# Patient Record
Sex: Female | Born: 1985 | Race: White | Hispanic: No | Marital: Married | Smoking: Never smoker
Health system: Southern US, Community
[De-identification: ages and names within clinical notes are randomized; demographics above are authoritative.]

## PROBLEM LIST (undated history)

## (undated) DIAGNOSIS — Z803 Family history of malignant neoplasm of breast: Secondary | ICD-10-CM

## (undated) DIAGNOSIS — F32A Depression, unspecified: Secondary | ICD-10-CM

## (undated) DIAGNOSIS — F419 Anxiety disorder, unspecified: Secondary | ICD-10-CM

## (undated) DIAGNOSIS — J45909 Unspecified asthma, uncomplicated: Secondary | ICD-10-CM

## (undated) DIAGNOSIS — Z8481 Family history of carrier of genetic disease: Secondary | ICD-10-CM

## (undated) DIAGNOSIS — I493 Ventricular premature depolarization: Secondary | ICD-10-CM

## (undated) DIAGNOSIS — G47419 Narcolepsy without cataplexy: Secondary | ICD-10-CM

## (undated) HISTORY — DX: Family history of malignant neoplasm of breast: Z80.3

## (undated) HISTORY — DX: Family history of carrier of genetic disease: Z84.81

## (undated) HISTORY — DX: Ventricular premature depolarization: I49.3

## (undated) HISTORY — DX: Unspecified asthma, uncomplicated: J45.909

## (undated) HISTORY — DX: Narcolepsy without cataplexy: G47.419

## (undated) HISTORY — PX: WISDOM TOOTH EXTRACTION: SHX21

---

## 2011-03-01 DIAGNOSIS — L409 Psoriasis, unspecified: Secondary | ICD-10-CM | POA: Insufficient documentation

## 2012-02-05 DIAGNOSIS — F32A Depression, unspecified: Secondary | ICD-10-CM | POA: Insufficient documentation

## 2012-02-05 DIAGNOSIS — F419 Anxiety disorder, unspecified: Secondary | ICD-10-CM | POA: Insufficient documentation

## 2013-07-10 ENCOUNTER — Ambulatory Visit: Payer: Self-pay

## 2013-07-10 LAB — URINALYSIS, COMPLETE
BILIRUBIN, UR: NEGATIVE
Glucose,UR: NEGATIVE mg/dL (ref 0–75)
KETONE: NEGATIVE
LEUKOCYTE ESTERASE: NEGATIVE
Nitrite: NEGATIVE
Ph: 7 (ref 4.5–8.0)
Protein: NEGATIVE
Specific Gravity: 1.005 (ref 1.003–1.030)
Squamous Epithelial: NONE SEEN
WBC UR: NONE SEEN /HPF (ref 0–5)

## 2013-07-10 LAB — WET PREP, GENITAL

## 2013-07-10 LAB — PREGNANCY, URINE: Pregnancy Test, Urine: NEGATIVE m[IU]/mL

## 2013-07-11 LAB — GC/CHLAMYDIA PROBE AMP

## 2014-11-03 ENCOUNTER — Encounter: Payer: Self-pay | Admitting: Obstetrics and Gynecology

## 2014-11-03 ENCOUNTER — Other Ambulatory Visit: Payer: Self-pay | Admitting: *Deleted

## 2014-11-03 ENCOUNTER — Ambulatory Visit (INDEPENDENT_AMBULATORY_CARE_PROVIDER_SITE_OTHER): Payer: 59 | Admitting: Obstetrics and Gynecology

## 2014-11-03 ENCOUNTER — Telehealth: Payer: Self-pay | Admitting: Obstetrics and Gynecology

## 2014-11-03 VITALS — BP 128/84 | HR 73 | Ht 66.0 in | Wt 128.0 lb

## 2014-11-03 DIAGNOSIS — B373 Candidiasis of vulva and vagina: Secondary | ICD-10-CM

## 2014-11-03 DIAGNOSIS — B3731 Acute candidiasis of vulva and vagina: Secondary | ICD-10-CM

## 2014-11-03 DIAGNOSIS — R300 Dysuria: Secondary | ICD-10-CM

## 2014-11-03 LAB — POCT URINALYSIS DIPSTICK
Bilirubin, UA: NEGATIVE
Glucose, UA: NEGATIVE
Ketones, UA: NEGATIVE
Leukocytes, UA: NEGATIVE
Nitrite, UA: NEGATIVE
Protein, UA: NEGATIVE
RBC UA: NEGATIVE
Spec Grav, UA: 1.01
UROBILINOGEN UA: 0.2
pH, UA: 7

## 2014-11-03 MED ORDER — FLUCONAZOLE 150 MG PO TABS: 150.0000 mg | ORAL_TABLET | Freq: Once | ORAL | Status: DC

## 2014-11-03 MED ORDER — NORETHINDRONE-ETH ESTRADIOL 1-35 MG-MCG PO TABS: 1.0000 | ORAL_TABLET | Freq: Every day | ORAL | Status: DC

## 2014-11-03 NOTE — Telephone Encounter (Signed)
PT WAS HERE EARLIER AND FORGOT TO MENTION SHE NEEDS A REFILL ON HER BC.

## 2014-11-03 NOTE — Patient Instructions (Signed)
Thank you for enrolling in MyChart. Please follow the instructions below to securely access your online medical record. MyChart allows you to send messages to your doctor, view your test results, renew your prescriptions, schedule appointments, and more.  How Do I Sign Up? 1. In your Internet browser, go to [email or web address] 2. Click on the New  User? link in the Sign In box.  3. Enter your MyChart Access Code exactly as it appears below. You will not need to use this code after you have completed the sign-up process. If you do not sign up before the expiration date, you must request a new code. MyChart Access Code: [email or web address] Expires: 01/02/2015 11:57 AM  4. Enter the last four digits of your Social Security Number (xxxx) and Date of Birth (mm/dd/yyyy) as indicated and click Next. You will be taken to the next sign-up page. 5. Create a MyChart ID. This will be your MyChart login ID and cannot be changed, so think of one that is secure and easy to remember. 6. Create a MyChart password. You can change your password at any time. 7. Enter your Password Reset Question and Answer and click Next. This can be used at a later time if you forget your password.  8. Select your communication preference, and if applicable enter your e-mail address. You will receive e-mail notification when new information is available in MyChart by choosing to receive e-mail notifications and filling in your e-mail. 9. Click Sign In. You can now view your medical record.   Additional Information If you have questions, you can email [email or web address] or call [phone] to talk to our MyChart staff. Remember, MyChart is NOT to be used for urgent needs. For medical emergencies, dial 911. Monilial Vaginitis Vaginitis in a soreness, swelling and redness (inflammation) of the vagina and vulva. Monilial vaginitis is not a sexually transmitted infection. CAUSES  Yeast vaginitis is  caused by yeast (candida) that is normally found in your vagina. With a yeast infection, the candida has overgrown in number to a point that upsets the chemical balance. SYMPTOMS   White, thick vaginal discharge.  Swelling, itching, redness and irritation of the vagina and possibly the lips of the vagina (vulva).  Burning or painful urination.  Painful intercourse. DIAGNOSIS  Things that may contribute to monilial vaginitis are:  Postmenopausal and virginal states.  Pregnancy.  Infections.  Being tired, sick or stressed, especially if you had monilial vaginitis in the past.  Diabetes. Good control will help lower the chance.  Birth control pills.  Tight fitting garments.  Using bubble bath, feminine sprays, douches or deodorant tampons.  Taking certain medications that kill germs (antibiotics).  Sporadic recurrence can occur if you become ill. TREATMENT  Your caregiver will give you medication.  There are several kinds of anti monilial vaginal creams and suppositories specific for monilial vaginitis. For recurrent yeast infections, use a suppository or cream in the vagina 2 times a week, or as directed.  Anti-monilial or steroid cream for the itching or irritation of the vulva may also be used. Get your caregiver's permission.  Painting the vagina with methylene blue solution may help if the monilial cream does not work.  Eating yogurt may help prevent monilial vaginitis. HOME CARE INSTRUCTIONS   Finish all medication as prescribed.  Do not have sex until treatment is completed or after your caregiver tells you it is okay.  Take warm sitz baths.  Do not douche.  Do not use tampons,  especially scented ones.  Wear cotton underwear.  Avoid tight pants and panty hose.  Tell your sexual partner that you have a yeast infection. They should go to their caregiver if they have symptoms such as mild rash or itching.  Your sexual partner should be treated as well if  your infection is difficult to eliminate.  Practice safer sex. Use condoms.  Some vaginal medications cause latex condoms to fail. Vaginal medications that harm condoms are:  Cleocin cream.  Butoconazole (Femstat).  Terconazole (Terazol) vaginal suppository.  Miconazole (Monistat) (may be purchased over the counter). SEEK MEDICAL CARE IF:   You have a temperature by mouth above 102 F (38.9 C).  The infection is getting worse after 2 days of treatment.  The infection is not getting better after 3 days of treatment.  You develop blisters in or around your vagina.  You develop vaginal bleeding, and it is not your menstrual period.  You have pain when you urinate.  You develop intestinal problems.  You have pain with sexual intercourse. Document Released: 11/15/2004 Document Revised: 04/30/2011 Document Reviewed: 07/30/2008 [facility] Patient Information 2015 [place], [place]. This information is not intended to replace advice given to you by your health care provider. Make sure you discuss any questions you have with your health care provider.

## 2014-11-03 NOTE — Progress Notes (Signed)
Subjective:     Patient ID: Tanya Glenn, female   DOB: 1985-09-09, 29 y.o.   MRN: [id number]  HPI Recurrent vaginal burning x 1 month, had a yeas infection in the past but this feels different, not sexually active in 1 year  Review of Systems Vaginal burning with no increased discharge    Objective:   Physical Exam A&Ox4 Well groomed thin female in no current distress Pelvic exam: VULVA: normal appearing vulva with no masses, tenderness or lesions, vulvar erythema in labial folds, VAGINA: normal appearing vagina with normal color and discharge, no lesions, vaginal erythema throughout, CERVIX: cervical discharge present - creamy and scant, nulliparous os. Microscopic wet-mount exam shows negative for pathogens, normal epithelial cells, lactobacilli.     Assessment:     Yeast infection     Plan:     Diflucan  po now To schedule AE in 4-6 weeks  Kenzlei Runions Ines Bloomer, CNM

## 2014-11-03 NOTE — Telephone Encounter (Signed)
Sent rx to pharmacy

## 2014-11-12 ENCOUNTER — Telehealth: Payer: Self-pay | Admitting: Obstetrics and Gynecology

## 2014-11-12 NOTE — Telephone Encounter (Signed)
Called pt notified what MNB would like to do, she will be by today after 4:15

## 2014-11-12 NOTE — Telephone Encounter (Signed)
Have her stop by and lets get a nuswab and send it in- can have pt self obtain- explain that I am looking for yeast that is resistant to the medication

## 2014-11-12 NOTE — Telephone Encounter (Signed)
Pt has taken medication for yeast infection and she said she is still burning. She said she knows something is not right.

## 2014-11-12 NOTE — Telephone Encounter (Signed)
pls advise

## 2014-11-15 ENCOUNTER — Other Ambulatory Visit: Payer: Self-pay | Admitting: Obstetrics and Gynecology

## 2014-11-18 ENCOUNTER — Encounter: Payer: Self-pay | Admitting: Obstetrics and Gynecology

## 2014-11-23 ENCOUNTER — Telehealth: Payer: Self-pay | Admitting: *Deleted

## 2014-11-23 NOTE — Telephone Encounter (Signed)
Notified pt of results 

## 2014-11-23 NOTE — Telephone Encounter (Signed)
-----   Message from Ulyses Amor, [place] sent at 11/23/2014  2:07 PM EDT ----- Please let her know Nuswab was negative for yeast and BV, I believe symptoms are related to other cause- like reaction to toilet paper, soaps, excessive wiping, allergies, etc- can be hard to tell at times.

## 2015-01-05 ENCOUNTER — Other Ambulatory Visit: Payer: Self-pay | Admitting: Obstetrics and Gynecology

## 2015-01-05 ENCOUNTER — Ambulatory Visit (INDEPENDENT_AMBULATORY_CARE_PROVIDER_SITE_OTHER): Payer: 59 | Admitting: Obstetrics and Gynecology

## 2015-01-05 ENCOUNTER — Encounter: Payer: Self-pay | Admitting: Obstetrics and Gynecology

## 2015-01-05 VITALS — BP 122/82 | HR 77 | Ht 66.5 in | Wt 125.5 lb

## 2015-01-05 DIAGNOSIS — R61 Generalized hyperhidrosis: Secondary | ICD-10-CM

## 2015-01-05 DIAGNOSIS — Z01419 Encounter for gynecological examination (general) (routine) without abnormal findings: Secondary | ICD-10-CM

## 2015-01-05 MED ORDER — CLOBETASOL PROPIONATE 0.05 % EX SHAM: 1.0000 "application " | MEDICATED_SHAMPOO | CUTANEOUS | Status: DC | PRN

## 2015-01-05 NOTE — Progress Notes (Signed)
Subjective:   Tanya NakaiSheema Glenn is a 29 y.o. No obstetric history on file. Caucasian female here for a routine well-woman exam.  Patient's last menstrual period was 10/22/2014.    Current complaints: excessive sweating in groin and under arms x 1 year , H/O low TSH on screening      Does need & desire labs  Social History: Sexual: heterosexual Marital Status: single Living situation: alone Occupation: Teacher, early years/prepharmacist at [employer] Tobacco/alcohol: no tobacco use Illicit drugs: no history of illicit drug use  The following portions of the patient's history were reviewed and updated as appropriate: allergies, current medications, past family history, past medical history, past social history, past surgical history and problem list.  Past Medical History Past Medical History  Diagnosis Date  . Narcolepsy   . Asthma     Past Surgical History History reviewed. No pertinent past surgical history.  Gynecologic History No obstetric history on file.  Patient's last menstrual period was 10/22/2014. Contraception: abstinence and OCP (estrogen/progesterone) Last Pap: 2015. Results were: normal  Obstetric History OB History  No data available    Current Medications Current Outpatient Prescriptions on File Prior to Visit  Medication Sig Dispense Refill  . albuterol (PROVENTIL HFA;VENTOLIN HFA) 108 (90 BASE) MCG/ACT inhaler Inhale into the lungs every 6 (six) hours as needed for wheezing or shortness of breath.    . amphetamine-dextroamphetamine (ADDERALL XR) 5 MG 24 hr capsule Take 1 capsule by mouth daily.    . norethindrone-ethinyl estradiol 1/35 (ORTHO-NOVUM, NORTREL,CYCLAFEM) tablet Take 1 tablet by mouth daily. 1 Package 3  . Sodium Oxybate (XYREM) 500 MG/ML SOLN Take by mouth 2 (two) times daily.    Marland Kitchen. dexmethylphenidate (FOCALIN) 5 MG tablet Take 1 tablet by mouth as needed.    . fluconazole (DIFLUCAN) 150 MG tablet Take 1 tablet (150 mg total) by mouth once. Can take additional dose three days  later if symptoms persist (Patient not taking: Reported on 01/05/2015) 1 tablet 3   No current facility-administered medications on file prior to visit.    Review of Systems Patient denies any headaches, blurred vision, shortness of breath, chest pain, abdominal pain, problems with bowel movements, urination, or intercourse.  Objective:  BP 122/82 mmHg  Pulse 77  Ht 5' 6.5" (1.689 m)  Wt 125 lb 8 oz (56.926 kg)  BMI 19.95 kg/m2  LMP 10/22/2014 Physical Exam  General:  Well developed, well nourished, no acute distress. She is alert and oriented x3. Skin:  Warm and dry Neck:  Midline trachea, no thyromegaly or nodules Cardiovascular: Regular rate and rhythm, no murmur heard Lungs:  Effort normal, all lung fields clear to auscultation bilaterally Breasts:  No dominant palpable mass, retraction, or nipple discharge Abdomen:  Soft, non tender, no hepatosplenomegaly or masses Pelvic:  External genitalia is normal in appearance.  The vagina is normal in appearance. The cervix is bulbous, no CMT.  Thin prep pap is done with HR HPV cotesting. Uterus is felt to be normal size, shape, and contour.  No adnexal masses or tenderness noted. Extremities:  No swelling or varicosities noted Psych:  She has a normal mood and affect  Assessment:   Healthy well-woman exam; excessive sweating, ADD, Narcolepsy; OCP user; recurrent vaginal yeast  Plan:  Pap and labs obtained F/U 1 Year for AE, or sooner if needed  Melody Elissa LovettN Burr, CNM

## 2015-01-05 NOTE — Patient Instructions (Signed)
Place annual gynecologic exam patient instructions here.

## 2015-01-06 LAB — COMPREHENSIVE METABOLIC PANEL
A/G RATIO: 1.8 (ref 1.1–2.5)
ALBUMIN: 4.4 g/dL (ref 3.5–5.5)
ALT: 15 IU/L (ref 0–32)
AST: 20 IU/L (ref 0–40)
Alkaline Phosphatase: 45 IU/L (ref 39–117)
BILIRUBIN TOTAL: 0.2 mg/dL (ref 0.0–1.2)
BUN / CREAT RATIO: 9 (ref 8–20)
BUN: 9 mg/dL (ref 6–20)
CO2: 24 mmol/L (ref 18–29)
Calcium: 9.5 mg/dL (ref 8.7–10.2)
Chloride: 99 mmol/L (ref 97–106)
Creatinine, Ser: 0.95 mg/dL (ref 0.57–1.00)
GFR, EST AFRICAN AMERICAN: 93 mL/min/{1.73_m2} (ref >59)
GFR, EST NON AFRICAN AMERICAN: 81 mL/min/{1.73_m2} (ref >59)
Globulin, Total: 2.5 g/dL (ref 1.5–4.5)
Glucose: 89 mg/dL (ref 65–99)
POTASSIUM: 3.9 mmol/L (ref 3.5–5.2)
Sodium: 138 mmol/L (ref 136–144)
Total Protein: 6.9 g/dL (ref 6.0–8.5)

## 2015-01-06 LAB — LIPID PANEL
CHOLESTEROL TOTAL: 138 mg/dL (ref 100–199)
Chol/HDL Ratio: 2.2 ratio units (ref 0.0–4.4)
HDL: 63 mg/dL (ref >39)
LDL CALC: 66 mg/dL (ref 0–99)
Triglycerides: 47 mg/dL (ref 0–149)
VLDL CHOLESTEROL CAL: 9 mg/dL (ref 5–40)

## 2015-01-06 LAB — THYROID PANEL WITH TSH
Free Thyroxine Index: 2.9 (ref 1.2–4.9)
T3 UPTAKE RATIO: 25 % (ref 24–39)
T4 TOTAL: 11.4 ug/dL (ref 4.5–12.0)
TSH: 1.54 u[IU]/mL (ref 0.450–4.500)

## 2015-01-07 LAB — CYTOLOGY - PAP

## 2015-02-15 ENCOUNTER — Other Ambulatory Visit: Payer: Self-pay | Admitting: Obstetrics and Gynecology

## 2015-03-30 ENCOUNTER — Ambulatory Visit (INDEPENDENT_AMBULATORY_CARE_PROVIDER_SITE_OTHER): Payer: 59 | Admitting: Obstetrics and Gynecology

## 2015-03-30 VITALS — BP 128/93 | HR 69 | Wt 126.2 lb

## 2015-03-30 DIAGNOSIS — R300 Dysuria: Secondary | ICD-10-CM | POA: Diagnosis not present

## 2015-03-30 LAB — POCT URINALYSIS DIPSTICK
Bilirubin, UA: NEGATIVE
Blood, UA: NEGATIVE
Glucose, UA: NEGATIVE
Ketones, UA: NEGATIVE
LEUKOCYTES UA: NEGATIVE
NITRITE UA: NEGATIVE
PH UA: 7.5
PROTEIN UA: NEGATIVE
Spec Grav, UA: 1.01
Urobilinogen, UA: NEGATIVE

## 2015-03-30 NOTE — Progress Notes (Signed)
Patient ID: Tanya Glenn, female   DOB: [birth year], 30 y.o.   MRN: [id number] Pt workin. She states she feels supra-pubic pressure and twitching (like spasm) and burns when urinates. No vaginal discharge. Pt urinalysis is unremarkable but will send for culture. Urogesic tab. (1) to be taken 4x day as needed. To contact office on Friday for urine culture results if she has not heard anything from office.

## 2015-04-02 LAB — URINE CULTURE: Organism ID, Bacteria: NO GROWTH

## 2015-04-09 DIAGNOSIS — N760 Acute vaginitis: Secondary | ICD-10-CM | POA: Diagnosis not present

## 2015-04-09 DIAGNOSIS — B9689 Other specified bacterial agents as the cause of diseases classified elsewhere: Secondary | ICD-10-CM | POA: Diagnosis not present

## 2015-04-09 DIAGNOSIS — N898 Other specified noninflammatory disorders of vagina: Secondary | ICD-10-CM | POA: Diagnosis not present

## 2015-06-21 DIAGNOSIS — G47419 Narcolepsy without cataplexy: Secondary | ICD-10-CM | POA: Diagnosis not present

## 2015-07-04 ENCOUNTER — Other Ambulatory Visit: Payer: Self-pay | Admitting: Obstetrics and Gynecology

## 2015-07-04 DIAGNOSIS — B373 Candidiasis of vulva and vagina: Secondary | ICD-10-CM | POA: Diagnosis not present

## 2015-07-07 ENCOUNTER — Telehealth: Payer: Self-pay | Admitting: Obstetrics and Gynecology

## 2015-07-07 NOTE — Telephone Encounter (Signed)
Patient called stating she is waiting on a call from you.Thanks

## 2015-07-07 NOTE — Telephone Encounter (Signed)
Results are not back yet.  

## 2015-07-07 NOTE — Telephone Encounter (Signed)
Pt came in on 07/04/15 did self obtained swab do you see results???

## 2015-08-09 ENCOUNTER — Other Ambulatory Visit: Payer: Self-pay | Admitting: Obstetrics and Gynecology

## 2015-08-11 ENCOUNTER — Telehealth: Payer: Self-pay | Admitting: Obstetrics and Gynecology

## 2015-08-11 NOTE — Telephone Encounter (Signed)
PT SAID SHE IS STILL BURNING AND HURTING IN HER VAGINA. sHE SAID SHE GOT A SWAB ABOUT A MONTH AGO AND SHE IS VERY WORRIED SOMETHING IS WRONG. [facility]

## 2015-08-11 NOTE — Telephone Encounter (Signed)
Recommend coconut oil applied externally after showering, OK to put on call back list for workin appt or can see Dr Valentino Saxonherry for second opinion

## 2015-08-11 NOTE — Telephone Encounter (Signed)
Mel what would you like to do with this pt???

## 2015-08-16 ENCOUNTER — Encounter: Payer: Self-pay | Admitting: Obstetrics and Gynecology

## 2015-08-16 ENCOUNTER — Ambulatory Visit (INDEPENDENT_AMBULATORY_CARE_PROVIDER_SITE_OTHER): Payer: 59 | Admitting: Obstetrics and Gynecology

## 2015-08-16 VITALS — BP 141/82 | HR 80 | Ht 66.0 in | Wt 125.7 lb

## 2015-08-16 DIAGNOSIS — N9489 Other specified conditions associated with female genital organs and menstrual cycle: Secondary | ICD-10-CM

## 2015-08-16 DIAGNOSIS — B3731 Acute candidiasis of vulva and vagina: Secondary | ICD-10-CM

## 2015-08-16 DIAGNOSIS — N760 Acute vaginitis: Secondary | ICD-10-CM

## 2015-08-16 DIAGNOSIS — B373 Candidiasis of vulva and vagina: Secondary | ICD-10-CM

## 2015-08-16 MED ORDER — FLUCONAZOLE 150 MG PO TABS: 150.0000 mg | ORAL_TABLET | Freq: Once | ORAL | Status: DC

## 2015-08-16 NOTE — Progress Notes (Signed)
Subjective:     Patient ID: Tanya NakaiSheema Glenn, female   DOB: 06/16/1985, 30 y.o.   MRN: [id number]  HPI Ongoing sporadic and at times continuous vaginal burning, worse after wiping, sex and with tight clothing, at times can't wear underwear at all. Also reports return on menses in April, and is having them monthly now. Last treated yeast infection 3 weeks ago with OTC monistat which made things worse. Has went back and forth between yeast and BV. Very frustrated, as the burning is daily for months to some degree.   Review of Systems See above    Objective:   Physical Exam A&O x4 Well groomed female in mild distress-anxious Blood pressure 141/82, pulse 80, height 5\' 6"  (1.676 m), weight 125 lb 11.2 oz (57.017 kg), last menstrual period 07/18/2015. Pelvic exam: VULVA: vulvar tenderness and posterior introitus and entire hymen, vulvar hypopigmentation spotted around, VAGINA: pale with little moisture except thick discharge at cervix, WET MOUNT done - results: negative for pathogens, normal epithelial cells, lactobacilli, pH < 4.5.    Assessment:     Vulvar burning Yeast vaginitis Estrogen deficiency     Plan:     Diflucan 150mg  now, and repeat in 1 week if needed Stop OCPs Estrace cream externally nightly x1 month OK to continue with coconut oil as needed.  RTC 3 weeks for colpo  Harlow MaresMelody Shambley, CNM

## 2015-08-16 NOTE — Telephone Encounter (Signed)
Pt came in 08/16/15-ac

## 2015-09-07 ENCOUNTER — Telehealth: Payer: Self-pay | Admitting: Obstetrics and Gynecology

## 2015-09-07 NOTE — Telephone Encounter (Signed)
Pt called and she needs to have a discussion about her appt on Friday, she has a full blown yeast infection and she thinks that she doesn't need to have any infectionwhen she comes in so that you can get a good look at the lining, she wanted a call back to know what she needs to do.

## 2015-09-07 NOTE — Telephone Encounter (Signed)
I agree we need to reschedule appt until she is done with medication for 3 days.

## 2015-09-08 NOTE — Telephone Encounter (Signed)
Spoke with pt

## 2015-09-09 ENCOUNTER — Other Ambulatory Visit: Payer: Self-pay | Admitting: Obstetrics and Gynecology

## 2015-09-09 ENCOUNTER — Encounter: Payer: Self-pay | Admitting: Obstetrics and Gynecology

## 2015-09-09 ENCOUNTER — Ambulatory Visit (INDEPENDENT_AMBULATORY_CARE_PROVIDER_SITE_OTHER): Payer: 59 | Admitting: Obstetrics and Gynecology

## 2015-09-09 VITALS — BP 129/86 | HR 69 | Ht 66.0 in | Wt 124.1 lb

## 2015-09-09 DIAGNOSIS — B3731 Acute candidiasis of vulva and vagina: Secondary | ICD-10-CM

## 2015-09-09 DIAGNOSIS — Z113 Encounter for screening for infections with a predominantly sexual mode of transmission: Secondary | ICD-10-CM | POA: Diagnosis not present

## 2015-09-09 DIAGNOSIS — B373 Candidiasis of vulva and vagina: Secondary | ICD-10-CM | POA: Diagnosis not present

## 2015-09-09 NOTE — Progress Notes (Signed)
Subjective:     Patient ID: Tanya NakaiSheema Rauda, female   DOB: 06/02/1985, 30 y.o.   MRN: [id number]  HPI Reports yeast infection returned 5 days ago, with burning internally and externally, increased thick white d/c, no odor, no spotting. Has not taken diflucan since last week. Did have intercourse last week.  Review of Systems See above    Objective:   Physical Exam A&O x4 Well groomed thin female in no distress Blood pressure 129/86, pulse 69, height 5\' 6"  (1.676 m), weight 124 lb 1.6 oz (56.291 kg), last menstrual period 08/29/2015. Pelvic exam: normal external genitalia, vulva, vagina, cervix, uterus and adnexa, CERVIX: normal appearing cervix without discharge or lesions, cervical discharge present - white and thick, WET MOUNT done - results: negative for pathogens, normal epithelial cells, lactobacilli, vaginal pH is 4.5.NuSwab obtained and sent in    Assessment:     Vaginal yeast infection- recurrent     Plan:     gention violet dye applied to entire vagina, vulva and perineum-tolerated well RTC 1 week for next treatment.  Girl Schissler HoldenvilleShambley, CNM

## 2015-09-13 ENCOUNTER — Telehealth: Payer: Self-pay | Admitting: Vascular Surgery

## 2015-09-13 NOTE — Telephone Encounter (Signed)
Patient called 7/25 to schedule an appointment for a self referral for varicose veins. I was able to schedule the patient for 8/7.

## 2015-09-15 ENCOUNTER — Encounter: Payer: Self-pay | Admitting: Obstetrics and Gynecology

## 2015-09-16 ENCOUNTER — Encounter: Payer: Self-pay | Admitting: Obstetrics and Gynecology

## 2015-09-16 ENCOUNTER — Ambulatory Visit (INDEPENDENT_AMBULATORY_CARE_PROVIDER_SITE_OTHER): Payer: 59 | Admitting: Obstetrics and Gynecology

## 2015-09-16 VITALS — BP 120/77 | HR 86 | Wt 124.8 lb

## 2015-09-16 DIAGNOSIS — B379 Candidiasis, unspecified: Secondary | ICD-10-CM | POA: Diagnosis not present

## 2015-09-16 NOTE — Progress Notes (Signed)
Subjective:     Patient ID: Tanya Glenn, female   DOB: 05-Mar-1985, 30 y.o.   MRN: [id number]  HPI Seen 1 week ago for recurrent yeast infections and vulvar burning- treated with gention violet dye- here for second treatment  Review of Systems States mild internal itching 2 days ago, otherwise feeling better    Objective:   Physical Exam A&O x4 Well groomed female in no distress Blood pressure 120/77, pulse 86, weight 124 lb 12.8 oz (56.6 kg), last menstrual period 08/29/2015. Pelvic exam: normal external genitalia, vulva, vagina, cervix, uterus and adnexa. 80% improvement of tissue and scant clear d/c noted.    Assessment:     Yeast treatment    Plan:     gention violet dye applied (second treatment) to vaginal and cervix without difficulty RTC in 11 days for recheck.  Melody [place], CNM

## 2015-09-20 ENCOUNTER — Encounter: Payer: 59 | Admitting: Obstetrics and Gynecology

## 2015-09-21 ENCOUNTER — Other Ambulatory Visit: Payer: Self-pay | Admitting: *Deleted

## 2015-09-21 DIAGNOSIS — I83811 Varicose veins of right lower extremities with pain: Secondary | ICD-10-CM

## 2015-09-22 ENCOUNTER — Encounter: Payer: Self-pay | Admitting: Vascular Surgery

## 2015-09-26 ENCOUNTER — Ambulatory Visit (HOSPITAL_COMMUNITY)
Admission: RE | Admit: 2015-09-26 | Discharge: 2015-09-26 | Disposition: A | Discharge status: Home / Self Care | Payer: 59 | Source: Ambulatory Visit | Attending: Vascular Surgery | Admitting: Vascular Surgery

## 2015-09-26 ENCOUNTER — Ambulatory Visit (INDEPENDENT_AMBULATORY_CARE_PROVIDER_SITE_OTHER): Payer: 59 | Admitting: Vascular Surgery

## 2015-09-26 ENCOUNTER — Encounter: Payer: Self-pay | Admitting: Vascular Surgery

## 2015-09-26 VITALS — BP 111/87 | HR 76 | Ht 66.0 in | Wt 119.5 lb

## 2015-09-26 DIAGNOSIS — I83811 Varicose veins of right lower extremities with pain: Secondary | ICD-10-CM | POA: Insufficient documentation

## 2015-09-26 DIAGNOSIS — M79604 Pain in right leg: Secondary | ICD-10-CM | POA: Insufficient documentation

## 2015-09-26 NOTE — Progress Notes (Signed)
Subjective:     Patient ID: Tanya Glenn, female   DOB: 01/23/1986, 30 y.o.   MRN: [id number]  HPI this 30 year old female is self-referred because of right leg pain below the knee and occasional numbness. 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 She states she got good Results with relief of her discomfort in the posterior thigh area. About 6 months ago she developed pain in the right medial calf and states that this bulges significantly at times. She states that she occasionally will have some numbness in the right foot associated with this particularly at night. She states she is unable to stand in the shower because of the discomfort. It is affecting her ability to work she states. She has no history of back pain or nerve compression syndrome. She denies DVT thrombophlebitis stasis ulcers or bleeding. She does were elastic compression stockings at work with some help.  Past Medical History:  Diagnosis Date  . Asthma   . Narcolepsy     Social History  Substance Use Topics  . Smoking status: Never Smoker  . Smokeless tobacco: Never Used  . Alcohol use Yes    Family History  Problem Relation Age of Onset  . Heart disease Maternal Grandmother     Allergies  Allergen Reactions  . Sulfa Antibiotics Hives     Current Outpatient Prescriptions:  .  albuterol (PROVENTIL HFA;VENTOLIN HFA) 108 (90 BASE) MCG/ACT inhaler, Inhale into the lungs every 6 (six) hours as needed for wheezing or shortness of breath., Disp: , Rfl:  .  Clobetasol Propionate 0.05 % shampoo, Apply 1 application topically as needed., Disp: 118 mL, Rfl: 3 .  dexmethylphenidate (FOCALIN) 5 MG tablet, Take 5 mg by mouth daily., Disp: , Rfl:  .  Sodium Oxybate (XYREM) 500 MG/ML SOLN, Take by mouth 2 (two) times daily., Disp: , Rfl:  .   amphetamine-dextroamphetamine (ADDERALL XR) 5 MG 24 hr capsule, Take 1 capsule by mouth daily., Disp: , Rfl:  .  dexmethylphenidate (FOCALIN) 5 MG tablet, Take 1 tablet by mouth as needed., Disp: , Rfl:  .  fluconazole (DIFLUCAN) 150 MG tablet, Take 1 tablet (150 mg total) by mouth once. Can take additional dose three days later if symptoms persist (Patient not taking: Reported on 09/16/2015), Disp: 1 tablet, Rfl: 3  Vitals:   09/26/15 1330  BP: 111/87  Pulse: 76  SpO2: 100%  Weight: 119 lb 8 oz (54.2 kg)  Height:  (1.676 m)    Body mass index is 19.29 kg/m.          Review of Systems   denies chest pain, dyspnea on exertion, PND, orthopnea. Patient has narcolepsy and is on medications to help her sleep at night. Otherwise negative review of systems other than occasional asthma Objective:   Physical Exam BP 111/87 (BP Location: Left Arm, Patient Position: Sitting, Cuff Size: Large)   Pulse 76   Ht  (1.676 m)   Wt 119 lb 8 oz (54.2 kg)   LMP 08/29/2015   SpO2 100%   BMI 19.29 kg/m     Gen.-alert and oriented x3 in no apparent distress HEENT normal for age Lungs no rhonchi or wheezing Cardiovascular regular rhythm no murmurs carotid pulses 3+ palpable no bruits audible Abdomen soft nontender no palpable masses Musculoskeletal free of  major deformities Skin clear -  no rashes Neurologic normal Lower extremities 3+ femoral and dorsalis pedis pulses palpable bilaterally with no edema Right lower extremity carefully examined both anteriorly and posteriorly and no significant spider veins, reticular veins, bulging varicosities, hyperpigmentation, or other evidence of arterial or venous insufficiency.  Today I ordered a venous duplex exam the right leg which I reviewed and interpreted. There is no DVT. There is reflux in the deep venous system on the right and the common femoral vein and popliteal veins but there is no reflux in the superficial system. 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 Return to see me on a when necessary basis

## 2015-09-27 ENCOUNTER — Encounter: Payer: Self-pay | Admitting: Obstetrics and Gynecology

## 2015-09-27 ENCOUNTER — Ambulatory Visit (INDEPENDENT_AMBULATORY_CARE_PROVIDER_SITE_OTHER): Payer: 59 | Admitting: Obstetrics and Gynecology

## 2015-09-27 VITALS — BP 128/92 | HR 83 | Wt 122.2 lb

## 2015-09-27 DIAGNOSIS — N760 Acute vaginitis: Secondary | ICD-10-CM | POA: Diagnosis not present

## 2015-09-27 NOTE — Progress Notes (Signed)
Subjective:     Patient ID: Tanya NakaiSheema Glenn, female   DOB: 09/16/1985, 30 y.o.   MRN: [id number]  HPI Returning for retesting for recurrent yeast- reports menses last week, and itching returned- took a diflucan and symptoms didn't change.  Review of Systems See above    Objective:   Physical Exam A&O x4  well groomed female Blood pressure (!) 128/92, pulse 83, weight 122 lb 3.2 oz (55.4 kg), last menstrual period 09/19/2015. Pelvic exam: normal external genitalia, vulva, vagina, cervix, uterus and adnexa, VULVA: normal appearing vulva with no masses, tenderness or lesions, vulvar erythema at labia cleft, WET MOUNT done - results: negative for pathogens, normal epithelial cells.    Assessment:     Vaginitis gention violet treatment    Plan:     3rd gention violet treatment applied, instructed to restart estrace cream in 3 days externally. RTC 1 week  Tanya Glenn, [place]

## 2015-10-05 ENCOUNTER — Ambulatory Visit (INDEPENDENT_AMBULATORY_CARE_PROVIDER_SITE_OTHER): Payer: 59 | Admitting: Obstetrics and Gynecology

## 2015-10-05 ENCOUNTER — Encounter: Payer: Self-pay | Admitting: Obstetrics and Gynecology

## 2015-10-05 VITALS — BP 135/83 | HR 77 | Wt 124.7 lb

## 2015-10-05 DIAGNOSIS — B379 Candidiasis, unspecified: Secondary | ICD-10-CM

## 2015-10-05 NOTE — Progress Notes (Signed)
Subjective:     Patient ID: Tanya NakaiSheema Glenn, female   DOB: 01/05/1986, 30 y.o.   MRN: [id number]  HPI  Here for follow up exam for recurrent yeast infections, did third weekly treatment of gention violet last week. She states symptoms improved but still feels external irritation when washes or wipes. Denies discharge at this time.  Review of Systems Negative except stated above in HPI    Objective:   Physical Exam A&Ox4 Well groomed female in no distress Blood pressure 135/83, pulse 77, weight 124 lb 11.2 oz (56.6 kg), last menstrual period 09/19/2015. Pelvic exam: normal external genitalia, vulva, vagina, cervix, uterus and adnexa, WET MOUNT done - results: negative for pathogens, normal epithelial cells.    Assessment:     Reassured of normal findings, yeast infection resolved    Plan:     Patient still frustrated over symptoms.  Recommended continuing with estrogen cream as desired. RTC as needed.  Melody ChiliShambley, CNM

## 2015-10-31 ENCOUNTER — Telehealth: Payer: Self-pay | Admitting: Obstetrics and Gynecology

## 2015-10-31 NOTE — Telephone Encounter (Signed)
Called pt, she states the sx are coming back, she was tearful on the phone,"she states she cant keep coming in and having her vagina painted purple", she states she had been reading up on this condition and you can take diflucan 150mg , weekly, monthly??? pls advise

## 2015-10-31 NOTE — Telephone Encounter (Signed)
Pt called and Melody told her that if her symptoms returned to call her back and she has stated that they have returned so she would like a call back from you she knows Melody does not work on [employer]. She goes to work today from 12-8:30, if a RX will be called in she would like to get it before she went into to work since she will not be able to get it when she gets off, [facility].

## 2015-11-01 ENCOUNTER — Other Ambulatory Visit: Payer: Self-pay | Admitting: Obstetrics and Gynecology

## 2015-11-01 MED ORDER — FLUCONAZOLE 150 MG PO TABS: 150.0000 mg | ORAL_TABLET | Freq: Once | ORAL | 3 refills | Status: AC

## 2015-11-01 NOTE — Telephone Encounter (Signed)
Please let her know I sent in a prescription for the diflucan, and I still would only have her use when symptomatic. But there are refills.

## 2015-11-02 DIAGNOSIS — N762 Acute vulvitis: Secondary | ICD-10-CM | POA: Diagnosis not present

## 2015-11-02 DIAGNOSIS — N9489 Other specified conditions associated with female genital organs and menstrual cycle: Secondary | ICD-10-CM | POA: Diagnosis not present

## 2015-11-02 DIAGNOSIS — N898 Other specified noninflammatory disorders of vagina: Secondary | ICD-10-CM | POA: Diagnosis not present

## 2015-11-02 DIAGNOSIS — N760 Acute vaginitis: Secondary | ICD-10-CM | POA: Diagnosis not present

## 2015-11-08 ENCOUNTER — Telehealth: Payer: Self-pay | Admitting: Obstetrics and Gynecology

## 2015-11-08 ENCOUNTER — Other Ambulatory Visit: Payer: Self-pay | Admitting: Obstetrics and Gynecology

## 2015-11-08 MED ORDER — NORETHIN-ETH ESTRAD-FE BIPHAS 1 MG-10 MCG / 10 MCG PO TABS: 1.0000 | ORAL_TABLET | Freq: Every day | ORAL | 11 refills | Status: DC

## 2015-11-08 NOTE — Telephone Encounter (Signed)
Patient called stating she is ready to start birth control. She stated she discussed a low dose b/c with melody. She uses the [employer] [facility] [facility]. Thanks

## 2015-11-08 NOTE — Telephone Encounter (Signed)
I sent in RX for LoLoestrin- please see if she needs a discount card.

## 2015-11-08 NOTE — Telephone Encounter (Signed)
pls advise

## 2015-11-24 DIAGNOSIS — H52223 Regular astigmatism, bilateral: Secondary | ICD-10-CM | POA: Diagnosis not present

## 2015-11-24 DIAGNOSIS — H5203 Hypermetropia, bilateral: Secondary | ICD-10-CM | POA: Diagnosis not present

## 2016-02-16 ENCOUNTER — Ambulatory Visit: Payer: Self-pay | Admitting: Physician Assistant

## 2016-02-16 ENCOUNTER — Encounter: Payer: Self-pay | Admitting: Physician Assistant

## 2016-02-16 VITALS — BP 129/82 | HR 74 | Temp 98.3°F

## 2016-02-16 DIAGNOSIS — R21 Rash and other nonspecific skin eruption: Secondary | ICD-10-CM

## 2016-02-16 DIAGNOSIS — J4520 Mild intermittent asthma, uncomplicated: Secondary | ICD-10-CM | POA: Insufficient documentation

## 2016-02-16 MED ORDER — DEXAMETHASONE SODIUM PHOSPHATE 10 MG/ML IJ SOLN
10.0000 mg | Freq: Once | INTRAMUSCULAR | Status: AC
  Administered 2016-02-16: 10 mg via INTRAMUSCULAR

## 2016-02-16 NOTE — Progress Notes (Signed)
S: c/o rash on both forearms, small bumps, no pus or redness, used hydrocortisone without relief, then used nystatin/triamcinolone, sx for a week, today is the first day it looks improved, but feels like she has more places popping up, walks her dogs outside, no new exposures, does have hx of psoriasis  O: vitals wnl, nad, skin with small bumps on forearms, no redness pustules or drainage, ?if some at temple, no pustules, lungs c ta ,cv rrr  A: rash  P: trial of decadron 10mg  IM, if improving with this and rash gets worse after 3 days will order steroid pack

## 2016-02-16 NOTE — Addendum Note (Signed)
Addended by: Faythe GheeFISHER, SUSAN W on: 02/16/2016 11:28 AM   Modules accepted: Orders

## 2016-06-26 DIAGNOSIS — G47419 Narcolepsy without cataplexy: Secondary | ICD-10-CM | POA: Diagnosis not present

## 2016-08-06 ENCOUNTER — Other Ambulatory Visit: Payer: Self-pay | Admitting: Obstetrics and Gynecology

## 2016-09-27 ENCOUNTER — Ambulatory Visit (INDEPENDENT_AMBULATORY_CARE_PROVIDER_SITE_OTHER): Payer: 59 | Admitting: Obstetrics and Gynecology

## 2016-09-27 ENCOUNTER — Other Ambulatory Visit: Payer: Self-pay | Admitting: Obstetrics and Gynecology

## 2016-09-27 ENCOUNTER — Encounter: Payer: Self-pay | Admitting: Obstetrics and Gynecology

## 2016-09-27 VITALS — BP 126/84 | HR 66 | Ht 67.5 in | Wt 133.5 lb

## 2016-09-27 DIAGNOSIS — Z113 Encounter for screening for infections with a predominantly sexual mode of transmission: Secondary | ICD-10-CM | POA: Diagnosis not present

## 2016-09-27 DIAGNOSIS — B373 Candidiasis of vulva and vagina: Secondary | ICD-10-CM

## 2016-09-27 DIAGNOSIS — Z01419 Encounter for gynecological examination (general) (routine) without abnormal findings: Secondary | ICD-10-CM | POA: Diagnosis not present

## 2016-09-27 DIAGNOSIS — B3731 Acute candidiasis of vulva and vagina: Secondary | ICD-10-CM

## 2016-09-27 MED ORDER — FLUTICASONE-SALMETEROL 115-21 MCG/ACT IN AERO: 2.0000 | INHALATION_SPRAY | Freq: Two times a day (BID) | RESPIRATORY_TRACT | 12 refills | Status: DC

## 2016-09-27 NOTE — Patient Instructions (Signed)
Preventive Care 18-39 Years, Female Preventive care refers to lifestyle choices and visits with your health care provider that can promote health and wellness. What does preventive care include?  A yearly physical exam. This is also called an annual well check.  Dental exams once or twice a year.  Routine eye exams. Ask your health care provider how often you should have your eyes checked.  Personal lifestyle choices, including: ? Daily care of your teeth and gums. ? Regular physical activity. ? Eating a healthy diet. ? Avoiding tobacco and drug use. ? Limiting alcohol use. ? Practicing safe sex. ? Taking vitamin and mineral supplements as recommended by your health care provider. What happens during an annual well check? The services and screenings done by your health care provider during your annual well check will depend on your age, overall health, lifestyle risk factors, and family history of disease. Counseling Your health care provider may ask you questions about your:  Alcohol use.  Tobacco use.  Drug use.  Emotional well-being.  Home and relationship well-being.  Sexual activity.  Eating habits.  Work and work Statistician.  Method of birth control.  Menstrual cycle.  Pregnancy history.  Screening You may have the following tests or measurements:  Height, weight, and BMI.  Diabetes screening. This is done by checking your blood sugar (glucose) after you have not eaten for a while (fasting).  Blood pressure.  Lipid and cholesterol levels. These may be checked every 5 years starting at age 38.  Skin check.  Hepatitis C blood test.  Hepatitis B blood test.  Sexually transmitted disease (STD) testing.  BRCA-related cancer screening. This may be done if you have a family history of breast, ovarian, tubal, or peritoneal cancers.  Pelvic exam and Pap test. This may be done every 3 years starting at age 38. Starting at age 30, this may be done  every 5 years if you have a Pap test in combination with an HPV test.  Discuss your test results, treatment options, and if necessary, the need for more tests with your health care provider. Vaccines Your health care provider may recommend certain vaccines, such as:  Influenza vaccine. This is recommended every year.  Tetanus, diphtheria, and acellular pertussis (Tdap, Td) vaccine. You may need a Td booster every 10 years.  Varicella vaccine. You may need this if you have not been vaccinated.  HPV vaccine. If you are 39 or younger, you may need three doses over 6 months.  Measles, mumps, and rubella (MMR) vaccine. You may need at least one dose of MMR. You may also need a second dose.  Pneumococcal 13-valent conjugate (PCV13) vaccine. You may need this if you have certain conditions and were not previously vaccinated.  Pneumococcal polysaccharide (PPSV23) vaccine. You may need one or two doses if you smoke cigarettes or if you have certain conditions.  Meningococcal vaccine. One dose is recommended if you are age 68-21 years and a first-year college student living in a residence hall, or if you have one of several medical conditions. You may also need additional booster doses.  Hepatitis A vaccine. You may need this if you have certain conditions or if you travel or work in places where you may be exposed to hepatitis A.  Hepatitis B vaccine. You may need this if you have certain conditions or if you travel or work in places where you may be exposed to hepatitis B.  Haemophilus influenzae type b (Hib) vaccine. You may need this  if you have certain risk factors.  Talk to your health care provider about which screenings and vaccines you need and how often you need them. This information is not intended to replace advice given to you by your health care provider. Make sure you discuss any questions you have with your health care provider. Document Released: 04/03/2001 Document Revised:  10/26/2015 Document Reviewed: 12/07/2014 Elsevier Interactive Patient Education  2017 Elsevier Inc.  

## 2016-09-27 NOTE — Progress Notes (Signed)
Subjective:   Tanya Glenn is a 31 y.o. G0P0000 Caucasian female here for a routine well-woman exam.  No LMP recorded. Patient is not currently having periods (Reason: Oral contraceptives).    Current complaints: treated for yeast with 3 diflucan 2 weeks ago, want to be sure it is gone, also request STI screening. PCP: none       does desire labs  Social History: Sexual: heterosexual Marital Status: divorced Living situation: alone Occupation: Teacher, early years/pre at [employer] Tobacco/alcohol: no tobacco use Illicit drugs: no history of illicit drug use  The following portions of the patient's history were reviewed and updated as appropriate: allergies, current medications, past family history, past medical history, past social history, past surgical history and problem list.  Past Medical History Past Medical History:  Diagnosis Date  . Asthma   . Narcolepsy     Past Surgical History History reviewed. No pertinent surgical history.  Gynecologic History G0P0000  No LMP recorded. Patient is not currently having periods (Reason: Oral contraceptives). Contraception: abstinence Last Pap: 2016. Results were: normal   Obstetric History OB History  Gravida Para Term Preterm AB Living  0 0 0 0 0 0  SAB TAB Ectopic Multiple Live Births  0 0 0 0          Current Medications Current Outpatient Prescriptions on File Prior to Visit  Medication Sig Dispense Refill  . albuterol (PROVENTIL HFA;VENTOLIN HFA) 108 (90 BASE) MCG/ACT inhaler Inhale into the lungs every 6 (six) hours as needed for wheezing or shortness of breath.    . Clobetasol Propionate 0.05 % shampoo APPLY TO AFFECTED AREA(S) AS NEEDED. 118 mL 3  . dexmethylphenidate (FOCALIN) 5 MG tablet Take 5 mg by mouth daily.    . Norethindrone-Ethinyl Estradiol-Fe Biphas (LO LOESTRIN FE) 1 MG-10 MCG / 10 MCG tablet Take 1 tablet by mouth daily. 1 Package 11  . Sodium Oxybate (XYREM) 500 MG/ML SOLN Take by mouth 2 (two) times daily.    Marland Kitchen  amphetamine-dextroamphetamine (ADDERALL XR) 5 MG 24 hr capsule Take 1 capsule by mouth daily.     No current facility-administered medications on file prior to visit.     Review of Systems Patient denies any headaches, blurred vision, shortness of breath, chest pain, abdominal pain, problems with bowel movements, urination, or intercourse.  Objective:  BP 126/84   Pulse 66   Ht 5' 7.5" (1.715 m)   Wt 133 lb 8 oz (60.6 kg)   BMI 20.60 kg/m  Physical Exam  General:  Well developed, well nourished, no acute distress. She is alert and oriented x3. Skin:  Warm and dry Neck:  Midline trachea, no thyromegaly or nodules Cardiovascular: Regular rate and rhythm, no murmur heard Lungs:  Effort normal, all lung fields clear to auscultation bilaterally Breasts:  No dominant palpable mass, retraction, or nipple discharge Abdomen:  Soft, non tender, no hepatosplenomegaly or masses Pelvic:  External genitalia is normal in appearance.  The vagina is normal in appearance. The cervix is bulbous, no CMT.  Thin prep pap is done with HR HPV cotesting. Uterus is felt to be normal size, shape, and contour.  No adnexal masses or tenderness noted. Microscopic wet-mount exam shows negative for pathogens, normal epithelial cells, lactobacilli, monilia. Extremities:  No swelling or varicosities noted Psych:  She has a normal mood and affect  Assessment:   Healthy well-woman exam Vaginal yeast OCP user STI screening  Plan:  advir inhaler refilled. Treated vaginal yeast with gention violet and pt will self  treat next week, following up her in 2 weeks if not resolved. F/U 1 year for AE, or sooner if needed                                         Othel Dicostanzo Suzan NailerN Narcissus Detwiler, CNM

## 2016-09-28 ENCOUNTER — Other Ambulatory Visit: Payer: Self-pay | Admitting: Obstetrics and Gynecology

## 2016-09-28 LAB — COMPREHENSIVE METABOLIC PANEL
A/G RATIO: 1.6 (ref 1.2–2.2)
ALK PHOS: 49 IU/L (ref 39–117)
ALT: 21 IU/L (ref 0–32)
AST: 24 IU/L (ref 0–40)
Albumin: 4.6 g/dL (ref 3.5–5.5)
BUN/Creatinine Ratio: 13 (ref 9–23)
BUN: 13 mg/dL (ref 6–20)
Bilirubin Total: 0.3 mg/dL (ref 0.0–1.2)
CHLORIDE: 101 mmol/L (ref 96–106)
CO2: 25 mmol/L (ref 20–29)
Calcium: 9.7 mg/dL (ref 8.7–10.2)
Creatinine, Ser: 0.98 mg/dL (ref 0.57–1.00)
GFR calc non Af Amer: 77 mL/min/{1.73_m2} (ref >59)
GFR, EST AFRICAN AMERICAN: 88 mL/min/{1.73_m2} (ref >59)
GLOBULIN, TOTAL: 2.8 g/dL (ref 1.5–4.5)
GLUCOSE: 97 mg/dL (ref 65–99)
POTASSIUM: 4.3 mmol/L (ref 3.5–5.2)
SODIUM: 140 mmol/L (ref 134–144)
TOTAL PROTEIN: 7.4 g/dL (ref 6.0–8.5)

## 2016-09-28 LAB — HEPATITIS PANEL, ACUTE
Hep A IgM: NEGATIVE
Hep B C IgM: NEGATIVE
Hep C Virus Ab: <0.1 {s_co_ratio} (ref 0.0–0.9)
Hepatitis B Surface Ag: NEGATIVE

## 2016-09-28 LAB — RPR: RPR Ser Ql: NONREACTIVE

## 2016-09-28 LAB — HIV ANTIBODY (ROUTINE TESTING W REFLEX): HIV SCREEN 4TH GENERATION: NONREACTIVE

## 2016-10-02 ENCOUNTER — Telehealth: Payer: Self-pay | Admitting: Obstetrics and Gynecology

## 2016-10-02 NOTE — Telephone Encounter (Signed)
The patient called and lvm stating that her medication that Melody prescribed is not covered by her insurance, The patient would like to speak with Amy in regards to the issue so she can get an alternative medication that will be covered by her insurance. The patient did not disclose any other information. Please advise.

## 2016-10-03 NOTE — Telephone Encounter (Signed)
Is she talking about the LoLoEstrin? If so has she tried the discount card?

## 2016-10-03 NOTE — Telephone Encounter (Signed)
pls advise

## 2016-10-04 ENCOUNTER — Emergency Department
Admission: EM | Admit: 2016-10-04 | Discharge: 2016-10-04 | Disposition: A | Discharge status: Home / Self Care | Payer: 59 | Attending: Emergency Medicine | Admitting: Emergency Medicine

## 2016-10-04 ENCOUNTER — Emergency Department: Payer: 59

## 2016-10-04 ENCOUNTER — Other Ambulatory Visit: Payer: Self-pay | Admitting: *Deleted

## 2016-10-04 ENCOUNTER — Encounter: Payer: Self-pay | Admitting: Emergency Medicine

## 2016-10-04 DIAGNOSIS — R030 Elevated blood-pressure reading, without diagnosis of hypertension: Secondary | ICD-10-CM | POA: Diagnosis not present

## 2016-10-04 DIAGNOSIS — J45909 Unspecified asthma, uncomplicated: Secondary | ICD-10-CM | POA: Diagnosis not present

## 2016-10-04 DIAGNOSIS — R0789 Other chest pain: Secondary | ICD-10-CM | POA: Diagnosis not present

## 2016-10-04 DIAGNOSIS — R002 Palpitations: Secondary | ICD-10-CM | POA: Insufficient documentation

## 2016-10-04 DIAGNOSIS — R0602 Shortness of breath: Secondary | ICD-10-CM | POA: Insufficient documentation

## 2016-10-04 DIAGNOSIS — I493 Ventricular premature depolarization: Secondary | ICD-10-CM | POA: Diagnosis not present

## 2016-10-04 DIAGNOSIS — R079 Chest pain, unspecified: Secondary | ICD-10-CM | POA: Diagnosis not present

## 2016-10-04 DIAGNOSIS — Z79899 Other long term (current) drug therapy: Secondary | ICD-10-CM | POA: Insufficient documentation

## 2016-10-04 LAB — HEPATIC FUNCTION PANEL
ALBUMIN: 4.2 g/dL (ref 3.5–5.0)
ALT: 21 U/L (ref 14–54)
AST: 32 U/L (ref 15–41)
Alkaline Phosphatase: 42 U/L (ref 38–126)
Bilirubin, Direct: <0.1 mg/dL — ABNORMAL LOW (ref 0.1–0.5)
TOTAL PROTEIN: 7.6 g/dL (ref 6.5–8.1)
Total Bilirubin: 0.6 mg/dL (ref 0.3–1.2)

## 2016-10-04 LAB — CBC WITH DIFFERENTIAL/PLATELET
Basophils Absolute: 0.1 10*3/uL (ref 0–0.1)
Basophils Relative: 1 %
EOS ABS: 0.1 10*3/uL (ref 0–0.7)
Eosinophils Relative: 2 %
HCT: 42.9 % (ref 35.0–47.0)
HEMOGLOBIN: 14.8 g/dL (ref 12.0–16.0)
LYMPHS ABS: 2.9 10*3/uL (ref 1.0–3.6)
Lymphocytes Relative: 44 %
MCH: 31.7 pg (ref 26.0–34.0)
MCHC: 34.5 g/dL (ref 32.0–36.0)
MCV: 91.9 fL (ref 80.0–100.0)
MONOS PCT: 6 %
Monocytes Absolute: 0.4 10*3/uL (ref 0.2–0.9)
NEUTROS ABS: 3.2 10*3/uL (ref 1.4–6.5)
NEUTROS PCT: 47 %
Platelets: 186 10*3/uL (ref 150–440)
RBC: 4.67 MIL/uL (ref 3.80–5.20)
RDW: 12.5 % (ref 11.5–14.5)
WBC: 6.6 10*3/uL (ref 3.6–11.0)

## 2016-10-04 LAB — BASIC METABOLIC PANEL
Anion gap: 8 (ref 5–15)
BUN: 17 mg/dL (ref 6–20)
CO2: 27 mmol/L (ref 22–32)
Calcium: 9.3 mg/dL (ref 8.9–10.3)
Chloride: 103 mmol/L (ref 101–111)
Creatinine, Ser: 1 mg/dL (ref 0.44–1.00)
Glucose, Bld: 110 mg/dL — ABNORMAL HIGH (ref 65–99)
POTASSIUM: 3.3 mmol/L — AB (ref 3.5–5.1)
SODIUM: 138 mmol/L (ref 135–145)

## 2016-10-04 LAB — TROPONIN I: Troponin I: <0.03 ng/mL (ref <0.03)

## 2016-10-04 LAB — FIBRIN DERIVATIVES D-DIMER (ARMC ONLY): FIBRIN DERIVATIVES D-DIMER (ARMC): 142.08 (ref 0.00–499.00)

## 2016-10-04 LAB — BRAIN NATRIURETIC PEPTIDE: B Natriuretic Peptide: 20 pg/mL (ref 0.0–100.0)

## 2016-10-04 LAB — HCG, QUANTITATIVE, PREGNANCY

## 2016-10-04 LAB — CYTOLOGY - PAP

## 2016-10-04 LAB — MAGNESIUM: Magnesium: 2.1 mg/dL (ref 1.7–2.4)

## 2016-10-04 MED ORDER — POTASSIUM CHLORIDE CRYS ER 20 MEQ PO TBCR
20.0000 meq | EXTENDED_RELEASE_TABLET | Freq: Once | ORAL | Status: AC
  Administered 2016-10-04: 20 meq via ORAL
  Filled 2016-10-04: qty 1

## 2016-10-04 MED ORDER — FLUTICASONE-SALMETEROL 250-50 MCG/DOSE IN AEPB: 1.0000 | INHALATION_SPRAY | Freq: Every day | RESPIRATORY_TRACT | 3 refills | Status: DC

## 2016-10-04 NOTE — ED Provider Notes (Signed)
[facility] Emergency Department Provider Note  ____________________________________________   I have reviewed the triage vital signs and the nursing notes.   HISTORY  Chief Complaint Chest fluttering/tightness  History limited by: Not Limited   HPI Tanya Glenn is a 31 y.o. female who presents to the emergency department today because of concern for test the tightness and fluttering. She states that the symptoms started this morning. She does have a history of asthma so initially was thinking that the chest tightness could be due to her asthma. She then however started developing fluttering. She stated it was very intermittent. Was lasted very short time. 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 The patient is on birth control. States she did drive to the beach the other weekend. She denies however any swelling. Denies any history of blood clots or family history of blood clots.   Past Medical History:  Diagnosis Date  . Asthma   . Narcolepsy     Patient Active Problem List   Diagnosis Date Noted  . Asthma, mild intermittent 02/16/2016  . Leg pain, right 09/26/2015  . Situational mixed anxiety and depressive disorder 02/05/2012  . 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 No pertinent surgical history.  Prior to Admission medications   Medication Sig Start Date End Date Taking? Authorizing Provider  albuterol (PROVENTIL HFA;VENTOLIN HFA) 108 (90 BASE) MCG/ACT inhaler Inhale into the lungs every 6 (six) hours as needed for wheezing or shortness of breath.    [provider]  amphetamine-dextroamphetamine (ADDERALL XR) 5 MG 24 hr capsule Take 1 capsule by mouth daily. 02/02/14 02/02/15  [provider]  amphetamine-dextroamphetamine (ADDERALL XR) 5 MG 24 hr capsule Take 5 mg by mouth daily.    [provider]  Clobetasol Propionate 0.05 % shampoo APPLY TO AFFECTED  AREA(S) AS NEEDED. 08/06/16   Shambley, Melody N, CNM  dexmethylphenidate (FOCALIN) 5 MG tablet Take 5 mg by mouth daily.    [provider]  Fluticasone-Salmeterol (ADVAIR DISKUS) 250-50 MCG/DOSE AEPB Inhale 1 puff into the lungs daily. 10/04/16   Shambley, Melody N, CNM  LO LOESTRIN FE 1 MG-10 MCG / 10 MCG tablet TAKE 1 TABLET BY MOUTH DAILY. 09/28/16   Shambley, Melody N, CNM  Sodium Oxybate (XYREM) 500 MG/ML SOLN Take by mouth 2 (two) times daily.    [provider]    Allergies Sulfa antibiotics  Family History  Problem Relation Age of Onset  . Heart disease Maternal Grandmother     Social History Social History  Substance Use Topics  . Smoking status: Never Smoker  . Smokeless tobacco: Never Used  . Alcohol use Yes    Review of Systems Constitutional: No fever/chills Eyes: No visual changes. ENT: No sore throat. Cardiovascular: Positive for chest tightness, fluttering. Respiratory: Positive for shortness of breath. Gastrointestinal: No abdominal pain.  No nausea, no vomiting.  No diarrhea.   Genitourinary: Negative for dysuria. Musculoskeletal: Negative for back pain. Skin: Negative for rash. Neurological: Negative for headaches, focal weakness or numbness.  ____________________________________________   PHYSICAL EXAM:  VITAL SIGNS: ED Triage Vitals  Enc Vitals Group     BP 10/04/16 1431 (!) 139/94     Pulse Rate 10/04/16 1431 92     Resp 10/04/16 1431 19     Temp 10/04/16 1431 98.3 F (36.8 C)     Temp Source 10/04/16 1431 Oral     SpO2 10/04/16 1431 100 %  Weight 10/04/16 1431 133 lb (60.3 kg)     Height 10/04/16 1431 5' 6.5" (1.689 m)     Head Circumference --      Peak Flow --      Pain Score 10/04/16 1430 0   Constitutional: Alert and oriented. Well appearing and in no distress. Eyes: Conjunctivae are normal.  ENT   Head: Normocephalic and atraumatic.   Nose: No congestion/rhinnorhea.   Mouth/Throat: Mucous membranes  are moist.   Neck: No stridor. Hematological/Lymphatic/Immunilogical: No cervical lymphadenopathy. Cardiovascular: Normal rate, regular rhythm.  No murmurs, rubs, or gallops.  Respiratory: Normal respiratory effort without tachypnea nor retractions. Breath sounds are clear and equal bilaterally. No wheezes/rales/rhonchi. Gastrointestinal: Soft and non tender. No rebound. No guarding.  Genitourinary: Deferred Musculoskeletal: Normal range of motion in all extremities. No lower extremity edema. Neurologic:  Normal speech and language. No gross focal neurologic deficits are appreciated.  Skin:  Skin is warm, dry and intact. No rash noted. Psychiatric: Mood and affect are normal. Speech and behavior are normal. Patient exhibits appropriate insight and judgment.  ____________________________________________    LABS (pertinent positives/negatives)  Labs Reviewed  BASIC METABOLIC PANEL - Abnormal; Notable for the following:       Result Value   Potassium 3.3 (*)    Glucose, Bld 110 (*)    All other components within normal limits  HEPATIC FUNCTION PANEL - Abnormal; Notable for the following:    Bilirubin, Direct <0.1 (*)    All other components within normal limits  TROPONIN I  BRAIN NATRIURETIC PEPTIDE  CBC WITH DIFFERENTIAL/PLATELET  HCG, QUANTITATIVE, PREGNANCY  FIBRIN DERIVATIVES D-DIMER ([facility] ONLY)  MAGNESIUM     ____________________________________________   EKG  I, Phineas SemenGraydon Orman Matsumura, attending physician, personally viewed and interpreted this EKG  EKG Time: 1428 Rate: 77 Rhythm: normal sinus rhythm Axis: normal Intervals: qtc 432 QRS: narrow ST changes: no st elevation Impression: normal ekg  ____________________________________________    RADIOLOGY  CXR   IMPRESSION:  No acute pulmonary process identified.    ____________________________________________   PROCEDURES  Procedures  ____________________________________________   INITIAL IMPRESSION  / ASSESSMENT AND PLAN / ED COURSE  Pertinent labs & imaging results that were available during my care of the patient were reviewed by me and considered in my medical decision making (see chart for details).  Patient presented to the emergency department today with concerns for chest tightness and fluttering. While here we were able to see on the monitor some PVCs which the patient correlated to her fluttering. Her potassium was a little bit low. They wonder if this might be triggering PVCs. She was given potassium repletion here in the emergency Department. Otherwise blood work without concerning findings. Patient low score for heart score. I do not think that the patient's symptoms represent ACS. Will discharge. Will give PCP information.  ____________________________________________   FINAL CLINICAL IMPRESSION(S) / ED DIAGNOSES  Final diagnoses:  Palpitation  PVC (premature ventricular contraction)     Note: This dictation was prepared with Dragon dictation. Any transcriptional errors that result from this process are unintentional     Phineas SemenGoodman, Kipling Graser, MD 10/04/16 1754

## 2016-10-04 NOTE — Telephone Encounter (Signed)
Pt wanted advair diskus sent in

## 2016-10-04 NOTE — ED Notes (Signed)
Pt discharged home after verbalizing understanding of discharge instructions; nad noted. 

## 2016-10-04 NOTE — ED Triage Notes (Signed)
Pt presents with chest tightness to central chest. 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 She states that she sometimes has chest tightness as she has asthma, but that it was accompanied by a "flutter" feeling. She reports that the tightness has resolved, but she still feels the fluttery feeling. Pt alert & oriented with NAD noted.,

## 2016-10-04 NOTE — Discharge Instructions (Signed)
Please seek medical attention for any high fevers, chest pain, shortness of breath, change in behavior, persistent vomiting, bloody stool or any other new or concerning symptoms.  

## 2016-10-08 ENCOUNTER — Ambulatory Visit (INDEPENDENT_AMBULATORY_CARE_PROVIDER_SITE_OTHER): Payer: 59 | Admitting: Family Medicine

## 2016-10-08 ENCOUNTER — Encounter: Payer: Self-pay | Admitting: Family Medicine

## 2016-10-08 ENCOUNTER — Telehealth: Payer: Self-pay | Admitting: Cardiovascular Disease

## 2016-10-08 VITALS — BP 120/86 | HR 85 | Temp 98.6°F | Ht 66.0 in | Wt 135.6 lb

## 2016-10-08 DIAGNOSIS — J4520 Mild intermittent asthma, uncomplicated: Secondary | ICD-10-CM

## 2016-10-08 DIAGNOSIS — E876 Hypokalemia: Secondary | ICD-10-CM | POA: Diagnosis not present

## 2016-10-08 DIAGNOSIS — R002 Palpitations: Secondary | ICD-10-CM | POA: Diagnosis not present

## 2016-10-08 LAB — BASIC METABOLIC PANEL
BUN: 15 mg/dL (ref 6–23)
CO2: 28 meq/L (ref 19–32)
CREATININE: 1.04 mg/dL (ref 0.40–1.20)
Calcium: 9.3 mg/dL (ref 8.4–10.5)
Chloride: 102 mEq/L (ref 96–112)
GFR: 65.12 mL/min (ref >60.00)
GLUCOSE: 98 mg/dL (ref 70–99)
Potassium: 4 mEq/L (ref 3.5–5.1)
Sodium: 136 mEq/L (ref 135–145)

## 2016-10-08 LAB — TSH: TSH: 1.69 u[IU]/mL (ref 0.35–4.50)

## 2016-10-08 NOTE — Progress Notes (Signed)
Marikay Alar, MD Phone: [phone]  Tanya Glenn is a 31 y.o. female who presents today for new patient visit.  Palpitations: Patient was seen last week in the emergency room for palpitations. She developed some chest tightness during the day while at work and was evaluated by some of the staff in the ICU at the hospital who found PVCs and blood pressure 150/100. They noted no wheezing at that time. She was then evaluated in the emergency room. Her workup was remarkable for a slightly low potassium at 3.3. Otherwise unremarkable. She notes the tightness is improved though continues to intermittently feel a fluttering in her chest. Notes it comes on very quickly as a jolt and then goes away. She's not taking any Adderall. It does not bother her if she is up and moving around. She notes no excessive caffeine intake. No alcohol intake. She's not been excessively using her albuterol. She does have a history of asthma though only uses her albuterol with exercise. She had not used her albuterol that day. Patient reports she does have narcolepsy though has not been taking any of her Adderall.  Active Ambulatory Problems    Diagnosis Date Noted  . Leg pain, right 09/26/2015  . 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 Situational mixed anxiety and depressive disorder 02/05/2012  . Asthma, mild intermittent 02/16/2016  . Palpitations 10/08/2016  . Hypokalemia 10/08/2016   Resolved Ambulatory Problems    Diagnosis Date Noted  . No Resolved Ambulatory Problems   Past Medical History:  Diagnosis Date  . Asthma   . Narcolepsy     Family History  Problem Relation Age of Onset  . Heart disease Maternal Grandmother     Social History   Social History  . Marital status: Single    Spouse name: N/A  . Number of children: N/A  . Years of education: N/A   Occupational History  . Not on file.   Social History Main Topics  . Smoking status: Never Smoker  . Smokeless tobacco: Never Used  .  Alcohol use Yes  . Drug use: No  . Sexual activity: Yes    Birth control/ protection: Pill   Other Topics Concern  . Not on file   Social History Narrative  . No narrative on file    ROS  General:  Negative for nexplained weight loss, fever Skin: Negative for new or changing mole, sore that won't heal HEENT: Negative for trouble hearing, trouble seeing, ringing in ears, mouth sores, hoarseness, change in voice, dysphagia. CV:  Positive for palpitations, Negative for chest pain, dyspnea, edema Resp: Negative for cough, dyspnea, hemoptysis GI: Negative for nausea, vomiting, diarrhea, constipation, abdominal pain, melena, hematochezia. GU: Negative for dysuria, incontinence, urinary hesitance, hematuria, vaginal or penile discharge, polyuria, sexual difficulty, lumps in testicle or breasts MSK: Negative for muscle cramps or aches, joint pain or swelling Neuro: Negative for headaches, weakness, numbness, dizziness, passing out/fainting Psych: Negative for depression, anxiety, memory problems  Objective  Physical Exam Vitals:   10/08/16 1423  BP: 120/86  Pulse: 85  Temp: 98.6 F (37 C)  SpO2: 99%    BP Readings from Last 3 Encounters:  10/08/16 120/86  10/04/16 124/86  09/27/16 126/84   Wt Readings from Last 3 Encounters:  10/08/16 135 lb 9.6 oz (61.5 kg)  10/04/16 133 lb (60.3 kg)  09/27/16 133 lb 8 oz (60.6 kg)    Physical Exam  Constitutional: No distress.  HENT:  Head: Normocephalic and atraumatic.  Cardiovascular: Normal rate,  regular rhythm and normal heart sounds.   Pulmonary/Chest: Effort normal and breath sounds normal.  Abdominal: Soft. Bowel sounds are normal. She exhibits no distension. There is no tenderness. There is no rebound and no guarding.  Musculoskeletal: She exhibits no edema.  Neurological: She is alert. Gait normal.  Skin: Skin is warm and dry. She is not diaphoretic.  Psychiatric: Mood and affect normal.     Assessment/Plan:    Palpitations Patient with intermittent palpitations that seem consistent with PVCs. PVCs seen in the ED on the monitor. She notes her symptoms have improved to some degree though continues to have some intermittent palpitations. We will recheck her potassium. We'll check a TSH. We'll refer to cardiology. She will avoid taking Adderall. She'll use her albuterol only if needed. She was to start on Advair though she would like to hold off on this until she is evaluated by cardiology. Given return precautions.  Asthma, mild intermittent Patient with mild intermittent asthma. Only bothers her with exercise. She's had to use her albuterol slightly more when exercising recently. She was to start on Advair given this slight worsening though reports that she would like to hold off on this until she is evaluated by cardiology.  Hypokalemia Noted on lab work from the ED. No known reason to have hypokalemia. Will recheck today.   Orders Placed This Encounter  Procedures  . Basic Metabolic Panel (BMET)  . TSH  . Ambulatory referral to Cardiology    Referral Priority:   Routine    Referral Type:   Consultation    Referral Reason:   Specialty Services Required    Requested Specialty:   Cardiology    Number of Visits Requested:   1    No orders of the defined types were placed in this encounter.    Marikay Alar, MD [facility]

## 2016-10-08 NOTE — Telephone Encounter (Signed)
New patient routine referral from Dr. Birdie Sons for palpitations .   Patient only wants Dr. Mariah Milling and is scheduled for October.Added to wait list    She was told in ED that she was having PVC's and would like to know if its ok to wait this long for an appt.  Patient would like much sooner if needed and available.  Please advise.

## 2016-10-08 NOTE — Assessment & Plan Note (Signed)
Patient with mild intermittent asthma. Only bothers her with exercise. She's had to use her albuterol slightly more when exercising recently. She was to start on Advair given this slight worsening though reports that she would like to hold off on this until she is evaluated by cardiology.

## 2016-10-08 NOTE — Assessment & Plan Note (Signed)
Noted on lab work from the ED. No known reason to have hypokalemia. Will recheck today.

## 2016-10-08 NOTE — Patient Instructions (Signed)
Nice to see you. We'll recheck some lab work today and get you to see cardiology for your palpitations. If you have persistent palpitations or develop persistent chest pain or trouble breathing please seek medical attention.

## 2016-10-08 NOTE — Assessment & Plan Note (Signed)
Patient with intermittent palpitations that seem consistent with PVCs. PVCs seen in the ED on the monitor. She notes her symptoms have improved to some degree though continues to have some intermittent palpitations. We will recheck her potassium. We'll check a TSH. We'll refer to cardiology. She will avoid taking Adderall. She'll use her albuterol only if needed. She was to start on Advair though she would like to hold off on this until she is evaluated by cardiology. Given return precautions.

## 2016-10-09 ENCOUNTER — Telehealth: Payer: Self-pay | Admitting: *Deleted

## 2016-10-09 NOTE — Telephone Encounter (Signed)
See result note.  

## 2016-10-09 NOTE — Telephone Encounter (Signed)
Pt requested lab results  Pt contact [phone]

## 2016-10-09 NOTE — Telephone Encounter (Signed)
A 2 week monitor would help expedite the workup for PVCs, chest pain, palpitations If severe, could wear a 2 day holter TG

## 2016-10-10 ENCOUNTER — Encounter: Payer: 59 | Admitting: Obstetrics and Gynecology

## 2016-10-10 ENCOUNTER — Encounter: Payer: Self-pay | Admitting: Nurse Practitioner

## 2016-10-10 ENCOUNTER — Ambulatory Visit (INDEPENDENT_AMBULATORY_CARE_PROVIDER_SITE_OTHER): Payer: 59 | Admitting: Nurse Practitioner

## 2016-10-10 ENCOUNTER — Ambulatory Visit (INDEPENDENT_AMBULATORY_CARE_PROVIDER_SITE_OTHER): Payer: 59

## 2016-10-10 ENCOUNTER — Other Ambulatory Visit: Payer: Self-pay

## 2016-10-10 VITALS — BP 104/72 | HR 68 | Ht 66.0 in | Wt 133.5 lb

## 2016-10-10 DIAGNOSIS — R0789 Other chest pain: Secondary | ICD-10-CM

## 2016-10-10 DIAGNOSIS — E876 Hypokalemia: Secondary | ICD-10-CM

## 2016-10-10 DIAGNOSIS — I493 Ventricular premature depolarization: Secondary | ICD-10-CM | POA: Diagnosis not present

## 2016-10-10 LAB — ECHOCARDIOGRAM COMPLETE
HEIGHTINCHES: 66 in
WEIGHTICAEL: 2136 [oz_av]

## 2016-10-10 NOTE — Patient Instructions (Signed)
Testing/Procedures: Your physician has requested that you have an echocardiogram. Echocardiography is a painless test that uses sound waves to create images of your heart. It provides your doctor with information about the size and shape of your heart and how well your heart's chambers and valves are working. This procedure takes approximately one hour. There are no restrictions for this procedure.    Follow-Up: Your physician recommends that you schedule a follow-up appointment in: 6-8 weeks with Dr. Mariah Milling.  It was a pleasure seeing you today here in the office. Please do not hesitate to give [place] a call back if you have any further questions. [phone]  Alamosa Cellar RN, BSN

## 2016-10-10 NOTE — Progress Notes (Signed)
Cardiology Clinic Note   Patient Name: Tanya Glenn Date of Encounter: 10/10/2016  Primary Care Provider:  Glori Luis, MD Primary Cardiologist:  Will f/u w/ T. Mariah Milling, MD   Patient Profile    31 year old female with a history of asthma, narcolepsy, and palpitations, who presents for evaluation related to symptomatic PVCs.  Past Medical History    Past Medical History:  Diagnosis Date  . Asthma   . Narcolepsy   . Symptomatic PVCs    Past Surgical History:  Procedure Laterality Date  . WISDOM TOOTH EXTRACTION      Allergies  Allergies  Allergen Reactions  . Sulfa Antibiotics Hives    History of Present Illness    31 year old female with a history of asthma, narcolepsy, and palpitations.  Historically, Asthma has been treated w/ PRN albuterol, though she was recently placed on Advair Diskus in the setting of some degree of dyspnea with regular exercise. She also has history of narcolepsy and is prescribed Adderall but only rarely takes this. She says that she is typically able to get a good night sleep on Xyrem, and thus daytime somnolence is less of an issue.  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  She was seen by primary care with lab work at that time including nl bmet, CRP, and Vit D (reviewed in Care Everywhere today).  ECG at the time was normal and she was advised that palpitations were likely benign.  In 11/2013, she was found to have a mildly elevated FT4 but TSH and FT3 were nl (checked in the setting of hair loss and diarrhea).  Between 2014 and last week, she noted only a rare, isolated palpitation described as fluttering in the chest, lasting just a second or so, w/o other associated symptoms.  On Thursday 8/16, however, she was @ work and noted more frequent palpitations associated with mild chest tightness and lightheadedness.  She works @ [employer] and asked one of the ICU NPs to listen to her heart, @ which point it was  noted that she was having ectopy.  B/c she felt poorly, she was eval in the ED where she was noted to have frequent, symptomatic PVCs.  Potassium was found to be low @ 3.3.  Mg was nl @ 2.1.  D dimer and troponin were nl.  ECG was w/o ST/T changes.  After potassium supplementation, she was discharged from the ED.  She subsequently f/u with her PCP on 8/20 with follow up labs revealing a K of 4.0 and a nl TSH @ 1.69.    Since her ER visit, she has been more intentional about eating potassium rich foods.  She has continued to have intermittent, isolated palpitations, occurring about once every 20 mins w/o associated symptoms.  She has not had any further chest tightness and has continued to exercise as per her usual routine (light weight training 4+x/wk).  She cannot think of anything that might have triggered PVC's or resulted in hypokalemia on 8/16.  Though she is Rx Adderall, she does not routinely use it, and did not take any that day.    Home Medications    Prior to Admission medications   Medication Sig Start Date End Date Taking? Authorizing Provider  albuterol (PROVENTIL HFA;VENTOLIN HFA) 108 (90 BASE) MCG/ACT inhaler Inhale into the lungs every 6 (six) hours as needed for wheezing or shortness of breath.   Yes [provider]  amphetamine-dextroamphetamine (ADDERALL XR) 5 MG 24 hr capsule Take 1  capsule by mouth daily. 02/02/14 10/10/16 Yes [provider]  amphetamine-dextroamphetamine (ADDERALL XR) 5 MG 24 hr capsule Take 5 mg by mouth daily.   Yes [provider]  Clobetasol Propionate 0.05 % shampoo APPLY TO AFFECTED AREA(S) AS NEEDED. 08/06/16  Yes Shambley, Melody N, CNM  dexmethylphenidate (FOCALIN) 5 MG tablet Take 5 mg by mouth daily.   Yes [provider]  Fluticasone-Salmeterol (ADVAIR DISKUS) 250-50 MCG/DOSE AEPB Inhale 1 puff into the lungs daily. 10/04/16  Yes Shambley, Melody N, CNM  LO LOESTRIN FE 1 MG-10 MCG / 10 MCG tablet TAKE 1 TABLET BY MOUTH  DAILY. 09/28/16  Yes Shambley, Melody N, CNM  Sodium Oxybate (XYREM) 500 MG/ML SOLN Take by mouth 2 (two) times daily.   Yes [provider]    Family History    Family History  Problem Relation Age of Onset  . Heart disease Maternal Grandmother   . Stroke Maternal Grandmother   . Hyperlipidemia Father   Her parents are alive and well.  She has a younger sister that is alive and well.  Social History    Social History   Social History  . Marital status: Single    Spouse name: N/A  . Number of children: N/A  . Years of education: N/A   Occupational History  . Pharmacist    Social History Main Topics  . Smoking status: Never Smoker  . Smokeless tobacco: Never Used  . Alcohol use Yes     Comment: occasional drink on the weekend.  . Drug use: No  . Sexual activity: Yes    Birth control/ protection: Pill   Other Topics Concern  . Not on file   Social History Narrative   Lives in [place].  Exercises - wt training - 4+ x /wk.       Review of Systems    General:  No chills, fever, night sweats or weight changes.  Cardiovascular:  +++ chest tightness in the setting of palpitations and PVC's noted in ED on 8/16.  No dyspnea on exertion, edema, orthopnea, palpitations, paroxysmal nocturnal dyspnea. Dermatological: No rash, lesions/masses Respiratory: No cough, Occasional dyspnea with exercise in the setting of asthma history. Urologic: No hematuria, dysuria Abdominal:   No nausea, vomiting, diarrhea, bright red blood per rectum, melena, or hematemesis Neurologic:  No visual changes, wkns, changes in mental status. All other systems reviewed and are otherwise negative except as noted above.  Physical Exam    VS:  BP 104/72 (BP Location: Left Arm, Patient Position: Sitting, Cuff Size: Normal)   Pulse 68   Ht 5\' 6"  (1.676 m)   Wt 133 lb 8 oz (60.6 kg)   BMI 21.55 kg/m  , BMI Body mass index is 21.55 kg/m. GEN: Well nourished, well developed, in no acute distress.    HEENT: normal.  Neck: Supple, no JVD, carotid bruits, or masses. Cardiac: RRR, no murmurs, rubs, or gallops. No clubbing, cyanosis, edema.  Radials/DP/PT 2+ and equal bilaterally.  Respiratory:  Respirations regular and unlabored, clear to auscultation bilaterally. GI: Soft, nontender, nondistended, BS + x 4. MS: no deformity or atrophy. Skin: warm and dry, no rash. Neuro:  Strength and sensation are intact. Psych: Normal affect.  Accessory Clinical Findings    ECG - RSR, 70, ? LAE, no acute st/t changes.  Assessment & Plan   1.  Palpitations/Symptomatic PVC's:  Pt with a h/o intermittent palpitations dating back to late 2014 with increased frequency and symptoms last week on 8/16.  She was noted to have PVC's on monitoring in the ED and these were associated with a fluttering sensation, lightheadedness, and chest tightness.  ER eval was notable for hypokalemia w/ a K of 3.3.  D dimer and troponin were nl.  ECG was nl.  Since KCl supplementation in the ED, she has been more intentional about eating potassium rich foods @ home.  F/u bmet on 8/20 showed K of 4.0.  TSH was nl 8/20 as well.  She has noted less frequent, isolated palpitations/fluttering, occurring about 3x/hr since her ER visit.  There are no associated symptoms.  ECG today is normal.  I will obtain a 2D echo to r/o structural abnormalities that might account for PVCs.  We did discuss the potential role of monitoring, though given documentation that she was having PVCs in the ED,  monitoring would only serve to show PVC burden, which based on symptoms, is quite low (once every 20 mins or so by her report).  As a result, we will defer monitoring for now but can reconsider if symptoms worsen.  We discussed the role of  blocker therapy but given such a low burden of symptoms currently, youth, h/o narcolepsy, and asthma, I don't think that there is a role for  blockers - as they would be poorly tolerated.  AAD therapy would be reserved  for >10K PVC's a day, and based on symptoms, this is not the case here.  She will continue to avoid stimulants and is only having one caffeinated beverage a day, which should be fine.  She is nervous about using either albuterol or advair.  Plan f/u with Dr. Dena Billet to re-eval symptoms.  2.  Chest tightness:  In setting of palpitations.  ECG and trop nl in setting of chest tightness.  No FH of premature CAD.  She is active and continues to exercise w/o significant limitations or symptoms.  Checking echo as above.  If nl, would not pursue any further ischemic eval.  3.  Narcolepsy:  Stable.  She is mostly able to avoid using Adderall if she gets a good night sleep.  She is followed by neurology.  4.  Asthma:  No active wheezing today.  It sounds like she does have some exercise induced symptoms and she was recently Rx advair.  She hasn't started this yet b/c she is fearful that it might contribute to PVCs.  5.  Hypokalemia:  K 3.3 in ED.  Normalized by 8/20.  She is including potassium rich foods in her diet.  Hypokalemia appears to be the only identifiable trigger for her PVC's on 8/16.  At this point, she does not require supplementation.  6.  Disposition:  F/u echo.  F/u with Dr. Mariah Milling as planned on 10/1 or sooner if necessary.  Nicolasa Ducking, NP 10/10/2016, 11:52 AM

## 2016-10-10 NOTE — Telephone Encounter (Signed)
Spoke with patient about PVCs and discussed monitors that we could order. She states that she has appointment today with NP. Let her know that we would just wait and see her today and see if they still want a monitor. She was agreeable with this plan and has no further questions at this time.

## 2016-10-11 ENCOUNTER — Telehealth: Payer: Self-pay | Admitting: *Deleted

## 2016-10-11 NOTE — Telephone Encounter (Signed)
-----   Message from Ok Anis, NP sent at 10/10/2016  6:04 PM EDT ----- Normal heart pumping function and wall motion.  Only mildly leaky tricuspid valve, which is not likely to be contributing to PVC's/palpitations.  Overall a very reassuring study.

## 2016-10-11 NOTE — Telephone Encounter (Signed)
Reviewed results in detail with patient and she had some questions regarding the low numbers listed below. Let her know that I would route message to provider for further explanation of the numbers and then give her a call back. She was appreciative for the call and had no further questions at this time.   Left ventricle              Value     Reference LV ID, ED, PLAX chordal     (L)   37  mm    43 - 52 LV fx shortening, PLAX chordal  (L)   27  %    >=29 Mitral deceleration time     (L)   123  ms    150 - 230

## 2016-10-12 ENCOUNTER — Telehealth: Payer: Self-pay | Admitting: Nurse Practitioner

## 2016-10-12 NOTE — Telephone Encounter (Signed)
Left voicemail message for patient to call back regarding follow up questions.

## 2016-10-12 NOTE — Telephone Encounter (Signed)
With other recordings being normal there is no evidence of restrictive heart disease or diastolic dysfunction (stiff heart with changes in filling).  Thus taken by themselves, these numbers simply reflect someone who has a smaller heart, which for a ? of her size, is acceptable.  I did have Dr. Kirke Corin take a second look at her echo and he agreed.   Left ventricle              Value     Reference LV ID, ED, PLAX chordal     (L)   37  mm    43 - 52 LV fx shortening, PLAX chordal  (L)   27  %    >=29 Mitral deceleration time     (L)   123  ms    150 - 230

## 2016-10-12 NOTE — Telephone Encounter (Signed)
Reviewed [place] information with patient and she verbalized understanding with no further questions at this time.

## 2016-10-17 ENCOUNTER — Ambulatory Visit (INDEPENDENT_AMBULATORY_CARE_PROVIDER_SITE_OTHER): Payer: 59 | Admitting: Obstetrics and Gynecology

## 2016-10-17 ENCOUNTER — Encounter: Payer: Self-pay | Admitting: Obstetrics and Gynecology

## 2016-10-17 VITALS — BP 128/82 | HR 68 | Ht 66.5 in | Wt 131.6 lb

## 2016-10-17 DIAGNOSIS — B373 Candidiasis of vulva and vagina: Secondary | ICD-10-CM | POA: Diagnosis not present

## 2016-10-17 DIAGNOSIS — B3731 Acute candidiasis of vulva and vagina: Secondary | ICD-10-CM

## 2016-10-17 MED ORDER — BECLOMETHASONE DIPROP HFA 40 MCG/ACT IN AERB: 2.0000 | INHALATION_SPRAY | Freq: Two times a day (BID) | RESPIRATORY_TRACT | 2 refills | Status: DC

## 2016-10-17 NOTE — Progress Notes (Signed)
Subjective:     Patient ID: Tanya Glenn, female   DOB: 04/29/1985, 31 y.o.   MRN: [id number]  HPI Seen and treated for vaginal yeast infection on 10/04/16. States she is still irritated and itching. Has been self treating with gention violet at home weekly, last dose 10/10/16  Review of Systems Negative except stated above in HPI    Objective:   Physical Exam A&Ox4 Well groomed female in no distress Blood pressure 128/82, pulse 68, height 5' 6.5" (1.689 m), weight 131 lb 9.6 oz (59.7 kg). Pelvic exam: VULVA: vulvar erythema at introitus, VAGINA: vaginal erythema scattered, vaginal discharge - white and thin, WET MOUNT done - results: negative for pathogens, normal epithelial cells, lactobacilli.      Assessment:     Vaginal yeast infection    Plan:     Gynezole-1 sample given and instructed on use. RTC in 2 weeks for recheck or as needed.  Tejasvi Brissett MansfieldShambley, CNM

## 2016-10-31 ENCOUNTER — Encounter: Payer: Self-pay | Admitting: Obstetrics and Gynecology

## 2016-10-31 ENCOUNTER — Ambulatory Visit (INDEPENDENT_AMBULATORY_CARE_PROVIDER_SITE_OTHER): Payer: 59 | Admitting: Obstetrics and Gynecology

## 2016-10-31 ENCOUNTER — Other Ambulatory Visit: Payer: Self-pay | Admitting: *Deleted

## 2016-10-31 VITALS — BP 128/74 | HR 99 | Wt 132.4 lb

## 2016-10-31 DIAGNOSIS — N898 Other specified noninflammatory disorders of vagina: Secondary | ICD-10-CM

## 2016-10-31 DIAGNOSIS — Z79899 Other long term (current) drug therapy: Secondary | ICD-10-CM

## 2016-10-31 MED ORDER — NORETHIN ACE-ETH ESTRAD-FE 1-20 MG-MCG(24) PO CAPS: 1.0000 | ORAL_CAPSULE | Freq: Every day | ORAL | 6 refills | Status: DC

## 2016-10-31 MED ORDER — CLINDAMYCIN PHOSPHATE (1 DOSE) 2 % VA CREA: 1.0000 | TOPICAL_CREAM | Freq: Two times a day (BID) | VAGINAL | 6 refills | Status: DC

## 2016-10-31 NOTE — Progress Notes (Signed)
Subjective:     Patient ID: Tanya NakaiSheema Swanger, female   DOB: 06/18/1985, 31 y.o.   MRN: [id number]  HPI  Previously seen two weeks ago and treated for yeast infection. States felt much better for a week and now has some external irritation. No discharge. Is in last week of OCPs. And if everything is normal today, desires increasing estrogen dose in OCPs.    Review of Systems Negative except stated above    Objective:   Physical Exam A&Ox4 Well groomed female Vitals:   10/31/16 0936  Weight: 132 lb 6.4 oz (60.1 kg)     Pelvic exam: normal external genitalia, vulva, vagina, cervix, uterus and adnexa, WET MOUNT done - results: negative for pathogens, normal epithelial cells. Assessment:     Yeast infection resolved. OCP management    Plan:     Will increase OCP to 20mcg as previously discussed. OK to use refill clindesse as needed. RTC as needed.  Bharat Antillon Manchester CenterShambley, CNM

## 2016-11-14 ENCOUNTER — Encounter: Payer: Self-pay | Admitting: Obstetrics and Gynecology

## 2016-11-14 ENCOUNTER — Other Ambulatory Visit: Payer: Self-pay | Admitting: Obstetrics and Gynecology

## 2016-11-14 MED ORDER — NORETHIN ACE-ETH ESTRAD-FE 1-20 MG-MCG(24) PO TABS: 1.0000 | ORAL_TABLET | Freq: Every day | ORAL | 11 refills | Status: DC

## 2016-11-14 MED ORDER — BUTOCONAZOLE NITRATE (1 DOSE) 2 % VA CREA: 1.0000 | TOPICAL_CREAM | VAGINAL | 4 refills | Status: DC | PRN

## 2016-11-16 NOTE — Progress Notes (Signed)
Cardiology Office Note  Date:  11/19/2016   ID:  Tanya Glenn, DOB 05-28-1985, MRN [id number]  PCP:  Glori Luis, MD   Chief Complaint  Patient presents with  . other    follow up after ECHO. Meds reviewed verbally with patient.     HPI:  31 year old female with a history of  asthma,  narcolepsy, palpitations, PVCs who presents for evaluation related to symptomatic PVCs.  In follow-up today she reports that she has been doing well recently  Echocardiogram reviewed with her showing normal LV function, no significant valve disease, no major structural issues  She has been taking Qvar for asthma, periodic albuterol before exercising She's not been taking Adderall for the past several weeks  In 12/2012,  occasional palpitations.   ECG at the time was normal and she was advised that palpitations were likely benign.  Drinking coconut water every morning for potassium  No EKG on today's visit  Other past medical history reviewed On  10/04/16, however, she was @ work and noted more frequent palpitations associated with mild chest tightness and lightheadedness.    She works @ [employer] and asked one of the ICU NPs to listen to her heart, @ which point it was noted that she was having ectopy.  B/c she felt poorly, she was eval in the ED where she was noted to have frequent, symptomatic PVCs.  Potassium was found to be low @ 3.3.  Mg was nl @ 2.1.    subsequently f/u with her PCP on 8/20 with follow up labs revealing a K of 4.0 and a nl TSH @ 1.69.    Echo  10/10/2016 Normal LV function 60-65% Mild TR    PMH:   has a past medical history of Asthma; Narcolepsy; and Symptomatic PVCs.  PSH:    Past Surgical History:  Procedure Laterality Date  . WISDOM TOOTH EXTRACTION      Current Outpatient Prescriptions  Medication Sig Dispense Refill  . albuterol (PROVENTIL HFA;VENTOLIN HFA) 108 (90 BASE) MCG/ACT inhaler Inhale into the lungs every 6 (six) hours as needed for wheezing or  shortness of breath.    . amphetamine-dextroamphetamine (ADDERALL XR) 5 MG 24 hr capsule Take 5 mg by mouth daily.    . beclomethasone (QVAR) 40 MCG/ACT inhaler Inhale 2 puffs into the lungs 2 (two) times daily. 1 Inhaler 2  . Butoconazole Nitrate, 1 Dose, (GYNAZOLE-1) 2 % CREA Place 1 Applicatorful vaginally as needed. 5 g 4  . Clobetasol Propionate 0.05 % shampoo APPLY TO AFFECTED AREA(S) AS NEEDED. 118 mL 3  . dexmethylphenidate (FOCALIN) 5 MG tablet Take 5 mg by mouth daily.    . Fluticasone-Salmeterol (ADVAIR DISKUS) 250-50 MCG/DOSE AEPB Inhale 1 puff into the lungs daily. 60 each 3  . Norethindrone Acetate-Ethinyl Estrad-FE (LOESTRIN 24 FE) 1-20 MG-MCG(24) tablet Take 1 tablet by mouth daily. 1 Package 11  . Sodium Oxybate (XYREM) 500 MG/ML SOLN Take by mouth 2 (two) times daily.    Marland Kitchen amphetamine-dextroamphetamine (ADDERALL XR) 5 MG 24 hr capsule Take 1 capsule by mouth daily.     No current facility-administered medications for this visit.      Allergies:   Sulfa antibiotics   Social History:  The patient  reports that she has never smoked. She has never used smokeless tobacco. She reports that she drinks alcohol. She reports that she does not use drugs.   Family History:   family history includes Heart disease in her maternal grandmother; Hyperlipidemia in her father;  Stroke in her maternal grandmother.    Review of Systems: Review of Systems  Constitutional: Negative.   Respiratory: Negative.   Cardiovascular: Negative.   Gastrointestinal: Negative.   Musculoskeletal: Negative.   Neurological: Negative.   Psychiatric/Behavioral: Negative.   All other systems reviewed and are negative.    PHYSICAL EXAM: VS:  BP 118/64 (BP Location: Left Arm, Patient Position: Sitting, Cuff Size: Normal)   Pulse 67   Ht  (1.676 m)   Wt 130 lb 4 oz (59.1 kg)   BMI 21.02 kg/m  , BMI Body mass index is 21.02 kg/m. GEN: Well nourished, well developed, in no acute distress  HEENT:  normal  Neck: no JVD, carotid bruits, or masses Cardiac: RRR; no murmurs, rubs, or gallops,no edema  Respiratory:  clear to auscultation bilaterally, normal work of breathing GI: soft, nontender, nondistended, + BS MS: no deformity or atrophy  Skin: warm and dry, no rash Neuro:  Strength and sensation are intact Psych: euthymic mood, full affect    Recent Labs: 10/04/2016: ALT 21; B Natriuretic Peptide 20.0; Hemoglobin 14.8; Magnesium 2.1; Platelets 186 10/08/2016: BUN 15; Creatinine, Ser 1.04; Potassium 4.0; Sodium 136; TSH 1.69    Lipid Panel Lab Results  Component Value Date   CHOL 138 01/05/2015   HDL 63 01/05/2015   LDLCALC 66 01/05/2015   TRIG 47 01/05/2015      Wt Readings from Last 3 Encounters:  11/19/16 130 lb 4 oz (59.1 kg)  10/31/16 132 lb 6.4 oz (60.1 kg)  10/17/16 131 lb 9.6 oz (59.7 kg)       ASSESSMENT AND PLAN:  Palpitations Periodic PVCs, 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 For severe symptoms could try antiarrhythmics  Hypokalemia Potassium up to 4, she supplements her potassium daily  Mild intermittent asthma without complication On albuterol before exercise, Qvar    Total encounter time more than 25 minutes  Greater than 50% was spent in counseling and coordination of care with the patient  Disposition:   F/U  As needed  No orders of the defined types were placed in this encounter.    Signed, Dossie Arbour, M.D., Ph.D. 11/19/2016  [facility] [place], [place] [phone]

## 2016-11-19 ENCOUNTER — Encounter: Payer: Self-pay | Admitting: Cardiovascular Disease

## 2016-11-19 ENCOUNTER — Ambulatory Visit (INDEPENDENT_AMBULATORY_CARE_PROVIDER_SITE_OTHER): Payer: 59 | Admitting: Cardiovascular Disease

## 2016-11-19 VITALS — BP 118/64 | HR 67 | Ht 66.0 in | Wt 130.2 lb

## 2016-11-19 DIAGNOSIS — R002 Palpitations: Secondary | ICD-10-CM | POA: Diagnosis not present

## 2016-11-19 DIAGNOSIS — J4520 Mild intermittent asthma, uncomplicated: Secondary | ICD-10-CM

## 2016-11-19 DIAGNOSIS — E876 Hypokalemia: Secondary | ICD-10-CM

## 2016-11-19 NOTE — Patient Instructions (Signed)
Medication Instructions:   No medication changes made  Labwork:  No new labs needed  Testing/Procedures:  No further testing at this time   Follow-Up: It was a pleasure seeing you in the office today. Please call us if you have new issues that need to be addressed before your next appt.  [phone]  Your physician wants you to follow-up in:  As needed   If you need a refill on your cardiac medications before your next appointment, please call your pharmacy.    Research CT coronary calcium score $150

## 2016-12-10 ENCOUNTER — Ambulatory Visit: Payer: Self-pay | Admitting: Cardiovascular Disease

## 2016-12-21 ENCOUNTER — Encounter: Payer: Self-pay | Admitting: Family Medicine

## 2016-12-21 ENCOUNTER — Ambulatory Visit (INDEPENDENT_AMBULATORY_CARE_PROVIDER_SITE_OTHER): Payer: 59 | Admitting: Family Medicine

## 2016-12-21 VITALS — BP 110/82 | HR 63 | Temp 98.2°F | Wt 133.8 lb

## 2016-12-21 DIAGNOSIS — R002 Palpitations: Secondary | ICD-10-CM | POA: Diagnosis not present

## 2016-12-21 DIAGNOSIS — G47419 Narcolepsy without cataplexy: Secondary | ICD-10-CM | POA: Diagnosis not present

## 2016-12-21 DIAGNOSIS — F418 Other specified anxiety disorders: Secondary | ICD-10-CM | POA: Diagnosis not present

## 2016-12-21 DIAGNOSIS — Z13220 Encounter for screening for lipoid disorders: Secondary | ICD-10-CM

## 2016-12-21 DIAGNOSIS — Z23 Encounter for immunization: Secondary | ICD-10-CM

## 2016-12-21 DIAGNOSIS — J4520 Mild intermittent asthma, uncomplicated: Secondary | ICD-10-CM

## 2016-12-21 MED ORDER — ALPRAZOLAM 0.25 MG PO TABS: 0.2500 mg | ORAL_TABLET | Freq: Two times a day (BID) | ORAL | 0 refills | Status: DC | PRN

## 2016-12-21 NOTE — Progress Notes (Addendum)
  Tanya AlarEric Shaylon Aden, MD Phone: [phone]  Tanya Glenn is a 31 y.o. female who presents today for follow-up.  Palpitations: Evaluated by cardiology. Found to have PVCs. Occasionally feels an uncomfortableness with this. Much less frequent. No shortness of breath.  Narcolepsy: Following with neurology. Currently taking Adderall intermittently. Also on xyrem. Has been falling asleep more frequently. Not falling asleep while driving.  Asthma: She's not using the Qvar daily. It does help when she does use it. Uses the albuterol prior to exercise. No nighttime symptoms.  Patient does note quite a bit of anxiety regarding flying on a plane. She's had even more anxiety recently thinking on a plane ride she supposed to take in December given that she has PVCs and she is getting anxious about having PVCs off-line. In the past she has required xanax to fly.   PMH: nonsmoker.   ROS see history of present illness  Objective  Physical Exam Vitals:   12/21/16 1526  BP: 110/82  Pulse: 63  Temp: 98.2 F (36.8 C)  SpO2: 99%    BP Readings from Last 3 Encounters:  12/21/16 110/82  11/19/16 118/64  10/31/16 128/74   Wt Readings from Last 3 Encounters:  12/21/16 133 lb 12.8 oz (60.7 kg)  11/19/16 130 lb 4 oz (59.1 kg)  10/31/16 132 lb 6.4 oz (60.1 kg)    Physical Exam  Constitutional: No distress.  Cardiovascular: Normal rate, regular rhythm and normal heart sounds.   Pulmonary/Chest: Effort normal and breath sounds normal.  Neurological: She is alert. Gait normal.  Skin: She is not diaphoretic.     Assessment/Plan: Please see individual problem list.  Asthma, mild intermittent Fairly well controlled. Now on Qvar. Encouraged her to take this daily.  Palpitations Improved. Continue to monitor. Could consider a beta blocker in the future.  Situational anxiety Related to flying. She has taken low-dose Xanax previously with this. Patient is a Teacher, early years/prepharmacist and knows that there is  an interaction with excessive drowsiness and respiratory depression related to her xyrem and Xanax. She notes since she will be traveling and she will be drinking some alcohol she will not be taking the xyrem while on her trip. She will not take the Xanax when she takes Xyrem. She notes she will not drive after taking the Xanax. She notes she will not take the Xanax and drink. We'll provide with a low dose of Xanax. Advised this could make her drowsy. She verbalized and understood the risk of this medication.  Narcolepsy Followed by neurology. Intermittently takes Adderall. Slight worsening of sleepiness. She'll continue to monitor.  Patient requests lipid screening.  Orders Placed This Encounter  Procedures  . Tdap vaccine greater than or equal to 7yo IM  . Lipid Profile    Meds ordered this encounter  Medications  . ALPRAZolam (XANAX) 0.25 MG tablet    Sig: Take 1 tablet (0.25 mg total) by mouth 2 (two) times daily as needed for anxiety.    Dispense:  4 tablet    Refill:  0    Tanya AlarEric Daneisha Surges, MD [facility] [place]

## 2016-12-21 NOTE — Patient Instructions (Signed)
Nice to see you. Please monitor your PVCs. If they worsen please let us know. Please try to take your Qvar daily. Please be wary with the Xanax while you are flying as it may make you excessively drowsy.

## 2016-12-21 NOTE — Assessment & Plan Note (Signed)
Followed by neurology. Intermittently takes Adderall. Slight worsening of sleepiness. She'll continue to monitor.

## 2016-12-21 NOTE — Assessment & Plan Note (Addendum)
Related to flying. She has taken low-dose Xanax previously with this. 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 She notes since she will be traveling and she will be drinking some alcohol she will not be taking the xyrem while on her trip. She will not take the Xanax when she takes Xyrem. She notes she will not drive after taking the Xanax. She notes she will not take the Xanax and drink. We'll provide with a low dose of Xanax. Advised this could make her drowsy. She verbalized and understood the risk of this medication.

## 2016-12-21 NOTE — Assessment & Plan Note (Signed)
Fairly well controlled. Now on Qvar. Encouraged her to take this daily.

## 2016-12-21 NOTE — Assessment & Plan Note (Signed)
Improved. Continue to monitor. Could consider a beta blocker in the future.

## 2016-12-22 LAB — LIPID PANEL
CHOL/HDL RATIO: 1.9 (calc) (ref <5.0)
CHOLESTEROL: 160 mg/dL (ref <200)
HDL: 85 mg/dL (ref >50)
LDL CHOLESTEROL (CALC): 62 mg/dL
NON-HDL CHOLESTEROL (CALC): 75 mg/dL (ref <130)
TRIGLYCERIDES: 51 mg/dL (ref <150)

## 2017-05-07 ENCOUNTER — Other Ambulatory Visit: Payer: Self-pay

## 2017-05-07 ENCOUNTER — Encounter: Payer: Self-pay | Admitting: Emergency Medicine

## 2017-05-07 ENCOUNTER — Emergency Department
Admission: EM | Admit: 2017-05-07 | Discharge: 2017-05-07 | Disposition: A | Discharge status: Home / Self Care | Payer: 59 | Attending: Emergency Medicine | Admitting: Emergency Medicine

## 2017-05-07 DIAGNOSIS — R42 Dizziness and giddiness: Secondary | ICD-10-CM | POA: Insufficient documentation

## 2017-05-07 DIAGNOSIS — Z79899 Other long term (current) drug therapy: Secondary | ICD-10-CM | POA: Insufficient documentation

## 2017-05-07 DIAGNOSIS — R5383 Other fatigue: Secondary | ICD-10-CM | POA: Diagnosis not present

## 2017-05-07 DIAGNOSIS — J4520 Mild intermittent asthma, uncomplicated: Secondary | ICD-10-CM | POA: Insufficient documentation

## 2017-05-07 DIAGNOSIS — R55 Syncope and collapse: Secondary | ICD-10-CM | POA: Diagnosis not present

## 2017-05-07 LAB — URINALYSIS, COMPLETE (UACMP) WITH MICROSCOPIC
BILIRUBIN URINE: NEGATIVE
GLUCOSE, UA: NEGATIVE mg/dL
Hgb urine dipstick: NEGATIVE
Ketones, ur: NEGATIVE mg/dL
Leukocytes, UA: NEGATIVE
NITRITE: NEGATIVE
PH: 8 (ref 5.0–8.0)
Protein, ur: NEGATIVE mg/dL
SPECIFIC GRAVITY, URINE: 1.012 (ref 1.005–1.030)

## 2017-05-07 LAB — HCG, QUANTITATIVE, PREGNANCY: hCG, Beta Chain, Quant, S: <1 m[IU]/mL (ref <5)

## 2017-05-07 LAB — CBC
HCT: 40.5 % (ref 35.0–47.0)
Hemoglobin: 13.8 g/dL (ref 12.0–16.0)
MCH: 31.2 pg (ref 26.0–34.0)
MCHC: 34 g/dL (ref 32.0–36.0)
MCV: 91.7 fL (ref 80.0–100.0)
Platelets: 193 10*3/uL (ref 150–440)
RBC: 4.42 MIL/uL (ref 3.80–5.20)
RDW: 13.1 % (ref 11.5–14.5)
WBC: 6.8 10*3/uL (ref 3.6–11.0)

## 2017-05-07 LAB — BASIC METABOLIC PANEL
ANION GAP: 9 (ref 5–15)
BUN: 15 mg/dL (ref 6–20)
CALCIUM: 8.9 mg/dL (ref 8.9–10.3)
CO2: 24 mmol/L (ref 22–32)
Chloride: 105 mmol/L (ref 101–111)
Creatinine, Ser: 1.14 mg/dL — ABNORMAL HIGH (ref 0.44–1.00)
GFR calc Af Amer: >60 mL/min (ref >60)
GLUCOSE: 122 mg/dL — AB (ref 65–99)
Potassium: 3.2 mmol/L — ABNORMAL LOW (ref 3.5–5.1)
SODIUM: 138 mmol/L (ref 135–145)

## 2017-05-07 LAB — POCT PREGNANCY, URINE: Preg Test, Ur: NEGATIVE

## 2017-05-07 MED ORDER — MECLIZINE HCL 25 MG PO TABS
12.5000 mg | ORAL_TABLET | Freq: Once | ORAL | Status: AC
  Administered 2017-05-07: 12.5 mg via ORAL
  Filled 2017-05-07: qty 1

## 2017-05-07 MED ORDER — ONDANSETRON 4 MG PO TBDP
8.0000 mg | ORAL_TABLET | Freq: Once | ORAL | Status: AC
Start: 2017-05-07 — End: 2017-05-07
  Administered 2017-05-07: 8 mg via ORAL
  Filled 2017-05-07: qty 2

## 2017-05-07 MED ORDER — ACETAMINOPHEN 325 MG PO TABS
ORAL_TABLET | ORAL | Status: AC
  Administered 2017-05-07: 650 mg via ORAL
  Filled 2017-05-07: qty 2

## 2017-05-07 MED ORDER — MECLIZINE HCL 25 MG PO TABS: 25.0000 mg | ORAL_TABLET | Freq: Three times a day (TID) | ORAL | 0 refills | Status: DC | PRN

## 2017-05-07 MED ORDER — ACETAMINOPHEN 325 MG PO TABS
650.0000 mg | ORAL_TABLET | ORAL | Status: AC
  Administered 2017-05-07: 650 mg via ORAL

## 2017-05-07 MED ORDER — POTASSIUM CHLORIDE CRYS ER 20 MEQ PO TBCR
40.0000 meq | EXTENDED_RELEASE_TABLET | Freq: Once | ORAL | Status: AC
  Administered 2017-05-07: 40 meq via ORAL
  Filled 2017-05-07: qty 2

## 2017-05-07 MED ORDER — SODIUM CHLORIDE 0.9 % IV BOLUS (SEPSIS)
1000.0000 mL | Freq: Once | INTRAVENOUS | Status: AC
  Administered 2017-05-07: 1000 mL via INTRAVENOUS

## 2017-05-07 NOTE — ED Provider Notes (Addendum)
[facility] Emergency Department Provider Note  ____________________________________________   I have reviewed the triage vital signs and the nursing notes. Where available I have reviewed prior notes and, if possible and indicated, outside hospital notes.    HISTORY  Chief Complaint Loss of Consciousness    HPI Tanya Glenn is a 32 y.o. female   who has a history of narcolepsy and asthma, she states that she was in her normal state of health this morning did have her normal breakfast, did not take all of her medications but sometimes does miss them, and went to work.  On the way into the department, she does work in the emergency room, she became very dizzy.  She describes true vertigo.  She denies any numbness or weakness.  She did not actually pass out but she felt quite lightheaded and she sat down.  She states that she was nauseated.  The true vertigo seem to subside until she moved her head again.  Now that she is been in the department she is feeling less nauseated and less vertiginous.  She denies headache, she denies head trauma, she has no focal neurologic deficit no difficulty speaking, she states that she is never had this before.  She does have "ear trouble" in the past but never had vertigo.  She is not having any ear pain or fever.  She denies recent URI.  She states she has no headache, no stiff neck and no fever.  She denies chest pain shortness of breath or palpitations.       Past Medical History:  Diagnosis Date  . Asthma   . Narcolepsy   . Symptomatic PVCs     Patient Active Problem List   Diagnosis Date Noted  . Narcolepsy 12/21/2016  . Palpitations 10/08/2016  . Hypokalemia 10/08/2016  . Asthma, mild intermittent 02/16/2016  . Leg pain, right 09/26/2015  . Situational anxiety 02/05/2012  . 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 WISDOM TOOTH EXTRACTION      Prior to Admission  medications   Medication Sig Start Date End Date Taking? Authorizing Provider  amphetamine-dextroamphetamine (ADDERALL XR) 5 MG 24 hr capsule Take 5 mg by mouth daily.   Yes [provider]  Norethindrone Acetate-Ethinyl Estrad-FE (LOESTRIN 24 FE) 1-20 MG-MCG(24) tablet Take 1 tablet by mouth daily. 11/14/16  Yes Shambley, Melody N, CNM  Sodium Oxybate (XYREM) 500 MG/ML SOLN Take by mouth 2 (two) times daily.   Yes [provider]  albuterol (PROVENTIL HFA;VENTOLIN HFA) 108 (90 BASE) MCG/ACT inhaler Inhale into the lungs every 6 (six) hours as needed for wheezing or shortness of breath.    [provider]  ALPRAZolam Prudy Feeler) 0.25 MG tablet Take 1 tablet (0.25 mg total) by mouth 2 (two) times daily as needed for anxiety. Patient not taking: Reported on 05/07/2017 12/21/16   Glori Luis, MD  beclomethasone (QVAR) 40 MCG/ACT inhaler Inhale 2 puffs into the lungs 2 (two) times daily. 10/17/16   Shambley, Melody N, CNM  Butoconazole Nitrate, 1 Dose, (GYNAZOLE-1) 2 % CREA Place 1 Applicatorful vaginally as needed. 11/14/16   Shambley, Melody N, CNM  Clobetasol Propionate 0.05 % shampoo APPLY TO AFFECTED AREA(S) AS NEEDED. 08/06/16   Shambley, Melody N, CNM  dexmethylphenidate (FOCALIN) 5 MG tablet Take 5 mg by mouth daily.    [provider]    Allergies Sulfa antibiotics  Family History  Problem Relation Age of Onset  .  Heart disease Maternal Grandmother   . Stroke Maternal Grandmother   . Hyperlipidemia Father     Social History Social History   Tobacco Use  . Smoking status: Never Smoker  . Smokeless tobacco: Never Used  Substance Use Topics  . Alcohol use: Yes    Comment: occasional drink on the weekend.  . Drug use: No    Review of Systems Constitutional: No fever/chills Eyes: No visual changes. ENT: No sore throat. No stiff neck no neck pain Cardiovascular: Denies chest pain. Respiratory: Denies shortness of breath. Gastrointestinal:   no  vomiting.  No diarrhea.  No constipation. Genitourinary: Negative for dysuria. Musculoskeletal: Negative lower extremity swelling Skin: Negative for rash. Neurological: Negative for severe headaches, focal weakness or numbness.   ____________________________________________   PHYSICAL EXAM:  VITAL SIGNS: ED Triage Vitals  Enc Vitals Group     BP 05/07/17 0724 134/89     Pulse Rate 05/07/17 0724 88     Resp 05/07/17 0724 18     Temp 05/07/17 0724 (!) 97.3 F (36.3 C)     Temp Source 05/07/17 0724 Axillary     SpO2 05/07/17 0724 100 %     Weight 05/07/17 0720 134 lb (60.8 kg)     Height 05/07/17 0720 5\' 6"  (1.676 m)     Head Circumference --      Peak Flow --      Pain Score 05/07/17 0720 3     Pain Loc --      Pain Edu? --      Excl. in GC? --     Constitutional: Alert and oriented. Well appearing and in no acute distress. Eyes: Conjunctivae are normal Head: Atraumatic HEENT: No congestion/rhinnorhea. Mucous membranes are moist.  Oropharynx non-erythematous Neck:   Nontender with no meningismus, no masses, no stridor Cardiovascular: Normal rate, regular rhythm. Grossly normal heart sounds.  Good peripheral circulation. Respiratory: Normal respiratory effort.  No retractions. Lungs CTAB. Abdominal: Soft and nontender. No distention. No guarding no rebound Back:  There is no focal tenderness or step off.  there is no midline tenderness there are no lesions noted. there is no CVA tenderness Musculoskeletal: No lower extremity tenderness, no upper extremity tenderness. No joint effusions, no DVT signs strong distal pulses no edema Neurologic: Cranial nerves II through XII are grossly intact 5 out of 5 strength bilateral upper and lower extremity. Finger to nose within normal limits heel to shin within normal limits, speech is normal with no word finding difficulty or dysarthria, reflexes symmetric, pupils are equally round and reactive to light, there is no pronator drift,  sensation is normal, vision is intact to confrontation, gait is deferred, there is few beats of easily fatigable nystagmus, normal neurologic exam Skin:  Skin is warm, dry and intact. No rash noted. Psychiatric: Mood and affect are normal. Speech and behavior are normal.  ____________________________________________   LABS (all labs ordered are listed, but only abnormal results are displayed)  Labs Reviewed  BASIC METABOLIC PANEL - Abnormal; Notable for the following components:      Result Value   Potassium 3.2 (*)    Glucose, Bld 122 (*)    Creatinine, Ser 1.14 (*)    All other components within normal limits  URINALYSIS, COMPLETE (UACMP) WITH MICROSCOPIC - Abnormal; Notable for the following components:   Color, Urine YELLOW (*)    APPearance CLEAR (*)    Bacteria, UA RARE (*)    Squamous Epithelial / LPF 0-5 (*)  All other components within normal limits  CBC  HCG, QUANTITATIVE, PREGNANCY  POC URINE PREG, ED  POCT PREGNANCY, URINE  CBG MONITORING, ED    Pertinent labs  results that were available during my care of the patient were reviewed by me and considered in my medical decision making (see chart for details). ____________________________________________  EKG  I personally interpreted any EKGs ordered by me or triage EKG shows sinus rhythm, no acute ST elevation or depression normal axis no long QT, no ischemic changes ____________________________________________  RADIOLOGY  Pertinent labs & imaging results that were available during my care of the patient were reviewed by me and considered in my medical decision making (see chart for details). If possible, patient and/or family made aware of any abnormal findings.  No results found. ____________________________________________    PROCEDURES  Procedure(s) performed: None  Procedures  Critical Care performed: None  ____________________________________________   INITIAL IMPRESSION / ASSESSMENT AND PLAN  / ED COURSE  Pertinent labs & imaging results that were available during my care of the patient were reviewed by me and considered in my medical decision making (see chart for details).  Patient here with true vertigo, mostly resolved by the time I saw her she did have a few beats of nystagmus positive Hallpike.  Patient most likely has peripheral vertigo.  She had a very rapid bout of it feels much better now.  She is able to ambulate to the bathroom after nausea medication IV fluids and Antivert.  Patient is neurologically intact.  Differential includes cardiogenic near syncope but given true vertigo I have low suspicion for that, she had a "spinning" sensation in addition, EKG and blood work are reassuring nothing to suggest anemia nothing to suggest infection nothing to suggest pregnancy nothing to suggest therefore ectopic,  nothing to suggest pneumonia nothing to suggest CVA meningitis or head bleed, she is in no acute distress she remains neurologically intact.  Patient has no family history of early cardiac death she states.  TMs are normal bilaterally with no foreign body noted on the ears.  There is no evidence of infection patient does follow with a neurologist for her narcolepsy, I was going to discharge her on Antivert but she wants to make sure that is okay with her neurologist.  We have paged her [facility] neurologist.  I do not think imaging is acutely indicated in this young person with true vertigo symptoms, additionally, we have kept her on the monitor while she is here there is been no ectopy.  ----------------------------------------- 11:57 AM on 05/07/2017 -----------------------------------------  Discussed with Dr. Iran Planas, of [facility] neurology, who knows the patient's history there and is covering for her Dr. Eula Listen.  He agrees with discharge and management, he does not feel that Antivert will be contraindicated with her medications, he agrees with close outpatient follow-up with them,  he does not feel the emergency department imaging is indicated today nor do I.  Patient has no ongoing symptoms.  There is no evidence of cardiogenic near syncope, given true vertigo and there is no evidence of CVA given normal neurologic exam and transitory fatigable symptoms.  Return precautions and follow-up given and understood her potassium slightly low we are repleting that.     ____________________________________________   FINAL CLINICAL IMPRESSION(S) / ED DIAGNOSES  Final diagnoses:  None      This chart was dictated using voice recognition software.  Despite best efforts to proofread,  errors can occur which can change meaning.  Jeanmarie Plant, MD 05/07/17 1145    Jeanmarie Plant, MD 05/07/17 1158

## 2017-05-07 NOTE — ED Notes (Signed)
When  Asked pt if she could urinate at this time, pt stated "I would like to speak with a doctor before I pee in a cup." Specimen cup given to pt.

## 2017-05-07 NOTE — ED Notes (Signed)
Pain medication ineffective. Patient reports improvement with switching different positions. Allowed to rest at this time.

## 2017-05-07 NOTE — ED Notes (Signed)
Warm blanket given to pt

## 2017-05-07 NOTE — ED Notes (Signed)
Pt placed in SWA in a recliner.

## 2017-05-07 NOTE — ED Triage Notes (Signed)
Says she was walking into work today.  Felt weak and then lowered her selt to ground.  Did not pass out completely.

## 2017-06-21 ENCOUNTER — Ambulatory Visit: Payer: 59 | Admitting: Obstetrics and Gynecology

## 2017-06-21 ENCOUNTER — Encounter: Payer: Self-pay | Admitting: Obstetrics and Gynecology

## 2017-06-21 VITALS — BP 137/91 | HR 65 | Ht 66.5 in | Wt 132.8 lb

## 2017-06-21 DIAGNOSIS — L237 Allergic contact dermatitis due to plants, except food: Secondary | ICD-10-CM | POA: Diagnosis not present

## 2017-06-21 DIAGNOSIS — Z113 Encounter for screening for infections with a predominantly sexual mode of transmission: Secondary | ICD-10-CM

## 2017-06-21 MED ORDER — PREDNISONE 50 MG PO TABS: ORAL_TABLET | ORAL | 1 refills | Status: DC

## 2017-06-21 NOTE — Progress Notes (Signed)
  Subjective:     Patient ID: Tanya Glenn, female   DOB: September 25, 1985, 32 y.o.   MRN: [id number]  HPI Desires STD screening. Denies symptoms. Has new female partner and desires testing before they become sexually active. No menses with current pills.  Has poison oak on arm from working in yard this weekend. request prednisone prescription.  Review of Systems Occasionally yeast symptoms as in past.     Objective:   Physical Exam A&Ox4 Well groomed female in no distress Mild blisters on left inner antecubital c/w poison oak Pelvic exam: normal external genitalia, vulva, vagina, cervix, uterus and adnexa.    Assessment:     STD screening  Poison oak     Plan:     Labs obtained for STD screening Prednisone  bid as needed given. Also instructed to add Rhus Tox for poison oak prevention and treatment.  RTC as needed.  Bess Saltzman Shmabley,CNM

## 2017-06-22 LAB — HEPATITIS PANEL, ACUTE
HEP A IGM: NEGATIVE
HEP B C IGM: NEGATIVE
HEP B S AG: NEGATIVE

## 2017-06-22 LAB — HSV(HERPES SIMPLEX VRS) I + II AB-IGG
HSV 1 Glycoprotein G Ab, IgG: <0.91 index (ref 0.00–0.90)
HSV 2 IgG, Type Spec: <0.91 index (ref 0.00–0.90)

## 2017-06-22 LAB — RPR: RPR: NONREACTIVE

## 2017-06-22 LAB — HIV ANTIBODY (ROUTINE TESTING W REFLEX): HIV Screen 4th Generation wRfx: NONREACTIVE

## 2017-06-26 ENCOUNTER — Encounter: Payer: Self-pay | Admitting: Obstetrics and Gynecology

## 2017-06-26 ENCOUNTER — Ambulatory Visit (INDEPENDENT_AMBULATORY_CARE_PROVIDER_SITE_OTHER): Payer: 59 | Admitting: Family Medicine

## 2017-06-26 ENCOUNTER — Other Ambulatory Visit: Payer: Self-pay

## 2017-06-26 ENCOUNTER — Encounter: Payer: Self-pay | Admitting: Family Medicine

## 2017-06-26 DIAGNOSIS — G47419 Narcolepsy without cataplexy: Secondary | ICD-10-CM | POA: Diagnosis not present

## 2017-06-26 DIAGNOSIS — J4520 Mild intermittent asthma, uncomplicated: Secondary | ICD-10-CM

## 2017-06-26 DIAGNOSIS — H8110 Benign paroxysmal vertigo, unspecified ear: Secondary | ICD-10-CM

## 2017-06-26 DIAGNOSIS — K219 Gastro-esophageal reflux disease without esophagitis: Secondary | ICD-10-CM

## 2017-06-26 LAB — CT NG TV HSV BY NAA
Chlamydia by NAA: NEGATIVE
Gonococcus by NAA: NEGATIVE
HSV 1 NAA: NEGATIVE
HSV 2 NAA: NEGATIVE
TRICH VAG BY NAA: NEGATIVE

## 2017-06-26 NOTE — Patient Instructions (Signed)
Nice to see you. Please try taking the famotidine as we discussed. Please monitor for recurrence of your vertigo.  If it comes on and does not go away please be reevaluated.  If it does go away you can try the maneuvers to help with it.

## 2017-06-26 NOTE — Progress Notes (Signed)
  Tanya Alar, MD Phone: [phone]  Tanya Glenn is a 32 y.o. female who presents today for f/u.  Asthma: No longer on Qvar.  She has not needed her albuterol in a number of months.  No wheezing, cough, or dyspnea.  Her narcolepsy is stable on Xyrem.  She continues to follow with neurology.  She was seen in the ED for vertigo that was deemed peripheral and sounds as though was BPPV.  She was walking and her head was turned in one direction and she developed sudden onset vertiginous symptoms.  No ringing in her ears.  It occurred 2 more times at night after ED evaluation.  Has not recurred since then.  She did the exercises to get it to go away.  GERD:   Reflux symptoms: yes, burning, phlegm, cough   Abd pain: no   Blood in stool: no  Dysphagia: no   EGD: no  Medication: famotidine    Social History   Tobacco Use  Smoking Status Never Smoker  Smokeless Tobacco Never Used     ROS see history of present illness  Objective  Physical Exam Vitals:   06/26/17 1534  BP: 120/82  Pulse: 78  Temp: 98.8 F (37.1 C)  SpO2: 99%    BP Readings from Last 3 Encounters:  06/26/17 120/82  06/21/17 (!) 137/91  05/07/17 119/90   Wt Readings from Last 3 Encounters:  06/26/17 132 lb (59.9 kg)  06/21/17 132 lb 12.8 oz (60.2 kg)  05/07/17 134 lb (60.8 kg)    Physical Exam  Constitutional: No distress.  Cardiovascular: Normal rate, regular rhythm and normal heart sounds.  Pulmonary/Chest: Effort normal and breath sounds normal.  Abdominal: Soft. Bowel sounds are normal. She exhibits no distension. There is no tenderness.  Neurological: She is alert.  Skin: Skin is warm and dry. She is not diaphoretic.     Assessment/Plan: Please see individual problem list.  Asthma, mild intermittent Asymptomatic recently.  She will monitor for recurrent issues.  Albuterol as needed.  Narcolepsy Stable on current regimen.  She will continue to see neurology.  BPPV (benign  paroxysmal positional vertigo) Description and ED evaluation seems consistent with this.  This has not recurred.  Discussed if she has persistent vertigo being evaluated.  If she has intermittent vertigo she can try the exercises to get it to go away.  GERD (gastroesophageal reflux disease) Symptoms consistent with GERD.  Discussed consistent use of famotidine daily for 1 month 30 to 60 minutes prior to lunch given that her symptoms start with lunch and occur with dinner.  Discussed she could trial a PPI for 14 days over-the-counter as well.  Reevaluate in 6 months.  If not improving she will let us know.   No orders of the defined types were placed in this encounter.   No orders of the defined types were placed in this encounter.    Tanya Alar, MD [facility]

## 2017-06-26 NOTE — Assessment & Plan Note (Signed)
Stable on current regimen.  She will continue to see neurology.

## 2017-06-26 NOTE — Assessment & Plan Note (Signed)
Symptoms consistent with GERD.  Discussed consistent use of famotidine daily for 1 month 30 to 60 minutes prior to lunch given that her symptoms start with lunch and occur with dinner.  Discussed she could trial a PPI for 14 days over-the-counter as well.  Reevaluate in 6 months.  If not improving she will let us know.

## 2017-06-26 NOTE — Assessment & Plan Note (Signed)
Description and ED evaluation seems consistent with this.  This has not recurred.  Discussed if she has persistent vertigo being evaluated.  If she has intermittent vertigo she can try the exercises to get it to go away.

## 2017-06-26 NOTE — Assessment & Plan Note (Signed)
Asymptomatic recently.  She will monitor for recurrent issues.  Albuterol as needed.

## 2017-07-02 DIAGNOSIS — G47419 Narcolepsy without cataplexy: Secondary | ICD-10-CM | POA: Diagnosis not present

## 2017-10-02 ENCOUNTER — Encounter: Payer: Self-pay | Admitting: Obstetrics and Gynecology

## 2017-10-02 ENCOUNTER — Ambulatory Visit (INDEPENDENT_AMBULATORY_CARE_PROVIDER_SITE_OTHER): Payer: 59 | Admitting: Obstetrics and Gynecology

## 2017-10-02 ENCOUNTER — Other Ambulatory Visit: Payer: Self-pay | Admitting: Obstetrics and Gynecology

## 2017-10-02 VITALS — BP 132/90 | HR 67 | Ht 66.0 in | Wt 132.0 lb

## 2017-10-02 DIAGNOSIS — Z01419 Encounter for gynecological examination (general) (routine) without abnormal findings: Secondary | ICD-10-CM

## 2017-10-02 NOTE — Patient Instructions (Signed)
Preventive Care 18-39 Years, Female Preventive care refers to lifestyle choices and visits with your health care provider that can promote health and wellness. What does preventive care include?  A yearly physical exam. This is also called an annual well check.  Dental exams once or twice a year.  Routine eye exams. Ask your health care provider how often you should have your eyes checked.  Personal lifestyle choices, including: ? Daily care of your teeth and gums. ? Regular physical activity. ? Eating a healthy diet. ? Avoiding tobacco and drug use. ? Limiting alcohol use. ? Practicing safe sex. ? Taking vitamin and mineral supplements as recommended by your health care provider. What happens during an annual well check? The services and screenings done by your health care provider during your annual well check will depend on your age, overall health, lifestyle risk factors, and family history of disease. Counseling Your health care provider may ask you questions about your:  Alcohol use.  Tobacco use.  Drug use.  Emotional well-being.  Home and relationship well-being.  Sexual activity.  Eating habits.  Work and work Statistician.  Method of birth control.  Menstrual cycle.  Pregnancy history.  Screening You may have the following tests or measurements:  Height, weight, and BMI.  Diabetes screening. This is done by checking your blood sugar (glucose) after you have not eaten for a while (fasting).  Blood pressure.  Lipid and cholesterol levels. These may be checked every 5 years starting at age 27.  Skin check.  Hepatitis C blood test.  Hepatitis B blood test.  Sexually transmitted disease (STD) testing.  BRCA-related cancer screening. This may be done if you have a family history of breast, ovarian, tubal, or peritoneal cancers.  Pelvic exam and Pap test. This may be done every 3 years starting at age 63. Starting at age 69, this may be done  every 5 years if you have a Pap test in combination with an HPV test.  Discuss your test results, treatment options, and if necessary, the need for more tests with your health care provider. Vaccines Your health care provider may recommend certain vaccines, such as:  Influenza vaccine. This is recommended every year.  Tetanus, diphtheria, and acellular pertussis (Tdap, Td) vaccine. You may need a Td booster every 10 years.  Varicella vaccine. You may need this if you have not been vaccinated.  HPV vaccine. If you are 78 or younger, you may need three doses over 6 months.  Measles, mumps, and rubella (MMR) vaccine. You may need at least one dose of MMR. You may also need a second dose.  Pneumococcal 13-valent conjugate (PCV13) vaccine. You may need this if you have certain conditions and were not previously vaccinated.  Pneumococcal polysaccharide (PPSV23) vaccine. You may need one or two doses if you smoke cigarettes or if you have certain conditions.  Meningococcal vaccine. One dose is recommended if you are age 68-21 years and a first-year college student living in a residence hall, or if you have one of several medical conditions. You may also need additional booster doses.  Hepatitis A vaccine. You may need this if you have certain conditions or if you travel or work in places where you may be exposed to hepatitis A.  Hepatitis B vaccine. You may need this if you have certain conditions or if you travel or work in places where you may be exposed to hepatitis B.  Haemophilus influenzae type b (Hib) vaccine. You may need this  if you have certain risk factors.  Talk to your health care provider about which screenings and vaccines you need and how often you need them. This information is not intended to replace advice given to you by your health care provider. Make sure you discuss any questions you have with your health care provider. Document Released: 04/03/2001 Document Revised:  10/26/2015 Document Reviewed: 12/07/2014 Elsevier Interactive Patient Education  2018 Elsevier Inc.  

## 2017-10-02 NOTE — Progress Notes (Signed)
Subjective:   Tanya NakaiSheema Glenn is a 32 y.o. G0P0000 Caucasian female here for a routine well-woman exam.  No LMP recorded. (Menstrual status: Oral contraceptives).    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  Past Medical History Past Medical History:  Diagnosis Date  . Asthma   . Narcolepsy   . Symptomatic PVCs     Past Surgical History Past Surgical History:  Procedure Laterality Date  . WISDOM TOOTH EXTRACTION      Gynecologic History G0P0000  No LMP recorded. (Menstrual status: Oral contraceptives). Contraception: OCP (estrogen/progesterone) Last Pap: 2018. Results were: normal   Obstetric History OB History  Gravida Para Term Preterm AB Living  0 0 0 0 0 0  SAB TAB Ectopic Multiple Live Births  0 0 0 0      Current Medications Current Outpatient Medications on File Prior to Visit  Medication Sig Dispense Refill  . albuterol (PROVENTIL HFA;VENTOLIN HFA) 108 (90 BASE) MCG/ACT inhaler Inhale into the lungs every 6 (six) hours as needed for wheezing or shortness of breath.    . amphetamine-dextroamphetamine (ADDERALL XR) 5 MG 24 hr capsule Take 5 mg by mouth daily.    . beclomethasone (QVAR) 40 MCG/ACT inhaler Inhale 2 puffs into the lungs 2 (two) times daily. 1 Inhaler 2  . Clobetasol Propionate 0.05 % shampoo APPLY TO AFFECTED AREA(S) AS NEEDED. 118 mL 3  . dexmethylphenidate (FOCALIN) 5 MG tablet Take 5 mg by mouth daily.    . meclizine (ANTIVERT) 25 MG tablet Take 1 tablet (25 mg total) by mouth 3 (three) times daily as  needed. 15 tablet 0  . Norethindrone Acetate-Ethinyl Estrad-FE (LOESTRIN 24 FE) 1-20 MG-MCG(24) tablet Take 1 tablet by mouth daily. 1 Package 11  . predniSONE (DELTASONE) 50 MG tablet 1 tablet 2 times a day as needed for poison oak. 60 tablet 1  . Sodium Oxybate (XYREM) 500 MG/ML SOLN Take by mouth 2 (two) times daily.    Marland Kitchen. ALPRAZolam (XANAX) 0.25 MG tablet Take 1 tablet (0.25 mg total) by mouth 2 (two) times daily as needed for anxiety. 4 tablet 0  . Butoconazole Nitrate, 1 Dose, (GYNAZOLE-1) 2 % CREA Place 1 Applicatorful vaginally as needed. (Patient not taking: Reported on 10/02/2017) 5 g 4   No current facility-administered medications on file prior to visit.     Review of Systems Patient denies any headaches, blurred vision, shortness of breath, chest pain, abdominal pain, problems with bowel movements, urination, or intercourse.  Objective:  BP 132/90   Pulse 67   Ht 5\' 6"  (1.676 m)   Wt 132 lb (59.9 kg)   BMI 21.31 kg/m  Physical Exam  General:  Well developed, well nourished, no acute distress. She is alert and oriented x3. Skin:  Warm and dry Neck:  Midline trachea, no thyromegaly or nodules Cardiovascular: Regular rate and rhythm, no murmur heard Lungs:  Effort normal, all lung fields clear to auscultation bilaterally Breasts:  No dominant palpable mass, retraction, or nipple discharge Abdomen:  Soft, non tender, no hepatosplenomegaly or masses Pelvic:  External genitalia is normal in appearance.  The vagina is  normal in appearance. The cervix is bulbous, no CMT.  Thin prep pap is done with HR HPV cotesting. Uterus is felt to be normal size, shape, and contour.  No adnexal masses or tenderness noted. Microscopic wet-mount exam shows negative for pathogens, normal epithelial cells, vaginal pH is 4.5. Extremities:  No swelling or varicosities noted Psych:  She has a normal mood and affect  Assessment:   Healthy well-woman exam  Plan:  Reassured of normal wetprep  findings. F/U 1 year for AE, or sooner if needed   Melody Suzan NailerN Shambley, CNM

## 2017-10-07 LAB — CYTOLOGY - PAP

## 2017-10-14 ENCOUNTER — Other Ambulatory Visit: Payer: Self-pay | Admitting: Obstetrics and Gynecology

## 2018-01-01 ENCOUNTER — Ambulatory Visit: Payer: Self-pay | Admitting: Family Medicine

## 2018-01-22 ENCOUNTER — Other Ambulatory Visit: Payer: Self-pay | Admitting: Obstetrics and Gynecology

## 2018-02-03 ENCOUNTER — Other Ambulatory Visit: Payer: Self-pay | Admitting: Obstetrics and Gynecology

## 2018-02-17 ENCOUNTER — Other Ambulatory Visit: Payer: Self-pay | Admitting: *Deleted

## 2018-02-17 MED ORDER — ALBUTEROL SULFATE HFA 108 (90 BASE) MCG/ACT IN AERS: 1.0000 | INHALATION_SPRAY | RESPIRATORY_TRACT | 6 refills | Status: DC | PRN

## 2018-04-29 ENCOUNTER — Encounter: Payer: Self-pay | Admitting: Family Medicine

## 2018-04-29 ENCOUNTER — Ambulatory Visit (INDEPENDENT_AMBULATORY_CARE_PROVIDER_SITE_OTHER): Payer: 59 | Admitting: Family Medicine

## 2018-04-29 VITALS — BP 98/70 | HR 76 | Temp 98.0°F | Ht 66.0 in | Wt 132.4 lb

## 2018-04-29 DIAGNOSIS — Z13220 Encounter for screening for lipoid disorders: Secondary | ICD-10-CM | POA: Insufficient documentation

## 2018-04-29 DIAGNOSIS — G47419 Narcolepsy without cataplexy: Secondary | ICD-10-CM | POA: Diagnosis not present

## 2018-04-29 DIAGNOSIS — J4520 Mild intermittent asthma, uncomplicated: Secondary | ICD-10-CM

## 2018-04-29 DIAGNOSIS — Z3169 Encounter for other general counseling and advice on procreation: Secondary | ICD-10-CM | POA: Insufficient documentation

## 2018-04-29 MED ORDER — BECLOMETHASONE DIPROP HFA 40 MCG/ACT IN AERB: INHALATION_SPRAY | RESPIRATORY_TRACT | 2 refills | Status: DC

## 2018-04-29 NOTE — Assessment & Plan Note (Signed)
Check lipid panel  

## 2018-04-29 NOTE — Patient Instructions (Signed)
Nice to see you. We will get you to see the perinatologist. I have refilled your Qvar. We will contact you with your lab results.

## 2018-04-29 NOTE — Assessment & Plan Note (Signed)
She will continue to see her neurologist.  If she does decide to proceed with trying to become pregnant she will involve her neurologist regarding her medication.

## 2018-04-29 NOTE — Assessment & Plan Note (Signed)
Seems to be mostly related to exercise.  She has had benefit from Qvar previously and we will refill this.  Encouraged her to use this.

## 2018-04-29 NOTE — Assessment & Plan Note (Signed)
Refer to the perinatology clinic at [facility].

## 2018-04-29 NOTE — Progress Notes (Signed)
  Tommi Rumps, MD Phone: [phone]  Tanya Glenn is a 33 y.o. female who presents today for follow-up.  Asthma: Patient has not been using her Qvar.  She has had to use her albuterol when exercising.  She has not had to use her albuterol otherwise.  In the past when she used her Qvar she had no significant albuterol use.  No cough, wheezing, or nighttime symptoms.  Narcolepsy: Notes she has done fairly well with this.  She tried coming off her medications and notes it was not as hard as she thought it would be.  She follows up with her neurologist in May.  She is currently taking her medications.  Lipid screening: Patient requests lipid screening.  She exercises 3 times a week and eats a very healthy diet with meal prepping.  Preconception counseling: Patient reports she would like to consider pregnant later this year and would like to see a perinatologist for preconception counseling given her narcolepsy and medications for this.  Social History   Tobacco Use  Smoking Status Never Smoker  Smokeless Tobacco Never Used     ROS see history of present illness  Objective  Physical Exam Vitals:   04/29/18 1526  BP: 98/70  Pulse: 76  Temp: 98 F (36.7 C)  SpO2: 99%    BP Readings from Last 3 Encounters:  04/29/18 98/70  10/02/17 132/90  06/26/17 120/82   Wt Readings from Last 3 Encounters:  04/29/18 132 lb 6.4 oz (60.1 kg)  10/02/17 132 lb (59.9 kg)  06/26/17 132 lb (59.9 kg)    Physical Exam Constitutional:      General: She is not in acute distress.    Appearance: She is not diaphoretic.  Cardiovascular:     Rate and Rhythm: Normal rate and regular rhythm.     Heart sounds: Normal heart sounds.  Pulmonary:     Effort: Pulmonary effort is normal.     Breath sounds: Normal breath sounds.  Skin:    General: Skin is warm and dry.  Neurological:     Mental Status: She is alert.      Assessment/Plan: Please see individual problem list.  Asthma, mild  intermittent Seems to be mostly related to exercise.  She has had benefit from Qvar previously and we will refill this.  Encouraged her to use this.  Narcolepsy She will continue to see her neurologist.  If she does decide to proceed with trying to become pregnant she will involve her neurologist regarding her medication.  Lipid screening Check lipid panel.  Pre-conception counseling Refer to the perinatology clinic at [facility].   Orders Placed This Encounter  Procedures  . Lipid panel  . Comp Met (CMET)  . Ambulatory referral to Obstetrics / Gynecology    Referral Priority:   Routine    Referral Type:   Consultation    Referral Reason:   Specialty Services Required    Requested Specialty:   Obstetrics and Gynecology    Number of Visits Requested:   1    Meds ordered this encounter  Medications  . beclomethasone (QVAR REDIHALER) 40 MCG/ACT inhaler    Sig: INHALE 2 PUFFS INTO THE LUNGS TWO TIMES DAILY    Dispense:  10.6 g    Refill:  2     Tommi Rumps, MD [place]

## 2018-04-30 LAB — COMPREHENSIVE METABOLIC PANEL
ALBUMIN: 4.3 g/dL (ref 3.5–5.2)
ALK PHOS: 40 U/L (ref 39–117)
ALT: 23 U/L (ref 0–35)
AST: 22 U/L (ref 0–37)
BUN: 19 mg/dL (ref 6–23)
CO2: 28 mEq/L (ref 19–32)
Calcium: 9.5 mg/dL (ref 8.4–10.5)
Chloride: 100 mEq/L (ref 96–112)
Creatinine, Ser: 1.03 mg/dL (ref 0.40–1.20)
GFR: 61.37 mL/min (ref >60.00)
Glucose, Bld: 105 mg/dL — ABNORMAL HIGH (ref 70–99)
POTASSIUM: 4 meq/L (ref 3.5–5.1)
Sodium: 136 mEq/L (ref 135–145)
TOTAL PROTEIN: 7.2 g/dL (ref 6.0–8.3)
Total Bilirubin: 0.4 mg/dL (ref 0.2–1.2)

## 2018-04-30 LAB — LIPID PANEL
CHOLESTEROL: 141 mg/dL (ref 0–200)
HDL: 72.5 mg/dL (ref >39.00)
LDL Cholesterol: 62 mg/dL (ref 0–99)
NonHDL: 68.45
Total CHOL/HDL Ratio: 2
Triglycerides: 31 mg/dL (ref 0.0–149.0)
VLDL: 6.2 mg/dL (ref 0.0–40.0)

## 2018-05-23 ENCOUNTER — Encounter: Payer: Self-pay | Admitting: Family Medicine

## 2018-05-23 ENCOUNTER — Other Ambulatory Visit: Payer: Self-pay

## 2018-05-23 ENCOUNTER — Telehealth: Payer: Self-pay

## 2018-05-23 ENCOUNTER — Ambulatory Visit (INDEPENDENT_AMBULATORY_CARE_PROVIDER_SITE_OTHER): Payer: 59 | Admitting: Family Medicine

## 2018-05-23 DIAGNOSIS — J4521 Mild intermittent asthma with (acute) exacerbation: Secondary | ICD-10-CM | POA: Diagnosis not present

## 2018-05-23 DIAGNOSIS — J45901 Unspecified asthma with (acute) exacerbation: Secondary | ICD-10-CM | POA: Insufficient documentation

## 2018-05-23 MED ORDER — PREDNISONE 20 MG PO TABS: 40.0000 mg | ORAL_TABLET | Freq: Every day | ORAL | 0 refills | Status: DC

## 2018-05-23 NOTE — Telephone Encounter (Signed)
Copied from CRM [id number]. Topic: Appointment Scheduling - Scheduling Inquiry for Clinic >> May 23, 2018  9:58 AM Wyonia Hough E wrote: Reason for CRM: Pt has been scheduled for 3:30pm today with Dr. Birdie Sons for worsening of asthma/ Pt is ok with webex or virtual appt / email has been verified/ please advise

## 2018-05-23 NOTE — Assessment & Plan Note (Addendum)
The patient's symptoms are consistent with an asthma exacerbation related to mold exposure.  She had a very specific inciting event with inhalation of mold from wet clothing.  She has had no fevers or cough to indicate an alternative cause.  Overall her symptoms are not consistent with COVID-19 and there is no indication for social isolation. Discussed social distancing with the patient. Discussed adding prednisone.  Discussed that there is risk with prednisone of immunosuppression. Given reasons to contact us back.  Discussed reasons to seek medical attention in the ED. If she has recurrence or worsening of asthma issues moving forward we could consider Advair or Symbicort. Discussed having her report this to health at work. She noted that she has discussed this with her supervisor at work and notes that she was advised that given this was related to an allergy issue that as long as she had no fever she would be ok to work.

## 2018-05-23 NOTE — Telephone Encounter (Signed)
Called and spoke with pt. Pt has been set up for webex and pt confirmed e-mail was received

## 2018-05-23 NOTE — Progress Notes (Addendum)
Virtual Visit via Video Note  I connected with  on 05/23/18 at  3:30 PM EDT by a video enabled telemedicine application and verified that I am speaking with the correct person using two identifiers. Location patient: home Location provider: work Persons participating in the virtual visit: patient, provider  I discussed the limitations of evaluation and management by telemedicine and the availability of in person appointments. The patient expressed understanding and agreed to proceed.  The patient [facility]) was at home and I Marikay Alar) was in the office. We were the participants in this visit.   Reason for visit: Asthma  HPI: Asthma: Patient notes she has had more issues with her asthma over the last 3 to 4 days.  She has had a mold issue at her house and had a leak.  She was cleaning out the back room and picked up some clothing that had been wet and moldy.  She lifted it up to her face to sniff it and immediately reacted to it with some tightness of her airways and shortness of breath.  She has had no wheezing.  She has had no fevers.  She has been checking her temperature twice daily.  She has had no cough.  She doubled up on her Qvar dose and has been using her albuterol 12 times a day.  She did self medicate with prednisone and took 50 mg the first day and 30 mg a second day. She has improved with use of prednisone. She notes she opened up her window to help air circulate and that has been beneficial as well.  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  She is following self-isolation at home.  She has had no COVID-19 exposure.  She has had no travel in the last month.  She notes this is typical of her prior asthma issues.  She wonders if she could benefit from addition of a LABA.   ROS: See pertinent positives and negatives per HPI.  Past Medical History:  Diagnosis Date  . Asthma   . Narcolepsy   . Symptomatic PVCs     Past Surgical  History:  Procedure Laterality Date  . WISDOM TOOTH EXTRACTION      Family History  Problem Relation Age of Onset  . Heart disease Maternal Grandmother   . Stroke Maternal Grandmother   . Hyperlipidemia Father     SOCIAL HX: nonsmoker   Current Outpatient Medications:  .  albuterol (PROVENTIL HFA;VENTOLIN HFA) 108 (90 Base) MCG/ACT inhaler, Inhale 1 puff into the lungs every 4 (four) hours as needed for wheezing or shortness of breath., Disp: 1 Inhaler, Rfl: 6 .  amphetamine-dextroamphetamine (ADDERALL XR) 5 MG 24 hr capsule, Take 5 mg by mouth daily., Disp: , Rfl:  .  beclomethasone (QVAR REDIHALER) 40 MCG/ACT inhaler, INHALE 2 PUFFS INTO THE LUNGS TWO TIMES DAILY, Disp: 10.6 g, Rfl: 2 .  Butoconazole Nitrate, 1 Dose, (GYNAZOLE-1) 2 % CREA, Place 1 Applicatorful vaginally as needed., Disp: 5 g, Rfl: 4 .  Clobetasol Propionate 0.05 % shampoo, APPLY TO AFFECTED AREA(S) AS NEEDED., Disp: 118 mL, Rfl: 3 .  dexmethylphenidate (FOCALIN) 5 MG tablet, Take 5 mg by mouth daily., Disp: , Rfl:  .  LARIN 24 FE 1-20 MG-MCG(24) tablet, TAKE 1 TABLET BY MOUTH DAILY. TAKE CONTINUOUSLY AS DIRECTED., Disp: 28 tablet, Rfl: 11 .  meclizine (ANTIVERT) 25 MG tablet, Take 1 tablet (25 mg total) by mouth 3 (three) times daily as  needed., Disp: 15 tablet, Rfl: 0 .  Sodium Oxybate (XYREM) 500 MG/ML SOLN, Take by mouth 2 (two) times daily., Disp: , Rfl:  .  predniSONE (DELTASONE) 20 MG tablet, Take 2 tablets (40 mg total) by mouth daily with breakfast., Disp: 10 tablet, Rfl: 0  EXAM:  VITALS per patient if applicable: pulse ox >95%  GENERAL: alert, oriented, appears well and in no acute distress  HEENT: atraumatic, conjunttiva clear, no obvious abnormalities on inspection of external nose and ears  NECK: normal movements of the head and neck  LUNGS: on inspection no signs of respiratory distress, breathing rate appears normal, no obvious gross SOB, gasping or wheezing  CV: no obvious cyanosis  MS:  moves all visible extremities without noticeable abnormality  PSYCH/NEURO: pleasant and cooperative, no obvious depression or anxiety, speech and thought processing grossly intact  ASSESSMENT AND PLAN:  Discussed the following assessment and plan:  Mild intermittent asthma with exacerbation  Asthma exacerbation The patient's symptoms are consistent with an asthma exacerbation related to mold exposure.  She had a very specific inciting event with inhalation of mold from wet clothing.  She has had no fevers or cough to indicate an alternative cause.  Overall her symptoms are not consistent with COVID-19 and there is no indication for social isolation. Discussed social distancing with the patient. Discussed adding prednisone.  Discussed that there is risk with prednisone of immunosuppression. Given reasons to contact us back.  Discussed reasons to seek medical attention in the ED. If she has recurrence or worsening of asthma issues moving forward we could consider Advair or Symbicort. Discussed having her report this to health at work. She noted that she has discussed this with her supervisor at work and notes that she was advised that given this was related to an allergy issue that as long as she had no fever she would be ok to work.      I discussed the assessment and treatment plan with the patient. The patient was provided an opportunity to ask questions and all were answered. The patient agreed with the plan and demonstrated an understanding of the instructions.   The patient was advised to call back or seek an in-person evaluation if the symptoms worsen or if the condition fails to improve as anticipated.   Marikay Alar, MD

## 2018-06-23 MED FILL — BLISOVI 24 FE 1-20 MG-MCG(2: 1-20 | 21 days supply | Qty: 28 | Fill #0

## 2018-07-15 DIAGNOSIS — G47411 Narcolepsy with cataplexy: Secondary | ICD-10-CM | POA: Diagnosis not present

## 2018-07-17 ENCOUNTER — Other Ambulatory Visit: Payer: Self-pay

## 2018-07-17 MED ORDER — BUTOCONAZOLE NITRATE (1 DOSE) 2 % VA CREA: 1.0000 | TOPICAL_CREAM | Freq: Once | VAGINAL | 0 refills | Status: AC

## 2018-08-01 ENCOUNTER — Other Ambulatory Visit: Payer: Self-pay | Admitting: Obstetrics and Gynecology

## 2018-09-03 ENCOUNTER — Other Ambulatory Visit: Payer: Self-pay | Admitting: Obstetrics and Gynecology

## 2018-10-08 ENCOUNTER — Other Ambulatory Visit: Payer: Self-pay

## 2018-10-08 ENCOUNTER — Other Ambulatory Visit (HOSPITAL_COMMUNITY)
Admission: RE | Admit: 2018-10-08 | Discharge: 2018-10-08 | Disposition: A | Discharge status: Home / Self Care | Payer: 59 | Source: Ambulatory Visit | Attending: Obstetrics and Gynecology | Admitting: Obstetrics and Gynecology

## 2018-10-08 ENCOUNTER — Encounter: Payer: Self-pay | Admitting: Obstetrics and Gynecology

## 2018-10-08 ENCOUNTER — Ambulatory Visit (INDEPENDENT_AMBULATORY_CARE_PROVIDER_SITE_OTHER): Payer: 59 | Admitting: Obstetrics and Gynecology

## 2018-10-08 VITALS — BP 120/84 | HR 61 | Ht 66.0 in | Wt 133.9 lb

## 2018-10-08 DIAGNOSIS — Z01419 Encounter for gynecological examination (general) (routine) without abnormal findings: Secondary | ICD-10-CM

## 2018-10-08 DIAGNOSIS — B373 Candidiasis of vulva and vagina: Secondary | ICD-10-CM

## 2018-10-08 DIAGNOSIS — B3731 Acute candidiasis of vulva and vagina: Secondary | ICD-10-CM

## 2018-10-08 NOTE — Patient Instructions (Signed)
 Preventive Care 21-34 Years Old, Female Preventive care refers to visits with your health care provider and lifestyle choices that can promote health and wellness. This includes:  A yearly physical exam. This may also be called an annual well check.  Regular dental visits and eye exams.  Immunizations.  Screening for certain conditions.  Healthy lifestyle choices, such as eating a healthy diet, getting regular exercise, not using drugs or products that contain nicotine and tobacco, and limiting alcohol use. What can I expect for my preventive care visit? Physical exam Your health care provider will check your:  Height and weight. This may be used to calculate body mass index (BMI), which tells if you are at a healthy weight.  Heart rate and blood pressure.  Skin for abnormal spots. Counseling Your health care provider may ask you questions about your:  Alcohol, tobacco, and drug use.  Emotional well-being.  Home and relationship well-being.  Sexual activity.  Eating habits.  Work and work environment.  Method of birth control.  Menstrual cycle.  Pregnancy history. What immunizations do I need?  Influenza (flu) vaccine  This is recommended every year. Tetanus, diphtheria, and pertussis (Tdap) vaccine  You may need a Td booster every 10 years. Varicella (chickenpox) vaccine  You may need this if you have not been vaccinated. Human papillomavirus (HPV) vaccine  If recommended by your health care provider, you may need three doses over 6 months. Measles, mumps, and rubella (MMR) vaccine  You may need at least one dose of MMR. You may also need a second dose. Meningococcal conjugate (MenACWY) vaccine  One dose is recommended if you are age 19-21 years and a first-year college student living in a residence hall, or if you have one of several medical conditions. You may also need additional booster doses. Pneumococcal conjugate (PCV13) vaccine  You may need  this if you have certain conditions and were not previously vaccinated. Pneumococcal polysaccharide (PPSV23) vaccine  You may need one or two doses if you smoke cigarettes or if you have certain conditions. Hepatitis A vaccine  You may need this if you have certain conditions or if you travel or work in places where you may be exposed to hepatitis A. Hepatitis B vaccine  You may need this if you have certain conditions or if you travel or work in places where you may be exposed to hepatitis B. Haemophilus influenzae type b (Hib) vaccine  You may need this if you have certain conditions. You may receive vaccines as individual doses or as more than one vaccine together in one shot (combination vaccines). Talk with your health care provider about the risks and benefits of combination vaccines. What tests do I need?  Blood tests  Lipid and cholesterol levels. These may be checked every 5 years starting at age 20.  Hepatitis C test.  Hepatitis B test. Screening  Diabetes screening. This is done by checking your blood sugar (glucose) after you have not eaten for a while (fasting).  Sexually transmitted disease (STD) testing.  BRCA-related cancer screening. This may be done if you have a family history of breast, ovarian, tubal, or peritoneal cancers.  Pelvic exam and Pap test. This may be done every 3 years starting at age 21. Starting at age 30, this may be done every 5 years if you have a Pap test in combination with an HPV test. Talk with your health care provider about your test results, treatment options, and if necessary, the need for more   tests. Follow these instructions at home: Eating and drinking   Eat a diet that includes fresh fruits and vegetables, whole grains, lean protein, and low-fat dairy.  Take vitamin and mineral supplements as recommended by your health care provider.  Do not drink alcohol if: ? Your health care provider tells you not to drink. ? You are  pregnant, may be pregnant, or are planning to become pregnant.  If you drink alcohol: ? Limit how much you have to 0-1 drink a day. ? Be aware of how much alcohol is in your drink. In the [place] one drink equals one 12 oz bottle of beer (355 mL), one 5 oz glass of wine (148 mL), or one 1 oz glass of hard liquor (44 mL). Lifestyle  Take daily care of your teeth and gums.  Stay active. Exercise for at least 30 minutes on 5 or more days each week.  Do not use any products that contain nicotine or tobacco, such as cigarettes, e-cigarettes, and chewing tobacco. If you need help quitting, ask your health care provider.  If you are sexually active, practice safe sex. Use a condom or other form of birth control (contraception) in order to prevent pregnancy and STIs (sexually transmitted infections). If you plan to become pregnant, see your health care provider for a preconception visit. What's next?  Visit your health care provider once a year for a well check visit.  Ask your health care provider how often you should have your eyes and teeth checked.  Stay up to date on all vaccines. This information is not intended to replace advice given to you by your health care provider. Make sure you discuss any questions you have with your health care provider. Document Released: 04/03/2001 Document Revised: 10/17/2017 Document Reviewed: 10/17/2017 Elsevier Patient Education  2020 [employer].

## 2018-10-08 NOTE — Addendum Note (Signed)
Addended by: Keturah Barre L on: 10/08/2018 09:54 AM   Modules accepted: Orders

## 2018-10-08 NOTE — Progress Notes (Signed)
Subjective:   Tanya NakaiSheema Glenn is a 33 y.o. G0P0000 Caucasian female here for a routine well-woman exam.  No LMP recorded. (Menstrual status: Oral contraceptives).    Current complaints: still having frequent yeast infection, last treated with gention violet 2 weeks ago.  PCP: [place]       PCPdose labs  Social History: Sexual: heterosexual Marital Status: co-habitating Living situation: with partner / significant other Occupation: Teacher, early years/prepharmacist Tobacco/alcohol: no tobacco use Illicit drugs: no history of illicit drug use  The following portions of the patient's history were reviewed and updated as appropriate: allergies, current medications, past family history, past medical history, past social history, past surgical history and problem list.  Past Medical History Past Medical History:  Diagnosis Date  . Asthma   . Narcolepsy   . Symptomatic PVCs     Past Surgical History Past Surgical History:  Procedure Laterality Date  . WISDOM TOOTH EXTRACTION      Gynecologic History G0P0000  No LMP recorded. (Menstrual status: Oral contraceptives). Contraception: OCP (estrogen/progesterone) Last Pap: 09/2017. Results were: normal   Obstetric History OB History  Gravida Para Term Preterm AB Living  0 0 0 0 0 0  SAB TAB Ectopic Multiple Live Births  0 0 0 0      Current Medications Current Outpatient Medications on File Prior to Visit  Medication Sig Dispense Refill  . albuterol (PROVENTIL HFA;VENTOLIN HFA) 108 (90 Base) MCG/ACT inhaler Inhale 1 puff into the lungs every 4 (four) hours as needed for wheezing or shortness of breath. 1 Inhaler 6  . amphetamine-dextroamphetamine (ADDERALL XR) 5 MG 24 hr capsule Take 5 mg by mouth daily.    . beclomethasone (QVAR REDIHALER) 40 MCG/ACT inhaler INHALE 2 PUFFS INTO THE LUNGS TWO TIMES DAILY 10.6 g 2  . Butoconazole Nitrate, 1 Dose, (GYNAZOLE-1) 2 % CREA Place 1 Applicatorful vaginally as needed. 5 g 4  . Clobetasol Propionate 0.05 %  shampoo APPLY TO AFFECTED AREA(S) AS NEEDED. 118 mL 3  . dexmethylphenidate (FOCALIN) 5 MG tablet Take 5 mg by mouth daily.    Marland Kitchen. LARIN 24 FE 1-20 MG-MCG(24) tablet TAKE 1 TABLET BY MOUTH DAILY. TAKE CONTINUOUSLY AS DIRECTED. 28 tablet 4  . meclizine (ANTIVERT) 25 MG tablet Take 1 tablet (25 mg total) by mouth 3 (three) times daily as needed. 15 tablet 0  . Sodium Oxybate (XYREM) 500 MG/ML SOLN Take by mouth 2 (two) times daily.     No current facility-administered medications on file prior to visit.     Review of Systems Patient denies any headaches, blurred vision, shortness of breath, chest pain, abdominal pain, problems with bowel movements, urination, or intercourse.  Objective:  BP 120/84   Pulse 61   Ht 5\' 6"  (1.676 m)   Wt 133 lb 14.4 oz (60.7 kg)   BMI 21.61 kg/m  Physical Exam  General:  Well developed, well nourished, no acute distress. She is alert and oriented x3. Skin:  Warm and dry Neck:  Midline trachea, no thyromegaly or nodules Cardiovascular: Regular rate and rhythm, no murmur heard Lungs:  Effort normal, all lung fields clear to auscultation bilaterally Breasts:  No dominant palpable mass, retraction, or nipple discharge Abdomen:  Soft, non tender, no hepatosplenomegaly or masses Pelvic:  External genitalia is normal in appearance.  The vagina is normal in appearance. The cervix is bulbous, no CMT.  Thin prep pap is not done. Uterus is felt to be normal size, shape, and contour.  No adnexal masses or tenderness noted.  Microscopic wet-mount exam shows lactobacilli, monilia. Extremities:  No swelling or varicosities noted Psych:  She has a normal mood and affect  Assessment:   Healthy well-woman exam Yeast infection  Plan:  Counseled on findings, will use gynezole 1 prescription that she already has.  F/U 1 year for AE, or sooner if needed  Melody Rockney Ghee, CNM

## 2018-10-10 LAB — CYTOLOGY - PAP: Diagnosis: NEGATIVE

## 2018-10-28 ENCOUNTER — Other Ambulatory Visit: Payer: Self-pay | Admitting: *Deleted

## 2018-10-28 MED ORDER — GYNAZOLE-1 2 % VA CREA: 1.0000 | TOPICAL_CREAM | Freq: Every evening | VAGINAL | Status: DC

## 2018-10-30 ENCOUNTER — Encounter: Payer: Self-pay | Admitting: *Deleted

## 2018-10-31 ENCOUNTER — Encounter: Payer: Self-pay | Admitting: Family Medicine

## 2018-10-31 ENCOUNTER — Other Ambulatory Visit: Payer: Self-pay

## 2018-10-31 ENCOUNTER — Encounter: Payer: 59 | Admitting: Obstetrics and Gynecology

## 2018-10-31 ENCOUNTER — Ambulatory Visit (INDEPENDENT_AMBULATORY_CARE_PROVIDER_SITE_OTHER): Payer: 59 | Admitting: Family Medicine

## 2018-10-31 VITALS — BP 120/80 | HR 64 | Temp 98.4°F | Ht 66.0 in | Wt 133.4 lb

## 2018-10-31 DIAGNOSIS — G47419 Narcolepsy without cataplexy: Secondary | ICD-10-CM

## 2018-10-31 DIAGNOSIS — B373 Candidiasis of vulva and vagina: Secondary | ICD-10-CM | POA: Diagnosis not present

## 2018-10-31 DIAGNOSIS — F419 Anxiety disorder, unspecified: Secondary | ICD-10-CM | POA: Diagnosis not present

## 2018-10-31 DIAGNOSIS — R7309 Other abnormal glucose: Secondary | ICD-10-CM

## 2018-10-31 DIAGNOSIS — B3731 Acute candidiasis of vulva and vagina: Secondary | ICD-10-CM | POA: Insufficient documentation

## 2018-10-31 DIAGNOSIS — L409 Psoriasis, unspecified: Secondary | ICD-10-CM | POA: Diagnosis not present

## 2018-10-31 DIAGNOSIS — J4520 Mild intermittent asthma, uncomplicated: Secondary | ICD-10-CM

## 2018-10-31 MED ORDER — FLUCONAZOLE 150 MG PO TABS: 150.0000 mg | ORAL_TABLET | ORAL | 0 refills | Status: DC

## 2018-10-31 NOTE — Assessment & Plan Note (Signed)
Anxiety related to working in the COVID-19 pandemic.  Discussed medication and therapy though she deferred these currently.  She will continue to monitor.

## 2018-10-31 NOTE — Assessment & Plan Note (Signed)
She will continue with her current treatment.  Consider dermatology evaluation in the future if she would like this.

## 2018-10-31 NOTE — Patient Instructions (Signed)
Nice to see you. We will check an a1c.  Please try the diflucan and if this does not resolve your symptoms please contact GYN.

## 2018-10-31 NOTE — Progress Notes (Signed)
Tanya AlarEric Tylek Boney, MD Phone: [phone]  Tanya NakaiSheema Glenn is a 33 y.o. female who presents today for follow-up.  Asthma: This is stable.  She uses her albuterol only prior to exercise.  No cough, wheezing, or shortness of breath.  Narcolepsy: She notes this is stable as well.  She continues on Xyrem and Adderall.  She continues to follow with neurology.  Anxiety: She does note quite a bit of anxiety since COVID-19 has come on and with working first shift over the last 2 weeks this has been worse.  She is around more people with working first shift.  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  She does not want medication for this given her narcolepsy.  She declines therapy referral as she does not feel this would be beneficial.  Psoriasis: She has this on her forehead and scalp.  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  She uses clobetasol shampoo with relatively good benefit.  She is going to try switching to cleaner shampoos and other products.  She does dye her hair.  Yeast infection: Patient reports intermittent issues with this chronically.  She minimal discharge with itching.  Some burning after intercourse.  She has recently washed all of her clothing and dried them or steamed them with her iron.  She notes typically she will treat with a topical treatment and the symptoms will return after about a week.  She has tried Diflucan for 2 doses previously though not 3 doses.  Social History   Tobacco Use  Smoking Status Never Smoker  Smokeless Tobacco Never Used     ROS see history of present illness  Objective  Physical Exam Vitals:   10/31/18 1529  BP: 120/80  Pulse: 64  Temp: 98.4 F (36.9 C)  SpO2: 99%    BP Readings from Last 3 Encounters:  10/31/18 120/80  10/08/18 120/84  04/29/18 98/70   Wt Readings from Last 3 Encounters:  10/31/18 133 lb 6.4 oz (60.5 kg)  10/08/18  133 lb 14.4 oz (60.7 kg)  04/29/18 132 lb 6.4 oz (60.1 kg)    Physical Exam Constitutional:      General: She is not in acute distress.    Appearance: She is not diaphoretic.  Cardiovascular:     Rate and Rhythm: Normal rate and regular rhythm.     Heart sounds: Normal heart sounds.  Pulmonary:     Effort: Pulmonary effort is normal.     Breath sounds: Normal breath sounds.  Skin:    General: Skin is warm and dry.     Comments: Scattered areas of psoriasis in her anterior scalp  Neurological:     Mental Status: She is alert.      Assessment/Plan: Please see individual problem list.  Vaginal candidiasis Patient with recurrent issues with this.  We will trial Diflucan x3 doses as outlined below.  We will check an A1c to rule out underlying diabetes.  If not improved with Diflucan she will follow-up with her gynecologist.  Psoriasis of scalp She will continue with her current treatment.  Consider dermatology evaluation in the future if she would like this.  Asthma, mild intermittent Stable.  She will monitor for worsening symptoms.  Narcolepsy Stable.  She will continue to see neurology.  Anxiety Anxiety related to working in the COVID-19 pandemic.  Discussed medication and therapy though she deferred these  currently.  She will continue to monitor.   Orders Placed This Encounter  Procedures  . HgB A1c    Meds ordered this encounter  Medications  . fluconazole (DIFLUCAN) 150 MG tablet    Sig: Take 1 tablet (150 mg total) by mouth every 3 (three) days.    Dispense:  3 tablet    Refill:  0     Tommi Rumps, MD [place]

## 2018-10-31 NOTE — Assessment & Plan Note (Signed)
Stable.  She will continue to see neurology.

## 2018-10-31 NOTE — Assessment & Plan Note (Signed)
Stable.  She will monitor for worsening symptoms.

## 2018-10-31 NOTE — Assessment & Plan Note (Signed)
Patient with recurrent issues with this.  We will trial Diflucan x3 doses as outlined below.  We will check an A1c to rule out underlying diabetes.  If not improved with Diflucan she will follow-up with her gynecologist.

## 2018-11-01 LAB — HEMOGLOBIN A1C
Hgb A1c MFr Bld: 5.1 % of total Hgb (ref <5.7)
Mean Plasma Glucose: 100 (calc)
eAG (mmol/L): 5.5 (calc)

## 2018-11-04 ENCOUNTER — Encounter: Payer: Self-pay | Admitting: *Deleted

## 2018-11-18 ENCOUNTER — Encounter: Payer: Self-pay | Admitting: Obstetrics and Gynecology

## 2018-11-18 ENCOUNTER — Encounter: Payer: Self-pay | Admitting: *Deleted

## 2018-11-18 ENCOUNTER — Other Ambulatory Visit: Payer: Self-pay

## 2018-11-18 ENCOUNTER — Ambulatory Visit (INDEPENDENT_AMBULATORY_CARE_PROVIDER_SITE_OTHER): Payer: 59 | Admitting: Obstetrics and Gynecology

## 2018-11-18 VITALS — BP 121/93 | HR 98 | Ht 66.0 in | Wt 135.4 lb

## 2018-11-18 DIAGNOSIS — B3731 Acute candidiasis of vulva and vagina: Secondary | ICD-10-CM

## 2018-11-18 DIAGNOSIS — B373 Candidiasis of vulva and vagina: Secondary | ICD-10-CM

## 2018-11-18 MED ORDER — GYNAZOLE-1 2 % VA CREA: 1.0000 | TOPICAL_CREAM | VAGINAL | 4 refills | Status: DC | PRN

## 2018-11-18 MED ORDER — NECON 0.5/35 (28) 0.5-35 MG-MCG PO TABS: 1.0000 | ORAL_TABLET | Freq: Every day | ORAL | 11 refills | Status: DC

## 2018-11-18 NOTE — Progress Notes (Signed)
  Subjective:     Patient ID: Tanya Glenn, female   DOB: 09-24-1985, 33 y.o.   MRN: [id number]  HPI Here with c/o yeast infection. Has longstanding h/o re-current yeast infections.  Noticed symptoms about 3 weeks ago and treated with fluconazole x 3 doses. It got better, but itching returned 2 days ago. Is frustrated that they continue to re-occur.    Review of Systems  Genitourinary: Positive for vaginal discharge.  All other systems reviewed and are negative.      Objective:   Physical Exam A&Ox4 Well groomed female in no distress Vitals with BMI 11/18/2018 10/31/2018 10/08/2018  Height 5\' 6"  5\' 6"  5\' 6"   Weight 135 lbs 6 oz 133 lbs 6 oz 133 lbs 14 oz  BMI 21.86 62.83 15.17  Systolic 616 073 710  Diastolic 93 80 84  Pulse 98 64 61   Pelvic exam: VULVA: normal appearing vulva with no masses, tenderness or lesions, VAGINA: normal appearing vagina with normal color and discharge, no lesions, vaginal discharge - milky and scant, CERVIX: cervical discharge present - creamy, curd-like and scant, WET MOUNT done - results: lactobacilli, WET MOUNT done - results: lactobacilli.    Assessment:     Yeast infection    Plan:     Will do 2 treatments of gynezole1 a week apart and then return for recheck. Will switch OCPs to lower progestonic activity and will try necon. She is considering stopping altogether to try for pregnancy anyway.  Sander Remedios,CNM

## 2018-12-02 ENCOUNTER — Other Ambulatory Visit: Payer: Self-pay

## 2018-12-02 ENCOUNTER — Encounter: Payer: Self-pay | Admitting: Obstetrics and Gynecology

## 2018-12-02 ENCOUNTER — Ambulatory Visit (INDEPENDENT_AMBULATORY_CARE_PROVIDER_SITE_OTHER): Payer: 59 | Admitting: Obstetrics and Gynecology

## 2018-12-02 VITALS — BP 123/85 | HR 81 | Ht 66.5 in | Wt 134.5 lb

## 2018-12-02 DIAGNOSIS — B373 Candidiasis of vulva and vagina: Secondary | ICD-10-CM | POA: Diagnosis not present

## 2018-12-02 DIAGNOSIS — B3731 Acute candidiasis of vulva and vagina: Secondary | ICD-10-CM

## 2018-12-02 MED ORDER — NYSTATIN-TRIAMCINOLONE 100000-0.1 UNIT/GM-% EX OINT: 1.0000 "application " | TOPICAL_OINTMENT | Freq: Two times a day (BID) | CUTANEOUS | 1 refills | Status: DC

## 2018-12-02 MED ORDER — FLUCONAZOLE 150 MG PO TABS: 150.0000 mg | ORAL_TABLET | ORAL | 3 refills | Status: DC

## 2018-12-02 MED ORDER — NYSTATIN 100000 UNIT/GM EX CREA: 1.0000 "application " | TOPICAL_CREAM | Freq: Two times a day (BID) | CUTANEOUS | 2 refills | Status: DC

## 2018-12-02 MED ORDER — GYNAZOLE-1 2 % VA CREA: 1.0000 | TOPICAL_CREAM | Freq: Every evening | VAGINAL | Status: DC

## 2018-12-02 NOTE — Progress Notes (Signed)
Patient comes in today for recheck. She is still having vaginal discharge and itching.

## 2018-12-02 NOTE — Progress Notes (Signed)
  Subjective:     Patient ID: Tanya Glenn, female   DOB: 07/13/1985, 33 y.o.   MRN: [id number]  HPI presens with c/o yeast infection returning with increased discharge and itching x 4 days. Self treated with monistat 3 days ago and sill has symptoms plus feels very irritated and like she has had a reaction to monistat.  Review of Systems     Objective:   Physical Exam A&Ox4 Well groomed female in no distress Vitals with BMI 12/02/2018 11/18/2018 10/31/2018  Height 5' 6.5" 5\' 6"  5\' 6"   Weight 134 lbs 8 oz 135 lbs 6 oz 133 lbs 6 oz  BMI 21.39 78.67 54.49  Systolic 201 007 121  Diastolic 85 93 80  Pulse 81 98 64  Pelvic exam: VULVA: normal appearing vulva with no masses, tenderness or lesions, vulvar erythema in folds, VAGINA: vaginal erythema throughout, vaginal discharge - curd-like, milky and thin, CERVIX: normal appearing cervix without discharge or lesions, WET MOUNT done - results: hyphae, lactobacilli.    Assessment:     Yeast infection    Plan:     Will try extended treatment for  The yeast: butoconazole 1% nightly x 3 nights, combined with diflucan 150mg  daily x 7 days and nystation/triamncilone ointment 2 x day for at least a week.  Also will stop OCPs  To see if that decreases yeast infection occurance.   Haidar Muse,CNM

## 2018-12-18 ENCOUNTER — Encounter: Payer: Self-pay | Admitting: Family Medicine

## 2018-12-18 ENCOUNTER — Telehealth: Payer: Self-pay

## 2018-12-18 NOTE — Telephone Encounter (Signed)
I called the patient per Dr. Caryl Bis and scheduled a nurse visit with the patient for a Pneumonia-23 vaccine on Monday. Everest Brod,cma

## 2018-12-22 ENCOUNTER — Other Ambulatory Visit: Payer: Self-pay

## 2018-12-22 ENCOUNTER — Ambulatory Visit (INDEPENDENT_AMBULATORY_CARE_PROVIDER_SITE_OTHER): Payer: 59 | Admitting: *Deleted

## 2018-12-22 DIAGNOSIS — Z23 Encounter for immunization: Secondary | ICD-10-CM | POA: Diagnosis not present

## 2018-12-25 DIAGNOSIS — F4323 Adjustment disorder with mixed anxiety and depressed mood: Secondary | ICD-10-CM | POA: Diagnosis not present

## 2018-12-30 ENCOUNTER — Encounter: Payer: 59 | Admitting: Obstetrics and Gynecology

## 2019-01-03 DIAGNOSIS — F4323 Adjustment disorder with mixed anxiety and depressed mood: Secondary | ICD-10-CM | POA: Diagnosis not present

## 2019-01-06 ENCOUNTER — Encounter: Payer: Self-pay | Admitting: Family Medicine

## 2019-01-06 ENCOUNTER — Other Ambulatory Visit: Payer: Self-pay

## 2019-01-06 MED ORDER — QVAR REDIHALER 40 MCG/ACT IN AERB: INHALATION_SPRAY | RESPIRATORY_TRACT | 2 refills | Status: DC

## 2019-01-10 DIAGNOSIS — F4323 Adjustment disorder with mixed anxiety and depressed mood: Secondary | ICD-10-CM | POA: Diagnosis not present

## 2019-02-15 DIAGNOSIS — R109 Unspecified abdominal pain: Secondary | ICD-10-CM | POA: Diagnosis not present

## 2019-02-15 DIAGNOSIS — N760 Acute vaginitis: Secondary | ICD-10-CM | POA: Diagnosis not present

## 2019-02-16 ENCOUNTER — Other Ambulatory Visit: Payer: Self-pay

## 2019-02-16 ENCOUNTER — Encounter: Payer: Self-pay | Admitting: Obstetrics and Gynecology

## 2019-02-16 ENCOUNTER — Other Ambulatory Visit (HOSPITAL_COMMUNITY)
Admission: RE | Admit: 2019-02-16 | Discharge: 2019-02-16 | Disposition: A | Discharge status: Home / Self Care | Payer: 59 | Source: Ambulatory Visit | Attending: Obstetrics and Gynecology | Admitting: Obstetrics and Gynecology

## 2019-02-16 ENCOUNTER — Ambulatory Visit (INDEPENDENT_AMBULATORY_CARE_PROVIDER_SITE_OTHER): Payer: 59 | Admitting: Obstetrics and Gynecology

## 2019-02-16 VITALS — BP 118/78 | Ht 66.5 in | Wt 132.0 lb

## 2019-02-16 DIAGNOSIS — R1031 Right lower quadrant pain: Secondary | ICD-10-CM | POA: Diagnosis not present

## 2019-02-16 DIAGNOSIS — N83201 Unspecified ovarian cyst, right side: Secondary | ICD-10-CM

## 2019-02-16 NOTE — Progress Notes (Signed)
Obstetrics & Gynecology Office Visit   Chief Complaint  Patient presents with  . Pelvic Pain   History of Present Illness: 33 y.o. G0P0000 female who presents with lower abdominal pain.  The pain is in her lower abdomen. The onset of the pain was about 2 weeks ago.  Her LMP was around 12/14. She had intercourse on day 3 of her cycle. The next day her pain began.  Her pain is generally in her lower abdomen.  She describes her pain as more like a muscle burn when it's constant.  Her pain can become worse.  Around the 19th she had more severe pain (it caused her to buckle).  She states lasted about 20 minutes.  She fell asleep and the pain was gone when she woke up.  She rates her pain as 4/10.  Her pain doesn't radiate. Though, this morning her back is more uncomfortable.    She has been using gentian violet vaginally due to vaginal symptoms.  She went to Urgent Care yesterday.  She had a pelvic exam and she was told that part of her cervix looked a "little off."  She was given metrogel.  She states she only had a pH swab.  She was treated with metrogel last night.  She had a UA performed and it was negative. She denies urinary symptoms. She denies frequency, urgency, dysuria.  She did note that on Saturday when her bladder was more full, her pain was a little more intense bilaterally.  Alleviating factors: nothing.  Aggravating factors: after her pelvic exam she had more pain.  Maybe having a bowel movement.  Associated symptoms: she denies fevers, chills, nausea, vomiting, diarrhea, constipation, hematochezia, , melena, hematuria. She denies vaginal irritative symptoms of itching, burning, irritation since she started using gentian violet.  She has never had this happen before.  She stopped her birth control pills about 2 months ago, which she had been taking since age 37. She was taking lo estrin.  Her periods since that time have been a little off in timing, and she has been cramping severely.  She  cramped for a day or so with the first and 3 days with the second. She usually takes ibuprofen for her cramping.  She thought ibuprofen might've been helping three days ago. She has not pinpointed a certain part of the day during which her pain is worse.  The right side is the side that has been bothering her the most in the recent days.   Pap smear 09/2018 was normal. No STD screening performed.   Past Medical History:  Diagnosis Date  . Asthma   . Narcolepsy   . Symptomatic PVCs    Past Surgical History:  Procedure Laterality Date  . WISDOM TOOTH EXTRACTION      Gynecologic History: Patient's last menstrual period was 02/02/2019.  Obstetric History: G0P0000  Family History  Problem Relation Age of Onset  . Heart disease Maternal Grandmother   . Stroke Maternal Grandmother   . Hyperlipidemia Father     Social History   Socioeconomic History  . Marital status: Single    Spouse name: Not on file  . Number of children: Not on file  . Years of education: Not on file  . Highest education level: Not on file  Occupational History  . Occupation: Pharmacist  Tobacco Use  . Smoking status: Never Smoker  . Smokeless tobacco: Never Used  Substance and Sexual Activity  . Alcohol use: Yes    Comment:  occasional drink on the weekend.  . Drug use: No  . Sexual activity: Yes    Birth control/protection: Pill  Other Topics Concern  . Not on file  Social History Narrative   Lives in [place].  Exercises - wt training - 4+ x /wk.     Social Determinants of Health   Financial Resource Strain:   . Difficulty of Paying Living Expenses: Not on file  Food Insecurity:   . Worried About Programme researcher, broadcasting/film/videounning Out of Food in the Last Year: Not on file  . Ran Out of Food in the Last Year: Not on file  Transportation Needs:   . Lack of Transportation (Medical): Not on file  . Lack of Transportation (Non-Medical): Not on file  Physical Activity:   . Days of Exercise per Week: Not on file  . Minutes of  Exercise per Session: Not on file  Stress:   . Feeling of Stress : Not on file  Social Connections:   . Frequency of Communication with Friends and Family: Not on file  . Frequency of Social Gatherings with Friends and Family: Not on file  . Attends Religious Services: Not on file  . Active Member of Clubs or Organizations: Not on file  . Attends BankerClub or Organization Meetings: Not on file  . Marital Status: Not on file  Intimate Partner Violence:   . Fear of Current or Ex-Partner: Not on file  . Emotionally Abused: Not on file  . Physically Abused: Not on file  . Sexually Abused: Not on file    Allergies  Allergen Reactions  . Sulfa Antibiotics Hives   Prior to Admission medications   Medication Sig Start Date End Date Taking? Authorizing Provider  albuterol (PROVENTIL HFA;VENTOLIN HFA) 108 (90 Base) MCG/ACT inhaler Inhale 1 puff into the lungs every 4 (four) hours as needed for wheezing or shortness of breath. 02/17/18  Yes Shambley, Melody N, CNM  Sodium Oxybate (XYREM) 500 MG/ML SOLN Take by mouth 2 (two) times daily.   Yes [provider]    Review of Systems  Constitutional: Negative.   HENT: Negative.   Eyes: Negative.   Respiratory: Negative.   Cardiovascular: Negative.   Gastrointestinal: Positive for abdominal pain. Negative for blood in stool, constipation, diarrhea, heartburn, melena, nausea and vomiting.  Genitourinary: Negative.   Musculoskeletal: Negative.   Skin: Negative.   Neurological: Negative.   Psychiatric/Behavioral: Negative.      Physical Exam BP 118/78   Ht 5' 6.5" (1.689 m)   Wt 132 lb (59.9 kg)   LMP 02/02/2019   BMI 20.99 kg/m  Patient's last menstrual period was 02/02/2019. Physical Exam Constitutional:      General: She is not in acute distress.    Appearance: Normal appearance. She is well-developed.  Genitourinary:     Pelvic exam was performed with patient in the lithotomy position.     Vulva, inguinal canal, urethra,  bladder, vagina and uterus normal.     No posterior fourchette tenderness, injury or lesion present.     No cervical motion tenderness, friability, lesion, bleeding or polyp.     Uterus is retroverted.     Right adnexa full.     Left adnexa full.  HENT:     Head: Normocephalic and atraumatic.  Eyes:     General: No scleral icterus.    Conjunctiva/sclera: Conjunctivae normal.  Cardiovascular:     Rate and Rhythm: Normal rate and regular rhythm.     Heart sounds: No murmur. No friction  rub. No gallop.   Pulmonary:     Effort: Pulmonary effort is normal. No respiratory distress.     Breath sounds: Normal breath sounds. No wheezing or rales.  Abdominal:     General: Bowel sounds are normal. There is no distension.     Palpations: Abdomen is soft. There is no mass.     Tenderness: There is no abdominal tenderness. There is no guarding or rebound.  Musculoskeletal:        General: Normal range of motion.     Cervical back: Normal range of motion and neck supple.  Neurological:     General: No focal deficit present.     Mental Status: She is alert and oriented to person, place, and time.     Cranial Nerves: No cranial nerve deficit.  Skin:    General: Skin is warm and dry.     Findings: No erythema.  Psychiatric:        Mood and Affect: Mood normal.        Behavior: Behavior normal.        Judgment: Judgment normal.    TVUS (performed by me) Uterus is retroverted Endometrial stripe is approximately 4.5 mm Uterus appears to be arcuate Left ovary: Not visualized due to bowel gas.  Right ovary: right ovarian cyst present, measuring approximately 4.5 x 4.0 x 4.5 cm cystic structure with lattice-like reticular echoes present.   No free fluid in pelvis  Female chaperone present for pelvic and breast  portions of the physical exam and ultrasound  Wet Prep: PH: not done Clue Cells: Positive Fungal elements: Negative Trichomonas: Negative   Assessment: 33 y.o. G0P0000 female here  for  1. Right lower quadrant abdominal pain   2. Right ovarian cyst      Plan: Problem List Items Addressed This Visit    None    Visit Diagnoses    Right lower quadrant abdominal pain    -  Primary   Relevant Orders   US Transvaginal Non-OB ([facility])   Cervicovaginal ancillary only   Right ovarian cyst       Relevant Orders   US Transvaginal Non-OB ([facility])     Discussed findings on ultrasound. Discussed risk of ovarian torsion given presence of cyst of this size.  Precautions given for ovarian torsion.  Recommendation to follow up cyst in 2-3 months given, as the sonographic characteristics most similar to hemorrhagic ovarian cyst. Less likely mature cystic teratoma and endometrioma vs mucinous cystadenoma or serous cystadenoma.  Will follow up for resolution. If no resolution or worsening symptoms, may have to consider surgical removal. Cyst is most likely to resolve itself. We also discussed that while this cyst is present other etiologies of significant abdominal pain can be present, such as appendicitis. So, in the case of acute worsening of symptoms, patient instructed to go to the ER.  To be thorough an STD screen was performed.  Wet prep showed some clue cells, also with some rod-like structures suggesting lactobacilli.  I would recommend continuing metrogel as my result today could've been compromised by her single dose of metrogel last night.     Prentice Docker, MD 02/16/2019 2:18 PM

## 2019-02-18 LAB — CERVICOVAGINAL ANCILLARY ONLY
Chlamydia: NEGATIVE
Comment: NEGATIVE
Comment: NEGATIVE
Comment: NORMAL
Neisseria Gonorrhea: NEGATIVE
Trichomonas: NEGATIVE

## 2019-04-20 ENCOUNTER — Other Ambulatory Visit: Payer: 59

## 2019-04-20 ENCOUNTER — Ambulatory Visit: Payer: 59 | Admitting: Obstetrics and Gynecology

## 2019-04-23 ENCOUNTER — Other Ambulatory Visit: Payer: Self-pay

## 2019-04-23 ENCOUNTER — Ambulatory Visit (INDEPENDENT_AMBULATORY_CARE_PROVIDER_SITE_OTHER): Payer: 59

## 2019-04-23 ENCOUNTER — Ambulatory Visit (INDEPENDENT_AMBULATORY_CARE_PROVIDER_SITE_OTHER): Payer: 59 | Admitting: Obstetrics and Gynecology

## 2019-04-23 VITALS — BP 122/70 | Ht 66.0 in | Wt 137.0 lb

## 2019-04-23 DIAGNOSIS — N979 Female infertility, unspecified: Secondary | ICD-10-CM

## 2019-04-23 DIAGNOSIS — N83201 Unspecified ovarian cyst, right side: Secondary | ICD-10-CM

## 2019-04-23 DIAGNOSIS — R1031 Right lower quadrant pain: Secondary | ICD-10-CM | POA: Diagnosis not present

## 2019-04-23 NOTE — Progress Notes (Signed)
Gynecology Ultrasound Follow Up   Chief Complaint  Patient presents with  . Follow-up  Pelvic pain, right ovarian cyst   History of Present Illness: Patient is a 34 y.o. female who presents today for ultrasound evaluation of the above.  Ultrasound demonstrates the following findings Adnexa: no masses seen  Uterus: retroverted with endometrial stripe  2.5 mm Additional: cystic area within posterior myometrium (see report below).  Resolution of right ovarian cyst.   She has been tracking ovulation and entering the data in her phone. Since she stopped her combined OCPs her cycle length has been slowly extending from 24 days to 26 days, etc.  She states that in the few months she has been off oral contraceptive pills she has only had one strong ovulation result month.  The other months indicate a rise in urine LH.  However, according to the system she is using, the level is never high or strong.  She is concerned because she is turning 34 next month and wants to make sure she does not waste time before using more advanced options, if she needs them.   From a pain standpoint, the pain has improved overall since her last visit.   Since stopping her narcolepsy medication, she notes a weight gain, which is also concerning for her because she does not sleep as well as she normally does.   Past Medical History:  Diagnosis Date  . Asthma   . Narcolepsy   . Symptomatic PVCs     Past Surgical History:  Procedure Laterality Date  . WISDOM TOOTH EXTRACTION      Family History  Problem Relation Age of Onset  . Heart disease Maternal Grandmother   . Stroke Maternal Grandmother   . Hyperlipidemia Father     Social History   Socioeconomic History  . Marital status: Married    Spouse name: Not on file  . Number of children: Not on file  . Years of education: Not on file  . Highest education level: Not on file  Occupational History  . Occupation: Pharmacist  Tobacco Use  . Smoking  status: Never Smoker  . Smokeless tobacco: Never Used  Substance and Sexual Activity  . Alcohol use: Yes    Comment: occasional drink on the weekend.  . Drug use: No  . Sexual activity: Yes    Birth control/protection: Pill  Other Topics Concern  . Not on file  Social History Narrative   Lives in [place].  Exercises - wt training - 4+ x /wk.     Social Determinants of Health   Financial Resource Strain:   . Difficulty of Paying Living Expenses: Not on file  Food Insecurity:   . Worried About Programme researcher, broadcasting/film/video in the Last Year: Not on file  . Ran Out of Food in the Last Year: Not on file  Transportation Needs:   . Lack of Transportation (Medical): Not on file  . Lack of Transportation (Non-Medical): Not on file  Physical Activity:   . Days of Exercise per Week: Not on file  . Minutes of Exercise per Session: Not on file  Stress:   . Feeling of Stress : Not on file  Social Connections:   . Frequency of Communication with Friends and Family: Not on file  . Frequency of Social Gatherings with Friends and Family: Not on file  . Attends Religious Services: Not on file  . Active Member of Clubs or Organizations: Not on file  . Attends Club or  Organization Meetings: Not on file  . Marital Status: Not on file  Intimate Partner Violence:   . Fear of Current or Ex-Partner: Not on file  . Emotionally Abused: Not on file  . Physically Abused: Not on file  . Sexually Abused: Not on file    Allergies  Allergen Reactions  . Sulfa Antibiotics Hives    Prior to Admission medications   Medication Sig Start Date End Date Taking? Authorizing Provider  albuterol (PROVENTIL HFA;VENTOLIN HFA) 108 (90 Base) MCG/ACT inhaler Inhale 1 puff into the lungs every 4 (four) hours as needed for wheezing or shortness of breath. 02/17/18   Shambley, Melody N, CNM  amphetamine-dextroamphetamine (ADDERALL XR) 5 MG 24 hr capsule Take 5 mg by mouth daily.    [provider]  beclomethasone (QVAR  REDIHALER) 40 MCG/ACT inhaler INHALE 2 PUFFS INTO THE LUNGS TWO TIMES DAILY 01/06/19   Leone Haven, MD  Clobetasol Propionate 0.05 % shampoo APPLY TO AFFECTED AREA(S) AS NEEDED. 01/22/18   Shambley, Melody N, CNM  dexmethylphenidate (FOCALIN) 5 MG tablet Take 5 mg by mouth daily.    [provider]  meclizine (ANTIVERT) 25 MG tablet Take 1 tablet (25 mg total) by mouth 3 (three) times daily as needed. 05/07/17   Schuyler Amor, MD  Sodium Oxybate (XYREM) 500 MG/ML SOLN Take by mouth 2 (two) times daily.    [provider]    Physical Exam BP 122/70   Ht 5\' 6"  (1.676 m)   Wt 137 lb (62.1 kg)   BMI 22.11 kg/m    General: NAD HEENT: normocephalic, anicteric Pulmonary: No increased work of breathing Extremities: no edema, erythema, or tenderness Neurologic: Grossly intact, normal gait Psychiatric: mood appropriate, affect full  Imaging Results US Transvaginal Non-OB [facility])  Result Date: 04/23/2019 Patient Name: Tanya Glenn DOB: 12-02-1985 MRN: [id number] ULTRASOUND REPORT Location: [facility] Date of Service: 04/23/2019 Indications: evaluate for an ovarian cyst Findings: The uterus is retroverted and measures 7.9 x 5.4 x 4.1 cm. Echo texture is homogenous without evidence of focal masses. There is a cystic area within the myometrium measuring 3.3 x 5.1 mm The Endometrium measures 2.5 mm. Right Ovary measures 3.0 x 2.3 x 2.2 cm. It is normal in appearance. Left Ovary measures 2.7 x 1.5 x 1.5 cm. It is normal in appearance. Survey of the adnexa demonstrates no adnexal masses. There is a small amount of free fluid measuring 22.4 x 6.7 x 14.6 mm Impression: 1. Retroverted uterus 2. Resolution of right ovarian cyst 3. Cystic area within the posterior myometrium.  Differential considerations include; artifact, adenomyoma, adenomyosis. 4. Small amount of free fluid in pelvis, likely physiologic. Gweneth Dimitri, RT The ultrasound images and findings were reviewed by me and I  agree with the above report. Prentice Docker, MD, [facility], [facility] 04/23/2019 11:38 AM      Assessment: 34 y.o. G0P0000  1. Right ovarian cyst   2. Subfertility, female      Plan: Problem List Items Addressed This Visit    None    Visit Diagnoses    Right ovarian cyst    -  Primary   Subfertility, female       Relevant Orders   Anti mullerian hormone   Progesterone     Right ovarian cyst: resolved at this time along with her more severe symptoms of pelvic pain.  Subfertility:  Discussed that normally we allow 1 year for attempting conception prior to attempting workup.  The exception being in women who are 35 years old, which will be her case in 1 month.  Discussed initial workup for infertility, subfertility.  Initial workup includes checking mid-luteal phase progesterone level, HSG (hysterosalpingogram), and semen analysis.  We further discussed the use of ovulation induction in a woman who is not obviously ovulating monthly.  Discussed letrozole and clomid medications.  Discussed the basic use.  Briefly, we reviewed the increased risk of multiples that is especially present in clomid.  After discussion, will start with an AMH level and a mid-luteal phase progesterone level. Will consider further steps from there.    20 minutes spent in face to face discussion with > 50% spent in counseling,management, and coordination of care of her right ovarian cyst and subfertility in female (possible in the couple).   Follow up, as needed, through MyChart. In person for issues requiring an exam.  I will follow up with her lab results, also.   Thomasene Mohair, MD, [facility], [facility] 04/24/2019 11:18 AM

## 2019-04-24 ENCOUNTER — Encounter: Payer: Self-pay | Admitting: Obstetrics and Gynecology

## 2019-04-27 LAB — ANTI MULLERIAN HORMONE: ANTI-MULLERIAN HORMONE (AMH): 0.767 ng/mL

## 2019-04-29 ENCOUNTER — Telehealth: Payer: Self-pay | Admitting: Obstetrics and Gynecology

## 2019-04-29 ENCOUNTER — Other Ambulatory Visit: Payer: Self-pay

## 2019-04-29 ENCOUNTER — Encounter: Payer: Self-pay | Admitting: Family Medicine

## 2019-04-29 MED ORDER — CLOBETASOL PROPIONATE 0.05 % EX SHAM: MEDICATED_SHAMPOO | CUTANEOUS | 3 refills | Status: DC

## 2019-04-29 NOTE — Telephone Encounter (Signed)
Discussed test results.  She has already scheduled an appointment at a sub-specialist. Will try to accomplish the pre-requisite tests in the mean time. She was encouraged to continue to attempt conception.

## 2019-05-07 ENCOUNTER — Ambulatory Visit: Payer: 59

## 2019-05-07 ENCOUNTER — Other Ambulatory Visit: Payer: 59

## 2019-05-07 ENCOUNTER — Other Ambulatory Visit: Payer: Self-pay

## 2019-05-07 DIAGNOSIS — R300 Dysuria: Secondary | ICD-10-CM

## 2019-05-07 DIAGNOSIS — N979 Female infertility, unspecified: Secondary | ICD-10-CM

## 2019-05-08 DIAGNOSIS — R300 Dysuria: Secondary | ICD-10-CM | POA: Diagnosis not present

## 2019-05-08 DIAGNOSIS — N979 Female infertility, unspecified: Secondary | ICD-10-CM | POA: Diagnosis not present

## 2019-05-08 LAB — PROGESTERONE: Progesterone: 8.2 ng/mL

## 2019-05-10 LAB — URINE CULTURE

## 2019-05-11 ENCOUNTER — Other Ambulatory Visit: Payer: Self-pay | Admitting: Obstetrics and Gynecology

## 2019-05-11 DIAGNOSIS — N3000 Acute cystitis without hematuria: Secondary | ICD-10-CM

## 2019-05-11 DIAGNOSIS — N979 Female infertility, unspecified: Secondary | ICD-10-CM

## 2019-05-11 MED ORDER — CLOMIPHENE CITRATE 50 MG PO TABS: 50.0000 mg | ORAL_TABLET | Freq: Every day | ORAL | 0 refills | Status: AC

## 2019-05-11 MED ORDER — FLUCONAZOLE 150 MG PO TABS: 150.0000 mg | ORAL_TABLET | Freq: Once | ORAL | 0 refills | Status: AC

## 2019-05-11 MED ORDER — CEPHALEXIN 500 MG PO CAPS: 500.0000 mg | ORAL_CAPSULE | Freq: Four times a day (QID) | ORAL | 2 refills | Status: DC

## 2019-05-15 ENCOUNTER — Other Ambulatory Visit: Payer: 59

## 2019-05-15 ENCOUNTER — Other Ambulatory Visit: Payer: Self-pay | Admitting: Obstetrics and Gynecology

## 2019-05-15 ENCOUNTER — Other Ambulatory Visit: Payer: Self-pay

## 2019-05-15 ENCOUNTER — Telehealth: Payer: Self-pay | Admitting: Obstetrics and Gynecology

## 2019-05-15 DIAGNOSIS — N979 Female infertility, unspecified: Secondary | ICD-10-CM | POA: Diagnosis not present

## 2019-05-15 NOTE — Telephone Encounter (Signed)
Called pt to adv that Dr Jean Rosenthal ordered an HSG for 4/1 at 1:30. They ask that she ar at 1:15 @ [facility]  No prep per scheduling

## 2019-05-16 LAB — MEASLES/MUMPS/RUBELLA IMMUNITY
MUMPS ABS, IGG: >300 AU/mL (ref >10.9)
RUBEOLA AB, IGG: 136 AU/mL (ref >16.4)
Rubella Antibodies, IGG: 7.19 index (ref >0.99)

## 2019-05-16 LAB — HIV ANTIBODY (ROUTINE TESTING W REFLEX): HIV Screen 4th Generation wRfx: NONREACTIVE

## 2019-05-16 LAB — HEPATITIS C ANTIBODY: Hep C Virus Ab: <0.1 s/co ratio (ref 0.0–0.9)

## 2019-05-16 LAB — HEPATITIS B SURFACE ANTIGEN: Hepatitis B Surface Ag: NEGATIVE

## 2019-05-16 LAB — ABO/RH: Rh Factor: POSITIVE

## 2019-05-16 LAB — FOLLICLE STIMULATING HORMONE: FSH: 7.6 m[IU]/mL

## 2019-05-16 LAB — ESTRADIOL: Estradiol: 69.8 pg/mL

## 2019-05-21 ENCOUNTER — Other Ambulatory Visit: Payer: Self-pay

## 2019-05-21 ENCOUNTER — Ambulatory Visit
Admission: RE | Admit: 2019-05-21 | Discharge: 2019-05-21 | Disposition: A | Discharge status: Home / Self Care | Payer: 59 | Source: Ambulatory Visit | Attending: Obstetrics and Gynecology | Admitting: Obstetrics and Gynecology

## 2019-05-21 DIAGNOSIS — R102 Pelvic and perineal pain: Secondary | ICD-10-CM

## 2019-05-21 DIAGNOSIS — N979 Female infertility, unspecified: Secondary | ICD-10-CM | POA: Insufficient documentation

## 2019-05-21 DIAGNOSIS — Z3141 Encounter for fertility testing: Secondary | ICD-10-CM | POA: Diagnosis not present

## 2019-05-21 MED ORDER — IOHEXOL 300 MG/ML  SOLN: 10.0000 mL | Freq: Once | INTRAMUSCULAR | Status: DC | PRN

## 2019-05-21 NOTE — Procedures (Signed)
Patient met in radiology. Discussed procedure in detail.  All questions answered. Consent signed.  Patient placed in lithotomy position. Perineum draped.  Speculum placed.  Cervix appears normal.  Catheter placed into uterine cavity. Approximately 12 mL Iohexol 300 dye injected under fluoroscopic visualization.  Patent bilateral fallopian tube seen.  Uterine outline appears normal.  Catheter removed.  Pt stable, tolerated procedure well.   Prentice Docker, MD, [facility], [facility] 05/21/2019 2:37 PM

## 2019-06-15 ENCOUNTER — Telehealth: Payer: Self-pay | Admitting: Obstetrics and Gynecology

## 2019-06-15 DIAGNOSIS — E2839 Other primary ovarian failure: Secondary | ICD-10-CM | POA: Diagnosis not present

## 2019-06-15 DIAGNOSIS — Z319 Encounter for procreative management, unspecified: Secondary | ICD-10-CM | POA: Diagnosis not present

## 2019-06-15 NOTE — Telephone Encounter (Signed)
I emailed them securely to patient.

## 2019-06-15 NOTE — Telephone Encounter (Signed)
Patient is calling about her husbands semon analysis results for Tanya Glenn 02/28/1987. They have appointment today with an infertility specialist today. Please advise

## 2019-06-15 NOTE — Telephone Encounter (Signed)
Results have not posted yet. I have Beverly checking on them. I'll let you know.

## 2019-06-15 NOTE — Telephone Encounter (Signed)
Did you get these sent to pt? Or do I need to email them to her?

## 2019-06-30 DIAGNOSIS — Z113 Encounter for screening for infections with a predominantly sexual mode of transmission: Secondary | ICD-10-CM | POA: Diagnosis not present

## 2019-06-30 DIAGNOSIS — Z3183 Encounter for assisted reproductive fertility procedure cycle: Secondary | ICD-10-CM | POA: Diagnosis not present

## 2019-07-03 DIAGNOSIS — Z319 Encounter for procreative management, unspecified: Secondary | ICD-10-CM | POA: Diagnosis not present

## 2019-07-03 DIAGNOSIS — E2839 Other primary ovarian failure: Secondary | ICD-10-CM | POA: Diagnosis not present

## 2019-07-03 DIAGNOSIS — Z3183 Encounter for assisted reproductive fertility procedure cycle: Secondary | ICD-10-CM | POA: Diagnosis not present

## 2019-07-03 DIAGNOSIS — Z113 Encounter for screening for infections with a predominantly sexual mode of transmission: Secondary | ICD-10-CM | POA: Diagnosis not present

## 2019-07-07 DIAGNOSIS — Z3183 Encounter for assisted reproductive fertility procedure cycle: Secondary | ICD-10-CM | POA: Diagnosis not present

## 2019-07-07 DIAGNOSIS — Z319 Encounter for procreative management, unspecified: Secondary | ICD-10-CM | POA: Diagnosis not present

## 2019-07-07 DIAGNOSIS — E2839 Other primary ovarian failure: Secondary | ICD-10-CM | POA: Diagnosis not present

## 2019-07-10 DIAGNOSIS — Z3183 Encounter for assisted reproductive fertility procedure cycle: Secondary | ICD-10-CM | POA: Diagnosis not present

## 2019-07-11 DIAGNOSIS — Z319 Encounter for procreative management, unspecified: Secondary | ICD-10-CM | POA: Diagnosis not present

## 2019-07-11 DIAGNOSIS — Z113 Encounter for screening for infections with a predominantly sexual mode of transmission: Secondary | ICD-10-CM | POA: Diagnosis not present

## 2019-07-11 DIAGNOSIS — E2839 Other primary ovarian failure: Secondary | ICD-10-CM | POA: Diagnosis not present

## 2019-07-11 DIAGNOSIS — Z3183 Encounter for assisted reproductive fertility procedure cycle: Secondary | ICD-10-CM | POA: Diagnosis not present

## 2019-07-13 DIAGNOSIS — Z319 Encounter for procreative management, unspecified: Secondary | ICD-10-CM | POA: Diagnosis not present

## 2019-07-13 DIAGNOSIS — Z3183 Encounter for assisted reproductive fertility procedure cycle: Secondary | ICD-10-CM | POA: Diagnosis not present

## 2019-07-13 DIAGNOSIS — E2839 Other primary ovarian failure: Secondary | ICD-10-CM | POA: Diagnosis not present

## 2019-07-15 DIAGNOSIS — N856 Intrauterine synechiae: Secondary | ICD-10-CM | POA: Diagnosis not present

## 2019-07-15 DIAGNOSIS — Z3141 Encounter for fertility testing: Secondary | ICD-10-CM | POA: Diagnosis not present

## 2019-07-15 DIAGNOSIS — Z3183 Encounter for assisted reproductive fertility procedure cycle: Secondary | ICD-10-CM | POA: Diagnosis not present

## 2019-07-21 DIAGNOSIS — G47411 Narcolepsy with cataplexy: Secondary | ICD-10-CM | POA: Diagnosis not present

## 2019-07-26 ENCOUNTER — Encounter: Payer: Self-pay | Admitting: Family Medicine

## 2019-07-27 ENCOUNTER — Other Ambulatory Visit: Payer: Self-pay | Admitting: Family Medicine

## 2019-07-27 MED ORDER — ALBUTEROL SULFATE HFA 108 (90 BASE) MCG/ACT IN AERS: 1.0000 | INHALATION_SPRAY | RESPIRATORY_TRACT | 2 refills | Status: DC | PRN

## 2019-07-27 NOTE — Telephone Encounter (Signed)
Pt states that PCP told her that she could follow up in a year so she scheduled for Sept. 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 States that medication is expired and needs it asap

## 2019-08-10 DIAGNOSIS — Z113 Encounter for screening for infections with a predominantly sexual mode of transmission: Secondary | ICD-10-CM | POA: Diagnosis not present

## 2019-08-10 DIAGNOSIS — Z3183 Encounter for assisted reproductive fertility procedure cycle: Secondary | ICD-10-CM | POA: Diagnosis not present

## 2019-08-10 DIAGNOSIS — Z319 Encounter for procreative management, unspecified: Secondary | ICD-10-CM | POA: Diagnosis not present

## 2019-08-10 DIAGNOSIS — E2839 Other primary ovarian failure: Secondary | ICD-10-CM | POA: Diagnosis not present

## 2019-08-12 ENCOUNTER — Other Ambulatory Visit
Admission: RE | Admit: 2019-08-12 | Discharge: 2019-08-12 | Disposition: A | Discharge status: Home / Self Care | Payer: 59 | Attending: Obstetrics and Gynecology | Admitting: Obstetrics and Gynecology

## 2019-08-12 ENCOUNTER — Other Ambulatory Visit: Payer: Self-pay

## 2019-08-12 DIAGNOSIS — Z319 Encounter for procreative management, unspecified: Secondary | ICD-10-CM | POA: Diagnosis not present

## 2019-08-14 LAB — PROGESTERONE: Progesterone: <0.1 ng/mL

## 2019-08-17 DIAGNOSIS — Z3141 Encounter for fertility testing: Secondary | ICD-10-CM | POA: Diagnosis not present

## 2019-08-17 DIAGNOSIS — N8500 Endometrial hyperplasia, unspecified: Secondary | ICD-10-CM | POA: Diagnosis not present

## 2019-08-31 DIAGNOSIS — Z319 Encounter for procreative management, unspecified: Secondary | ICD-10-CM | POA: Diagnosis not present

## 2019-08-31 DIAGNOSIS — Z113 Encounter for screening for infections with a predominantly sexual mode of transmission: Secondary | ICD-10-CM | POA: Diagnosis not present

## 2019-08-31 DIAGNOSIS — E2839 Other primary ovarian failure: Secondary | ICD-10-CM | POA: Diagnosis not present

## 2019-08-31 DIAGNOSIS — Z3183 Encounter for assisted reproductive fertility procedure cycle: Secondary | ICD-10-CM | POA: Diagnosis not present

## 2019-09-01 ENCOUNTER — Ambulatory Visit (INDEPENDENT_AMBULATORY_CARE_PROVIDER_SITE_OTHER): Payer: 59 | Admitting: Family Medicine

## 2019-09-01 ENCOUNTER — Encounter: Payer: Self-pay | Admitting: Family Medicine

## 2019-09-01 ENCOUNTER — Other Ambulatory Visit: Payer: Self-pay

## 2019-09-01 DIAGNOSIS — R42 Dizziness and giddiness: Secondary | ICD-10-CM | POA: Insufficient documentation

## 2019-09-01 NOTE — Progress Notes (Signed)
  Marikay Alar, MD Phone: [phone]  Tanya Glenn is a 34 y.o. female who presents today for same day visit.  Dizziness/right ear pain: Patient notes this started this past weekend.  Her right ear was painful on 2 occasions.  It lasted about an hour at a time.  Last occurred yesterday.  She does have some tinnitus in that ear.  No hearing loss.  She has felt a little off balance and a little lightheaded at times though no room spinning sensation.  She will be fine and then feel a wave of being off balance.  No fevers.  She occasionally has a little congestion that resolves after the morning.  She does note she is on high-dose estrogen doing IVF.  She does note she has done exercises for her vertigo that may have been somewhat beneficial yesterday.  Social History   Tobacco Use  Smoking Status Never Smoker  Smokeless Tobacco Never Used     ROS see history of present illness  Objective  Physical Exam Vitals:   09/01/19 0805  BP: 118/80  Pulse: 72  Temp: 98.2 F (36.8 C)  SpO2: 99%   Lying blood pressure 115/79 pulse 64 Sitting blood pressure 120/81 pulse 65 Standing blood pressure 123/85 pulse 67  BP Readings from Last 3 Encounters:  09/01/19 118/80  04/23/19 122/70  02/16/19 118/78   Wt Readings from Last 3 Encounters:  09/01/19 141 lb 9.6 oz (64.2 kg)  04/23/19 137 lb (62.1 kg)  02/16/19 132 lb (59.9 kg)    Physical Exam Constitutional:      General: She is not in acute distress.    Appearance: She is not diaphoretic.  HENT:     Right Ear: Tympanic membrane, ear canal and external ear normal.     Left Ear: Tympanic membrane, ear canal and external ear normal.  Cardiovascular:     Rate and Rhythm: Normal rate and regular rhythm.  Pulmonary:     Effort: Pulmonary effort is normal.  Skin:    General: Skin is warm and dry.  Neurological:     Mental Status: She is alert.      Assessment/Plan: Please see individual problem list.  Dizziness I suspect  that she has eustachian tube dysfunction.  Her symptoms are not classic for vertigo.  No signs of an ear infection.  Discussed adding Zyrtec to see if that would help decrease inflammation in her eustachian tubes.  She will monitor for 1 week.  She will also continue the exercises for vertigo in case those are beneficial.  If not improved in 1 week she will let us know.   No orders of the defined types were placed in this encounter.   No orders of the defined types were placed in this encounter.   This visit occurred during the SARS-CoV-2 public health emergency.  Safety protocols were in place, including screening questions prior to the visit, additional usage of staff PPE, and extensive cleaning of exam room while observing appropriate contact time as indicated for disinfecting solutions.    Marikay Alar, MD [facility]

## 2019-09-01 NOTE — Assessment & Plan Note (Signed)
I suspect that she has eustachian tube dysfunction.  Her symptoms are not classic for vertigo.  No signs of an ear infection.  Discussed adding Zyrtec to see if that would help decrease inflammation in her eustachian tubes.  She will monitor for 1 week.  She will also continue the exercises for vertigo in case those are beneficial.  If not improved in 1 week she will let us know.

## 2019-09-01 NOTE — Patient Instructions (Signed)
Nice to see you. Please start Zyrtec once daily to see if that will help with your ear issues. Please continue the exercises for vertigo to see if that is beneficial as well. If not improving over the next week please let us know

## 2019-09-08 ENCOUNTER — Other Ambulatory Visit: Payer: Self-pay | Admitting: Obstetrics and Gynecology

## 2019-09-11 DIAGNOSIS — Z3183 Encounter for assisted reproductive fertility procedure cycle: Secondary | ICD-10-CM | POA: Diagnosis not present

## 2019-09-14 DIAGNOSIS — Z3141 Encounter for fertility testing: Secondary | ICD-10-CM | POA: Diagnosis not present

## 2019-10-05 DIAGNOSIS — E2839 Other primary ovarian failure: Secondary | ICD-10-CM | POA: Diagnosis not present

## 2019-10-05 DIAGNOSIS — Z3183 Encounter for assisted reproductive fertility procedure cycle: Secondary | ICD-10-CM | POA: Diagnosis not present

## 2019-10-05 DIAGNOSIS — Z113 Encounter for screening for infections with a predominantly sexual mode of transmission: Secondary | ICD-10-CM | POA: Diagnosis not present

## 2019-10-05 DIAGNOSIS — Z319 Encounter for procreative management, unspecified: Secondary | ICD-10-CM | POA: Diagnosis not present

## 2019-10-09 ENCOUNTER — Encounter: Payer: 59 | Admitting: Obstetrics and Gynecology

## 2019-10-12 DIAGNOSIS — E288 Other ovarian dysfunction: Secondary | ICD-10-CM | POA: Diagnosis not present

## 2019-10-12 DIAGNOSIS — Z319 Encounter for procreative management, unspecified: Secondary | ICD-10-CM | POA: Diagnosis not present

## 2019-10-27 DIAGNOSIS — Z319 Encounter for procreative management, unspecified: Secondary | ICD-10-CM | POA: Diagnosis not present

## 2019-10-27 DIAGNOSIS — E2839 Other primary ovarian failure: Secondary | ICD-10-CM | POA: Diagnosis not present

## 2019-10-27 DIAGNOSIS — Z3183 Encounter for assisted reproductive fertility procedure cycle: Secondary | ICD-10-CM | POA: Diagnosis not present

## 2019-10-30 DIAGNOSIS — Z319 Encounter for procreative management, unspecified: Secondary | ICD-10-CM | POA: Diagnosis not present

## 2019-10-30 DIAGNOSIS — Z113 Encounter for screening for infections with a predominantly sexual mode of transmission: Secondary | ICD-10-CM | POA: Diagnosis not present

## 2019-10-30 DIAGNOSIS — E2839 Other primary ovarian failure: Secondary | ICD-10-CM | POA: Diagnosis not present

## 2019-10-30 DIAGNOSIS — Z3183 Encounter for assisted reproductive fertility procedure cycle: Secondary | ICD-10-CM | POA: Diagnosis not present

## 2019-11-02 ENCOUNTER — Encounter: Payer: Self-pay | Admitting: Family Medicine

## 2019-11-02 ENCOUNTER — Ambulatory Visit (INDEPENDENT_AMBULATORY_CARE_PROVIDER_SITE_OTHER): Payer: 59 | Admitting: Family Medicine

## 2019-11-02 ENCOUNTER — Other Ambulatory Visit: Payer: Self-pay

## 2019-11-02 DIAGNOSIS — J4520 Mild intermittent asthma, uncomplicated: Secondary | ICD-10-CM

## 2019-11-02 DIAGNOSIS — R42 Dizziness and giddiness: Secondary | ICD-10-CM | POA: Diagnosis not present

## 2019-11-02 DIAGNOSIS — F419 Anxiety disorder, unspecified: Secondary | ICD-10-CM | POA: Diagnosis not present

## 2019-11-02 MED ORDER — QVAR REDIHALER 40 MCG/ACT IN AERB: INHALATION_SPRAY | RESPIRATORY_TRACT | 2 refills | Status: DC

## 2019-11-02 NOTE — Assessment & Plan Note (Signed)
Likely related to eustachian tube dysfunction.  She will monitor for recurrence.

## 2019-11-02 NOTE — Progress Notes (Addendum)
Marikay Alar, MD Phone: [phone]  Tanya Glenn is a 34 y.o. female who presents today for f/u.  Asthma: Intermittently uses her Qvar.  She will do well for a couple of weeks and then not take it.  She not been exercising so she is not been having any asthma symptoms.  No cough, shortness of breath, or wheezing.  Anxiety: Patient does note some anxiety.  She is in the process of IVF and has implantation later this week.  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  No depression.  Notes she does not need anything for this currently notes if she did she would go back to seeing a therapist which she did last year.  Eustachian tube dysfunction: Patient notes she took Zyrtec for several days after her last visit and has not had any additional issues with this.  Social History   Tobacco Use  Smoking Status Never Smoker  Smokeless Tobacco Never Used     ROS see history of present illness  Objective  Physical Exam Vitals:   11/02/19 1547  BP: 123/80  Pulse: 71  Temp: 98.8 F (37.1 C)  SpO2: 99%    BP Readings from Last 3 Encounters:  11/02/19 123/80  09/01/19 118/80  04/23/19 122/70   Wt Readings from Last 3 Encounters:  11/02/19 141 lb 3.2 oz (64 kg)  09/01/19 141 lb 9.6 oz (64.2 kg)  04/23/19 137 lb (62.1 kg)    Physical Exam Constitutional:      General: She is not in acute distress.    Appearance: She is not diaphoretic.  Cardiovascular:     Rate and Rhythm: Normal rate and regular rhythm.     Heart sounds: Normal heart sounds.  Pulmonary:     Effort: Pulmonary effort is normal.     Breath sounds: Normal breath sounds.  Skin:    General: Skin is warm and dry.  Neurological:     Mental Status: She is alert.      Assessment/Plan: Please see individual problem list.  Asthma, mild intermittent Overall doing well.  No significant symptoms.  Discussed she could use the Qvar on an as-needed basis for when she increases her activity level  again.  Discussed the need to monitor her symptoms and if she developed increasing symptoms she could let us know.  Dizziness Likely related to eustachian tube dysfunction.  She will monitor for recurrence.  Anxiety Overall adequately controlled.  She will monitor and if needed she could go back to seeing a therapist.   Health Maintenance: She will get her flu vaccine through work.  No orders of the defined types were placed in this encounter.   Meds ordered this encounter  Medications  . beclomethasone (QVAR REDIHALER) 40 MCG/ACT inhaler    Sig: INHALE 2 PUFFS INTO THE LUNGS TWO TIMES DAILY    Dispense:  10.6 g    Refill:  2   Tanya Glenn was seen today for follow-up.  Diagnoses and all orders for this visit:  Mild intermittent asthma without complication  Dizziness  Anxiety  Other orders -     beclomethasone (QVAR REDIHALER) 40 MCG/ACT inhaler; INHALE 2 PUFFS INTO THE LUNGS TWO TIMES DAILY     This visit occurred during the SARS-CoV-2 public health emergency.  Safety protocols were in place, including screening questions prior to the visit, additional usage of staff PPE, and extensive cleaning of exam room while observing appropriate contact time as indicated for disinfecting solutions.    Marikay Alar,  MD [place]

## 2019-11-02 NOTE — Patient Instructions (Signed)
Nice to see you. Please let us know when you need a refill of your Qvar. If your anxiety gets worse please let us know as well.

## 2019-11-02 NOTE — Assessment & Plan Note (Signed)
Overall doing well.  No significant symptoms.  Discussed she could use the Qvar on an as-needed basis for when she increases her activity level again.  Discussed the need to monitor her symptoms and if she developed increasing symptoms she could let us know.

## 2019-11-02 NOTE — Addendum Note (Signed)
Addended by: Glori Luis on: 11/02/2019 05:11 PM   Modules accepted: Orders

## 2019-11-02 NOTE — Assessment & Plan Note (Signed)
Overall adequately controlled.  She will monitor and if needed she could go back to seeing a therapist.

## 2019-11-05 DIAGNOSIS — Z3183 Encounter for assisted reproductive fertility procedure cycle: Secondary | ICD-10-CM | POA: Diagnosis not present

## 2019-11-13 ENCOUNTER — Other Ambulatory Visit
Admission: RE | Admit: 2019-11-13 | Discharge: 2019-11-13 | Disposition: A | Discharge status: Home / Self Care | Payer: 59 | Source: Ambulatory Visit | Attending: Obstetrics and Gynecology | Admitting: Obstetrics and Gynecology

## 2019-11-13 DIAGNOSIS — Z3200 Encounter for pregnancy test, result unknown: Secondary | ICD-10-CM | POA: Diagnosis not present

## 2019-11-13 LAB — HCG, QUANTITATIVE, PREGNANCY: hCG, Beta Chain, Quant, S: 210 m[IU]/mL — ABNORMAL HIGH (ref <5)

## 2019-11-16 ENCOUNTER — Other Ambulatory Visit
Admission: RE | Admit: 2019-11-16 | Discharge: 2019-11-16 | Disposition: A | Discharge status: Home / Self Care | Payer: 59 | Attending: Obstetrics and Gynecology | Admitting: Obstetrics and Gynecology

## 2019-11-16 DIAGNOSIS — Z3201 Encounter for pregnancy test, result positive: Secondary | ICD-10-CM | POA: Insufficient documentation

## 2019-11-16 DIAGNOSIS — O009 Unspecified ectopic pregnancy without intrauterine pregnancy: Secondary | ICD-10-CM | POA: Diagnosis not present

## 2019-11-16 LAB — HCG, QUANTITATIVE, PREGNANCY: hCG, Beta Chain, Quant, S: 1103 m[IU]/mL — ABNORMAL HIGH (ref <5)

## 2019-11-30 DIAGNOSIS — Z3200 Encounter for pregnancy test, result unknown: Secondary | ICD-10-CM | POA: Diagnosis not present

## 2019-12-07 DIAGNOSIS — Z3200 Encounter for pregnancy test, result unknown: Secondary | ICD-10-CM | POA: Diagnosis not present

## 2019-12-18 DIAGNOSIS — O0900 Supervision of pregnancy with history of infertility, unspecified trimester: Secondary | ICD-10-CM | POA: Diagnosis not present

## 2020-01-04 ENCOUNTER — Other Ambulatory Visit: Payer: Self-pay

## 2020-01-04 ENCOUNTER — Encounter: Payer: Self-pay | Admitting: Obstetrics and Gynecology

## 2020-01-04 ENCOUNTER — Ambulatory Visit (INDEPENDENT_AMBULATORY_CARE_PROVIDER_SITE_OTHER): Payer: 59 | Admitting: Obstetrics and Gynecology

## 2020-01-04 ENCOUNTER — Other Ambulatory Visit (HOSPITAL_COMMUNITY)
Admission: RE | Admit: 2020-01-04 | Discharge: 2020-01-04 | Disposition: A | Discharge status: Home / Self Care | Payer: 59 | Source: Ambulatory Visit | Attending: Obstetrics and Gynecology | Admitting: Obstetrics and Gynecology

## 2020-01-04 VITALS — BP 118/74 | Wt 148.0 lb

## 2020-01-04 DIAGNOSIS — Z0283 Encounter for blood-alcohol and blood-drug test: Secondary | ICD-10-CM | POA: Diagnosis not present

## 2020-01-04 DIAGNOSIS — O09529 Supervision of elderly multigravida, unspecified trimester: Secondary | ICD-10-CM | POA: Insufficient documentation

## 2020-01-04 DIAGNOSIS — O09511 Supervision of elderly primigravida, first trimester: Secondary | ICD-10-CM

## 2020-01-04 DIAGNOSIS — Z113 Encounter for screening for infections with a predominantly sexual mode of transmission: Secondary | ICD-10-CM | POA: Insufficient documentation

## 2020-01-04 DIAGNOSIS — Z1379 Encounter for other screening for genetic and chromosomal anomalies: Secondary | ICD-10-CM | POA: Diagnosis not present

## 2020-01-04 DIAGNOSIS — O0990 Supervision of high risk pregnancy, unspecified, unspecified trimester: Secondary | ICD-10-CM | POA: Insufficient documentation

## 2020-01-04 DIAGNOSIS — Z3A11 11 weeks gestation of pregnancy: Secondary | ICD-10-CM | POA: Insufficient documentation

## 2020-01-04 DIAGNOSIS — O99519 Diseases of the respiratory system complicating pregnancy, unspecified trimester: Secondary | ICD-10-CM

## 2020-01-04 DIAGNOSIS — O09819 Supervision of pregnancy resulting from assisted reproductive technology, unspecified trimester: Secondary | ICD-10-CM | POA: Insufficient documentation

## 2020-01-04 DIAGNOSIS — G47411 Narcolepsy with cataplexy: Secondary | ICD-10-CM

## 2020-01-04 DIAGNOSIS — O09519 Supervision of elderly primigravida, unspecified trimester: Secondary | ICD-10-CM | POA: Insufficient documentation

## 2020-01-04 DIAGNOSIS — J45909 Unspecified asthma, uncomplicated: Secondary | ICD-10-CM | POA: Insufficient documentation

## 2020-01-04 NOTE — Progress Notes (Signed)
New Obstetric Patient H&P   Chief Complaint: "Desires prenatal care"  History of Present Illness: Patient is a 34 y.o. G1P0000 Not Hispanic or Latino female, She had ART with embryo transfer (day 5) on 11/05/2019. Based on this her EDD is Estimated Date of Delivery: 07/23/20 and her EGA is 1245w2d. Her last pap smear was 1 years ago and was no abnormalities.    She had a urine pregnancy test which was positive Since her LMP she claims she has experienced lots of GI symptoms.  She has constipation. She is eating a lot of fiber.  She is taking stool softener twice a day.  She is now having bowel movements twice per day. She is taking Preperation H with lidocaine to help and she is using Tucks pads.  She has significant pain in abdomen when she has to have a bowel movement.  The pain feels really low.  The trend is that the pain is getting worse.   She denies vaginal bleeding. Her past medical history is notable for narcolepsy with "really mild" catoplexy. She has weird tingling and weakness and feet.  This is her first pregnancy.  She has had no issues with catoplexy, though she had very weakness.  She is taking nothing for her narcolepsy.  She states that things are better than she thought.    Since her LMP, she admits to the use of tobacco products  no She claims she has gained 14 pounds since the start of her pregnancy.  There are cats in the home in the home no  She admits close contact with children on a regular basis  yes  She has had chicken pox in the past yes She has had Tuberculosis exposures, symptoms, or previously tested positive for TB   no Current or past history of domestic violence. no  Genetic Screening/Teratology Counseling: (Includes patient, baby's father, or anyone in either family with:)   1. [facility]  yes 2. Thalassemia ([place], [place], Mediterranean, or Asian background): MCV<80  no 3. Neural tube defect (meningomyelocele, spina bifida, anencephaly)   no 4. Congenital heart defect  no  5. Down syndrome  no 6. Tay-Sachs (Jewish, [place])  no 7. Canavan's Disease  no 8. Sickle cell disease or trait (African)  no  9. Hemophilia or other blood disorders  no  10. Muscular dystrophy  no  11. Cystic fibrosis  no  12. Huntington's Chorea  no  13. Mental retardation/autism  no 14. Other inherited genetic or chromosomal disorder: mom with an intermediate increase in FMR1 gene repeats.  15. Maternal metabolic disorder (DM, PKU, etc)  no 16. Patient or FOB with a child with a birth defect not listed above no  16a. Patient or FOB with a birth defect themselves no 17. Recurrent pregnancy loss, or stillbirth  no  18. Any medications since LMP other than prenatal vitamins (include vitamins, supplements, OTC meds, drugs, alcohol)  Estrogens, progesterones.  Stool softener, PNVs 19. Any other genetic/environmental exposure to discuss  no  Infection History:   1. Lives with someone with TB or TB exposed  no  2. Patient or partner has history of genital herpes  no 3. Rash or viral illness since LMP  no 4. History of STI (GC, CT, HPV, syphilis, HIV)  H/o chlamydia, distant 5. History of recent travel :  no  Other pertinent information:  no  Review of Systems:10 point review of systems negative unless otherwise noted in HPI  Past  Medical History:  Diagnosis Date  . Asthma   . Narcolepsy   . Symptomatic PVCs     Past Surgical History:  Procedure Laterality Date  . WISDOM TOOTH EXTRACTION      Gynecologic History: Patient's last menstrual period was 10/26/2019.  Obstetric History: G1P0000  Family History  Problem Relation Age of Onset  . Heart disease Maternal Grandmother   . Stroke Maternal Grandmother   . Hyperlipidemia Father    Social History   Socioeconomic History  . Marital status: Married    Spouse name: Not on file  . Number of children: Not on file  . Years of education: Not on file  . Highest education level:  Not on file  Occupational History  . Occupation: Pharmacist  Tobacco Use  . Smoking status: Never Smoker  . Smokeless tobacco: Never Used  Vaping Use  . Vaping Use: Never used  Substance and Sexual Activity  . Alcohol use: Yes    Comment: occasional drink on the weekend.  . Drug use: No  . Sexual activity: Yes    Birth control/protection: None  Other Topics Concern  . Not on file  Social History Narrative   Lives in [place].  Exercises - wt training - 4+ x /wk.     Social Determinants of Health   Financial Resource Strain:   . Difficulty of Paying Living Expenses: Not on file  Food Insecurity:   . Worried About Programme researcher, broadcasting/film/video in the Last Year: Not on file  . Ran Out of Food in the Last Year: Not on file  Transportation Needs:   . Lack of Transportation (Medical): Not on file  . Lack of Transportation (Non-Medical): Not on file  Physical Activity:   . Days of Exercise per Week: Not on file  . Minutes of Exercise per Session: Not on file  Stress:   . Feeling of Stress : Not on file  Social Connections:   . Frequency of Communication with Friends and Family: Not on file  . Frequency of Social Gatherings with Friends and Family: Not on file  . Attends Religious Services: Not on file  . Active Member of Clubs or Organizations: Not on file  . Attends Banker Meetings: Not on file  . Marital Status: Not on file  Intimate Partner Violence:   . Fear of Current or Ex-Partner: Not on file  . Emotionally Abused: Not on file  . Physically Abused: Not on file  . Sexually Abused: Not on file    Allergies  Allergen Reactions  . Sulfa Antibiotics Hives   Prior to Admission medications   Medication Sig Start Date End Date Taking? Authorizing Provider  albuterol (VENTOLIN HFA) 108 (90 Base) MCG/ACT inhaler Inhale 1 puff into the lungs every 4 (four) hours as needed for wheezing or shortness of breath. 07/27/19  Yes Glori Luis, MD  beclomethasone (QVAR  REDIHALER) 40 MCG/ACT inhaler INHALE 2 PUFFS INTO THE LUNGS TWO TIMES DAILY 11/02/19  Yes Glori Luis, MD  omega-3 acid ethyl esters (LOVAZA) 1 g capsule Take by mouth 2 (two) times daily.   Yes [provider]  B-D 3CC LUER-LOK SYR 18GX1-1/2 18G X 1-1/2" 3 ML MISC  06/16/19   [provider]    Physical Exam BP 118/74   Wt 148 lb (67.1 kg)   LMP 10/26/2019   BMI 23.89 kg/m   Physical Exam Constitutional:      General: She is not in acute distress.  Appearance: Normal appearance. She is well-developed.  Genitourinary:     Pelvic exam was performed with patient in the lithotomy position.     Vulva, urethra and bladder normal.     No inguinal adenopathy present in the right or left side. HENT:     Head: Normocephalic and atraumatic.  Eyes:     General: No scleral icterus.    Conjunctiva/sclera: Conjunctivae normal.  Neck:     Thyroid: No thyromegaly.  Cardiovascular:     Rate and Rhythm: Normal rate and regular rhythm.     Heart sounds: No murmur heard.  No friction rub. No gallop.   Pulmonary:     Effort: Pulmonary effort is normal. No respiratory distress.     Breath sounds: Normal breath sounds. No wheezing or rales.  Chest:     Breasts:        Right: No inverted nipple, mass, nipple discharge, skin change or tenderness.        Left: No inverted nipple, mass, nipple discharge, skin change or tenderness.  Abdominal:     General: Bowel sounds are normal. There is no distension.     Palpations: Abdomen is soft. There is no mass.     Tenderness: There is no abdominal tenderness. There is no guarding or rebound.  Musculoskeletal:        General: No swelling or tenderness. Normal range of motion.     Cervical back: Normal range of motion and neck supple.  Lymphadenopathy:     Cervical: No cervical adenopathy.     Lower Body: No right inguinal adenopathy. No left inguinal adenopathy.  Neurological:     General: No focal deficit present.     Mental  Status: She is alert and oriented to person, place, and time.     Cranial Nerves: No cranial nerve deficit.  Skin:    General: Skin is warm and dry.     Findings: No erythema or rash.  Psychiatric:        Mood and Affect: Mood normal.        Behavior: Behavior normal.        Judgment: Judgment normal.    Female Chaperone present during breast and/or pelvic exam.  BSUS performed confirming single, living IUP with CRL consistent with dates.   Assessment: 34 y.o. G1P0000 at [redacted]w[redacted]d presenting to initiate prenatal care  Plan: 1) Avoid alcoholic beverages. 2) Patient encouraged not to smoke.  3) Discontinue the use of all non-medicinal drugs and chemicals.  4) Take prenatal vitamins daily.  5) Nutrition, food safety (fish, cheese advisories, and high nitrite foods) and exercise discussed. 6) Hospital and practice style discussed with cross coverage system.  7) Genetic Screening, such as with 1st Trimester Screening, cell free fetal DNA, AFP testing, and Ultrasound, as well as with amniocentesis and CVS as appropriate, is discussed with patient. At the conclusion of today's visit patient requested genetic testing 8) Patient is asked about travel to areas at risk for the Zika virus, and counseled to avoid travel and exposure to mosquitoes or sexual partners who may have themselves been exposed to the virus. Testing is discussed, and will be ordered as appropriate.  9) Narcolepsy with mild cataplexy: She is currently not taking medication and believes she is doing well overall, better in fact that she thought she would be doing.  She does have episodes of mild weakness and tingling.  We discussed cesarean section as a mode of delivery given the risk of cataplexy during labor.  An extensive literature review was undertaken by me and it appears that while labor does appear to be safe for many women, some women will have cataplectic episodes which necessitate the need for cesarean delivery.  Dr. Dartha Lodge  does have some concerns about this and we will discuss this further as she moves along in her pregnancy.  We also discussed that if she needs to take her medication, the literature seems to indicate that pregnancy outcomes are similar with and without medication.  We discussed the attendant risks of pregnancy with narcolepsy-cataplexy.  Weight gain along with gestational diabetes is noted to be an increased risk in the literature.  We will continue to monitor closely.  Thomasene Mohair, MD 01/04/2020 1:43 PM

## 2020-01-05 LAB — RPR+RH+ABO+RUB AB+AB SCR+CB...
Antibody Screen: NEGATIVE
HIV Screen 4th Generation wRfx: NONREACTIVE
Hematocrit: 40.4 % (ref 34.0–46.6)
Hemoglobin: 13.6 g/dL (ref 11.1–15.9)
Hepatitis B Surface Ag: NEGATIVE
MCH: 31 pg (ref 26.6–33.0)
MCHC: 33.7 g/dL (ref 31.5–35.7)
MCV: 92 fL (ref 79–97)
Platelets: 199 10*3/uL (ref 150–450)
RBC: 4.39 x10E6/uL (ref 3.77–5.28)
RDW: 12.3 % (ref 11.7–15.4)
RPR Ser Ql: NONREACTIVE
Rh Factor: POSITIVE
Rubella Antibodies, IGG: 8.19 index (ref >0.99)
Varicella zoster IgG: 807 index (ref >165)
WBC: 7.5 10*3/uL (ref 3.4–10.8)

## 2020-01-06 LAB — CERVICOVAGINAL ANCILLARY ONLY
Chlamydia: NEGATIVE
Comment: NEGATIVE
Comment: NORMAL
Neisseria Gonorrhea: NEGATIVE

## 2020-01-06 LAB — URINE CULTURE: Organism ID, Bacteria: NO GROWTH

## 2020-01-07 LAB — URINE DRUG PANEL 7
Amphetamines, Urine: NEGATIVE ng/mL
Barbiturate Quant, Ur: NEGATIVE ng/mL
Benzodiazepine Quant, Ur: NEGATIVE ng/mL
Cannabinoid Quant, Ur: NEGATIVE ng/mL
Cocaine (Metab.): NEGATIVE ng/mL
Opiate Quant, Ur: NEGATIVE ng/mL
PCP Quant, Ur: NEGATIVE ng/mL

## 2020-01-08 ENCOUNTER — Encounter: Payer: Self-pay | Admitting: Obstetrics and Gynecology

## 2020-01-09 LAB — MATERNIT 21 PLUS CORE, BLOOD
Fetal Fraction: 7
Result (T21): NEGATIVE
Trisomy 13 (Patau syndrome): NEGATIVE
Trisomy 18 (Edwards syndrome): NEGATIVE
Trisomy 21 (Down syndrome): NEGATIVE

## 2020-01-12 ENCOUNTER — Ambulatory Visit (INDEPENDENT_AMBULATORY_CARE_PROVIDER_SITE_OTHER): Payer: 59 | Admitting: Obstetrics and Gynecology

## 2020-01-12 ENCOUNTER — Other Ambulatory Visit: Payer: Self-pay

## 2020-01-12 ENCOUNTER — Encounter: Payer: Self-pay | Admitting: Obstetrics and Gynecology

## 2020-01-12 VITALS — BP 122/70

## 2020-01-12 DIAGNOSIS — R109 Unspecified abdominal pain: Secondary | ICD-10-CM

## 2020-01-12 DIAGNOSIS — J45909 Unspecified asthma, uncomplicated: Secondary | ICD-10-CM

## 2020-01-12 DIAGNOSIS — O09819 Supervision of pregnancy resulting from assisted reproductive technology, unspecified trimester: Secondary | ICD-10-CM

## 2020-01-12 DIAGNOSIS — O0991 Supervision of high risk pregnancy, unspecified, first trimester: Secondary | ICD-10-CM

## 2020-01-12 DIAGNOSIS — O99519 Diseases of the respiratory system complicating pregnancy, unspecified trimester: Secondary | ICD-10-CM

## 2020-01-12 DIAGNOSIS — O09511 Supervision of elderly primigravida, first trimester: Secondary | ICD-10-CM

## 2020-01-12 DIAGNOSIS — O26891 Other specified pregnancy related conditions, first trimester: Secondary | ICD-10-CM

## 2020-01-12 DIAGNOSIS — Z3A12 12 weeks gestation of pregnancy: Secondary | ICD-10-CM

## 2020-01-12 DIAGNOSIS — G47411 Narcolepsy with cataplexy: Secondary | ICD-10-CM

## 2020-01-12 NOTE — Progress Notes (Signed)
Routine Prenatal Care Visit  Subjective  Tanya Glenn is a 34 y.o. G1P0000 at [redacted]w[redacted]d being seen today for ongoing prenatal care.  She is currently monitored for the following issues for this high-risk pregnancy and has Leg pain, right; Psoriasis of scalp; Anxiety; Asthma, mild intermittent; Palpitations; Hypokalemia; Narcolepsy; BPPV (benign paroxysmal positional vertigo); GERD (gastroesophageal reflux disease); Lipid screening; Pre-conception counseling; Asthma exacerbation; Dizziness; Supervision of high risk pregnancy, antepartum; Encounter for supervision of pregnancy resulting from assisted reproductive technology; Advanced maternal age, primigravida; and Asthma during pregnancy on their problem list.  ----------------------------------------------------------------------------------- Patient reports pain in lower abdomen on both sides for the past 24 hours. She did a mild upper abdominal workup and believes this is related to the pain.  She denies any associated symptms.  She is seeking reassurance today.     . Vag. Bleeding: None.  Movement: Absent. Leaking Fluid denies.  ----------------------------------------------------------------------------------- The following portions of the patient's history were reviewed and updated as appropriate: allergies, current medications, past family history, past medical history, past social history, past surgical history and problem list. Problem list updated.  Objective  Blood pressure 122/70, last menstrual period 10/26/2019. Pregravid weight 134 lb (60.8 kg) Total Weight Gain 14 lb (6.35 kg) Urinalysis: Urine Protein    Urine Glucose    Fetal Status: Fetal Heart Rate (bpm): 175   Movement: Absent     General:  Alert, oriented and cooperative. Patient is in no acute distress.  Skin: Skin is warm and dry. No rash noted.   Cardiovascular: Normal heart rate noted  Respiratory: Normal respiratory effort, no problems with respiration noted  Abdomen:  Soft, gravid, appropriate for gestational age.       Pelvic:  Cervical exam deferred        Extremities: Normal range of motion.     Mental Status: Normal mood and affect. Normal behavior. Normal judgment and thought content.   Assessment   34 y.o. G1P0000 at [redacted]w[redacted]d by  07/23/2020, by Embryo Transfer presenting for work-in prenatal visit  Plan   pregnancy Problems (from 01/04/20 to present)    Problem Noted Resolved   Supervision of high risk pregnancy, antepartum 01/04/2020 by Conard Novak, MD No   Overview Addendum 01/08/2020 12:33 PM by Conard Novak, MD    [facility]inic [facility] Prenatal Labs  Dating Day 5 embryo xfer Blood type: O/Positive/-- (11/15 1455)   Genetic Screen NIPS: Antibody:Negative (11/15 1455)  Anatomic [place]  Rubella: 8.19 (11/15 1455)  Varicella: immune  GTT Early:               Third trimester:  RPR: Non Reactive (11/15 1455)   Rhogam  HBsAg: Negative (11/15 1455)   TDaP vaccine                       Flu Shot: HIV: Non Reactive (11/15 1455)   Baby Food                                GBS:   Contraception  Pap: up to date, normal  CBB     CS/VBAC    Support Person Husband: Edwin Dada           Previous Version   Encounter for supervision of pregnancy resulting from assisted reproductive technology 01/04/2020 by Conard Novak, MD No   Overview Signed 01/08/2020 12:32 PM by Conard Novak, MD    [ ]   Echo at 22 weeks      Advanced maternal age, primigravida 01/04/2020 by Conard Novak, MD No   Overview Addendum 01/08/2020 12:32 PM by Conard Novak, MD    [ ]  NIPS testing pending, 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 - discontinued medication for pregnancy - ?c-section for delivery vs vaginal delivery.  Vaginal delivery  should be OK.  Will continue to discuss depending on patient concerns.           Preterm labor symptoms and general obstetric precautions including but not limited to vaginal bleeding, contractions, leaking of fluid and fetal movement were reviewed in detail with the patient. Please refer to After Visit Summary for other counseling recommendations.   - patient reassured.  OK to continue to workout.   Return if symptoms worsen or fail to improve.   Conard Novak, MD, [facility], [facility] 01/12/2020 3:02 PM

## 2020-01-18 ENCOUNTER — Other Ambulatory Visit: Payer: Self-pay

## 2020-01-18 ENCOUNTER — Encounter: Payer: Self-pay | Admitting: Obstetrics and Gynecology

## 2020-01-18 ENCOUNTER — Ambulatory Visit (INDEPENDENT_AMBULATORY_CARE_PROVIDER_SITE_OTHER): Payer: 59 | Admitting: Obstetrics and Gynecology

## 2020-01-18 ENCOUNTER — Other Ambulatory Visit: Payer: Self-pay | Admitting: Obstetrics and Gynecology

## 2020-01-18 VITALS — BP 126/74 | Wt 148.0 lb

## 2020-01-18 DIAGNOSIS — O99519 Diseases of the respiratory system complicating pregnancy, unspecified trimester: Secondary | ICD-10-CM

## 2020-01-18 DIAGNOSIS — G47411 Narcolepsy with cataplexy: Secondary | ICD-10-CM

## 2020-01-18 DIAGNOSIS — Z3A13 13 weeks gestation of pregnancy: Secondary | ICD-10-CM

## 2020-01-18 DIAGNOSIS — O09511 Supervision of elderly primigravida, first trimester: Secondary | ICD-10-CM

## 2020-01-18 DIAGNOSIS — O09819 Supervision of pregnancy resulting from assisted reproductive technology, unspecified trimester: Secondary | ICD-10-CM

## 2020-01-18 DIAGNOSIS — J45909 Unspecified asthma, uncomplicated: Secondary | ICD-10-CM

## 2020-01-18 DIAGNOSIS — O0991 Supervision of high risk pregnancy, unspecified, first trimester: Secondary | ICD-10-CM

## 2020-01-18 MED ORDER — ACCU-CHEK SOFTCLIX LANCETS MISC: 1.0000 | Freq: Four times a day (QID) | 12 refills | Status: DC

## 2020-01-18 MED ORDER — ACCU-CHEK NANO SMARTVIEW W/DEVICE KIT: 1.0000 | PACK | 0 refills | Status: DC

## 2020-01-18 MED ORDER — ACCU-CHEK SMARTVIEW VI STRP: ORAL_STRIP | 12 refills | Status: DC

## 2020-01-18 NOTE — Progress Notes (Signed)
Routine Prenatal Care Visit  Subjective  Tanya Glenn is a 34 y.o. G1P0000 at [redacted]w[redacted]d being seen today for ongoing prenatal care.  She is currently monitored for the following issues for this high-risk pregnancy and has Leg pain, right; Psoriasis of scalp; Anxiety; Asthma, mild intermittent; Palpitations; Hypokalemia; Narcolepsy; BPPV (benign paroxysmal positional vertigo); GERD (gastroesophageal reflux disease); Lipid screening; Pre-conception counseling; Asthma exacerbation; Dizziness; Supervision of high risk pregnancy, antepartum; Encounter for supervision of pregnancy resulting from assisted reproductive technology; Advanced maternal age, primigravida; and Asthma during pregnancy on their problem list.  ----------------------------------------------------------------------------------- Patient reports dyspareunia, lubricant burns. No vaginal irritative sx. No abnormal discharge, no bleeding.   Contractions: Not present. Vag. Bleeding: None.  Movement: Absent. Leaking Fluid denies.  ----------------------------------------------------------------------------------- The following portions of the patient's history were reviewed and updated as appropriate: allergies, current medications, past family history, past medical history, past social history, past surgical history and problem list. Problem list updated.  Objective  Blood pressure 126/74, weight 148 lb (67.1 kg), last menstrual period 10/26/2019. Pregravid weight 134 lb (60.8 kg) Total Weight Gain 14 lb (6.35 kg) Urinalysis: Urine Protein    Urine Glucose    Fetal Status: Fetal Heart Rate (bpm): 165   Movement: Absent     General:  Alert, oriented and cooperative. Patient is in no acute distress.  Skin: Skin is warm and dry. No rash noted.   Cardiovascular: Normal heart rate noted  Respiratory: Normal respiratory effort, no problems with respiration noted  Abdomen: Soft, gravid, appropriate for gestational age. Pain/Pressure: Absent      Pelvic:  Cervical exam deferred        Extremities: Normal range of motion.     Mental Status: Normal mood and affect. Normal behavior. Normal judgment and thought content.   Assessment   34 y.o. G1P0000 at [redacted]w[redacted]d by  07/23/2020, by Embryo Transfer presenting for routine prenatal visit  Plan   pregnancy Problems (from 01/04/20 to present)    Problem Noted Resolved   Supervision of high risk pregnancy, antepartum 01/04/2020 by Conard Novak, MD No   Overview Addendum 01/08/2020 12:33 PM by Conard Novak, MD    Clinic [facility] Prenatal Labs  Dating Day 5 embryo xfer Blood type: O/Positive/-- (11/15 1455)   Genetic Screen NIPS: Antibody:Negative (11/15 1455)  Anatomic [place]  Rubella: 8.19 (11/15 1455)  Varicella: immune  GTT Early:               Third trimester:  RPR: Non Reactive (11/15 1455)   Rhogam  HBsAg: Negative (11/15 1455)   TDaP vaccine                       Flu Shot: HIV: Non Reactive (11/15 1455)   Baby Food                                GBS:   Contraception  Pap: up to date, normal  CBB     CS/VBAC    Support Person Husband: Edwin Dada           Previous Version   Encounter for supervision of pregnancy resulting from assisted reproductive technology 01/04/2020 by Conard Novak, MD No   Overview Signed 01/08/2020 12:32 PM by Conard Novak, MD    [ ]  Echo at 22 weeks      Advanced maternal age, primigravida 01/04/2020 by 01/06/2020, MD No  Overview Addendum 01/08/2020 12:32 PM by Conard Novak, MD    [ ]  NIPS testing pending, 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 - discontinued medication for pregnancy - ?c-section for delivery vs vaginal delivery.  Vaginal delivery should be OK.  Will continue to discuss depending on  patient concerns.           Preterm labor symptoms and general obstetric precautions including but not limited to vaginal bleeding, contractions, leaking of fluid and fetal movement were reviewed in detail with the patient. Please refer to After Visit Summary for other counseling recommendations.   - no obvious vaginitis symptoms.  May consider Replens  - glucometer given history of narcolepsy with cataplexy which increases risk of GDM.   Return in about 2 weeks (around 02/01/2020) for Routine Prenatal Appointment 02/03/2020, OK to over/double book).   Jean Rosenthal, MD, [facility], [facility] 01/18/2020 9:22 AM

## 2020-02-04 ENCOUNTER — Ambulatory Visit (INDEPENDENT_AMBULATORY_CARE_PROVIDER_SITE_OTHER): Payer: 59 | Admitting: Obstetrics and Gynecology

## 2020-02-04 ENCOUNTER — Other Ambulatory Visit: Payer: Self-pay

## 2020-02-04 ENCOUNTER — Encounter: Payer: Self-pay | Admitting: Obstetrics and Gynecology

## 2020-02-04 VITALS — BP 122/74 | Wt 153.0 lb

## 2020-02-04 DIAGNOSIS — J45909 Unspecified asthma, uncomplicated: Secondary | ICD-10-CM

## 2020-02-04 DIAGNOSIS — G47411 Narcolepsy with cataplexy: Secondary | ICD-10-CM

## 2020-02-04 DIAGNOSIS — O09819 Supervision of pregnancy resulting from assisted reproductive technology, unspecified trimester: Secondary | ICD-10-CM

## 2020-02-04 DIAGNOSIS — O99519 Diseases of the respiratory system complicating pregnancy, unspecified trimester: Secondary | ICD-10-CM

## 2020-02-04 DIAGNOSIS — O0992 Supervision of high risk pregnancy, unspecified, second trimester: Secondary | ICD-10-CM

## 2020-02-04 DIAGNOSIS — Z3A15 15 weeks gestation of pregnancy: Secondary | ICD-10-CM

## 2020-02-04 DIAGNOSIS — O09512 Supervision of elderly primigravida, second trimester: Secondary | ICD-10-CM

## 2020-02-04 NOTE — Progress Notes (Signed)
Routine Prenatal Care Visit  Subjective  Tanya Glenn is a 34 y.o. G1P0000 at [redacted]w[redacted]d being seen today for ongoing prenatal care.  She is currently monitored for the following issues for this high-risk pregnancy and has Leg pain, right; Psoriasis of scalp; Anxiety; Asthma, mild intermittent; Palpitations; Hypokalemia; Narcolepsy; BPPV (benign paroxysmal positional vertigo); GERD (gastroesophageal reflux disease); Lipid screening; Pre-conception counseling; Asthma exacerbation; Dizziness; Supervision of high risk pregnancy, antepartum; Encounter for supervision of pregnancy resulting from assisted reproductive technology; Advanced maternal age, primigravida; and Asthma during pregnancy on their problem list.  ----------------------------------------------------------------------------------- Patient reports no complaints.   Contractions: Not present. Vag. Bleeding: None.  Movement: Absent. Leaking Fluid denies.  ----------------------------------------------------------------------------------- The following portions of the patient's history were reviewed and updated as appropriate: allergies, current medications, past family history, past medical history, past social history, past surgical history and problem list. Problem list updated.  Objective  Blood pressure 122/74, weight 153 lb (69.4 kg), last menstrual period 10/26/2019. Pregravid weight 134 lb (60.8 kg) Total Weight Gain 19 lb (8.618 kg) Urinalysis: Urine Protein    Urine Glucose    Fetal Status: Fetal Heart Rate (bpm): 155   Movement: Absent     General:  Alert, oriented and cooperative. Patient is in no acute distress.  Skin: Skin is warm and dry. No rash noted.   Cardiovascular: Normal heart rate noted  Respiratory: Normal respiratory effort, no problems with respiration noted  Abdomen: Soft, gravid, appropriate for gestational age. Pain/Pressure: Absent     Pelvic:  Cervical exam deferred        Extremities: Normal range of  motion.     Mental Status: Normal mood and affect. Normal behavior. Normal judgment and thought content.   Assessment   34 y.o. G1P0000 at [redacted]w[redacted]d by  07/23/2020, by Embryo Transfer presenting for routine prenatal visit  Plan   pregnancy Problems (from 01/04/20 to present)    Problem Noted Resolved   Supervision of high risk pregnancy, antepartum 01/04/2020 by Conard Novak, MD No   Overview Addendum 01/08/2020 12:33 PM by Conard Novak, MD    Clinic [facility] Prenatal Labs  Dating Day 5 embryo xfer Blood type: O/Positive/-- (11/15 1455)   Genetic Screen NIPS: Antibody:Negative (11/15 1455)  Anatomic [place]  Rubella: 8.19 (11/15 1455)  Varicella: immune  GTT Early:               Third trimester:  RPR: Non Reactive (11/15 1455)   Rhogam  HBsAg: Negative (11/15 1455)   TDaP vaccine                       Flu Shot: HIV: Non Reactive (11/15 1455)   Baby Food                                GBS:   Contraception  Pap: up to date, normal  CBB     CS/VBAC    Support Person Husband: Edwin Dada         Previous Version   Encounter for supervision of pregnancy resulting from assisted reproductive technology 01/04/2020 by Conard Novak, MD No   Overview Signed 01/08/2020 12:32 PM by Conard Novak, MD    [ ]  Echo at 22 weeks      Advanced maternal age, primigravida 01/04/2020 by 01/06/2020, MD No   Overview Addendum 01/08/2020 12:32 PM by 01/10/2020, MD    [ ]   NIPS testing pending, Euploid on PGS      Previous Version   Asthma during pregnancy 01/04/2020 by Conard Novak, MD No   Narcolepsy 12/21/2016 by Glori Luis, MD No   Overview Signed 01/08/2020 12:32 PM by Conard Novak, MD    - with mild cataplexy. - discontinued medication for pregnancy - ?c-section for delivery vs vaginal delivery.  Vaginal delivery should be OK.  Will continue to discuss depending on patient concerns.           Preterm labor symptoms and general obstetric  precautions including but not limited to vaginal bleeding, contractions, leaking of fluid and fetal movement were reviewed in detail with the patient. Please refer to After Visit Summary for other counseling recommendations.   - fetal ECHO ordered today  Return in about 4 weeks (around 03/03/2020) for anatomy u/s and routine prenatal with Dr. Jean Rosenthal.   Thomasene Mohair, MD, [facility], [facility] 02/04/2020 5:51 PM

## 2020-02-20 NOTE — L&D Delivery Note (Signed)
Delivery Note At 11:33 AM a non-viable female was delivered via Vaginal, Spontaneous (Presentation:      ).  APGAR: 0, 0; weight 2 lb 2.9 oz (990 g).   Placenta status: Spontaneous, Intact.  Cord: 3 vessels with the following complications: Knot.  Cord pH: n/a  Anesthesia: Epidural Episiotomy: None Lacerations: None Suture Repair: n/a Est. Blood Loss (mL):  250, QBL pending  Mom to postpartum.  Baby to [place].  Called to see patient.  Mom pushed to deliver a non-viable female infant.  The head followed by shoulders, which delivered without difficulty, and the rest of the body.  A single nuchal cord and a true knot noted.  Baby to warmer for evaluation in accordance with mom's wishes.  Cord clamped and cut.  No cord blood obtained.  Placenta delivered spontaneously, intact, with a 3-vessel cord.  Small abrasions noted, otherwise, her perineum was intact without lacerations.  All counts correct.  Hemostasis obtained with IV pitocin and fundal massage. Misoprostol 800 mcg placed rectally to ensure ongoing hemostasis.  EBL 250 mL.     Thomasene Mohair, MD 05/04/2020, 12:49 PM

## 2020-02-29 ENCOUNTER — Encounter: Payer: Self-pay | Admitting: Obstetrics and Gynecology

## 2020-02-29 ENCOUNTER — Other Ambulatory Visit: Payer: Self-pay

## 2020-02-29 ENCOUNTER — Ambulatory Visit (INDEPENDENT_AMBULATORY_CARE_PROVIDER_SITE_OTHER): Payer: 59

## 2020-02-29 ENCOUNTER — Ambulatory Visit (INDEPENDENT_AMBULATORY_CARE_PROVIDER_SITE_OTHER): Payer: 59 | Admitting: Obstetrics and Gynecology

## 2020-02-29 VITALS — BP 120/76 | Wt 159.0 lb

## 2020-02-29 DIAGNOSIS — Z3A18 18 weeks gestation of pregnancy: Secondary | ICD-10-CM | POA: Diagnosis not present

## 2020-02-29 DIAGNOSIS — G47411 Narcolepsy with cataplexy: Secondary | ICD-10-CM

## 2020-02-29 DIAGNOSIS — O0992 Supervision of high risk pregnancy, unspecified, second trimester: Secondary | ICD-10-CM | POA: Diagnosis not present

## 2020-02-29 DIAGNOSIS — O99519 Diseases of the respiratory system complicating pregnancy, unspecified trimester: Secondary | ICD-10-CM

## 2020-02-29 DIAGNOSIS — O09512 Supervision of elderly primigravida, second trimester: Secondary | ICD-10-CM

## 2020-02-29 DIAGNOSIS — O09819 Supervision of pregnancy resulting from assisted reproductive technology, unspecified trimester: Secondary | ICD-10-CM

## 2020-02-29 DIAGNOSIS — Z3A19 19 weeks gestation of pregnancy: Secondary | ICD-10-CM

## 2020-02-29 DIAGNOSIS — J45909 Unspecified asthma, uncomplicated: Secondary | ICD-10-CM

## 2020-02-29 NOTE — Progress Notes (Signed)
Routine Prenatal Care Visit  Subjective  Tanya Glenn is a 35 y.o. G1P0000 at [redacted]w[redacted]d being seen today for ongoing prenatal care.  She is currently monitored for the following issues for this high-risk pregnancy and has Leg pain, right; Psoriasis of scalp; Anxiety; Asthma, mild intermittent; Palpitations; Hypokalemia; Narcolepsy; BPPV (benign paroxysmal positional vertigo); GERD (gastroesophageal reflux disease); Lipid screening; Pre-conception counseling; Asthma exacerbation; Dizziness; Supervision of high risk pregnancy, antepartum; Encounter for supervision of pregnancy resulting from assisted reproductive technology; Advanced maternal age, primigravida; and Asthma during pregnancy on their problem list.  ----------------------------------------------------------------------------------- Patient reports no complaints.   Contractions: Not present. Vag. Bleeding: None.  Movement: Absent. Leaking Fluid denies.  Anatomy u/s complete today.  ----------------------------------------------------------------------------------- The following portions of the patient's history were reviewed and updated as appropriate: allergies, current medications, past family history, past medical history, past social history, past surgical history and problem list. Problem list updated.  Objective  Blood pressure 120/76, weight 159 lb (72.1 kg), last menstrual period 10/26/2019. Pregravid weight 134 lb (60.8 kg) Total Weight Gain 25 lb (11.3 kg) Urinalysis: Urine Protein    Urine Glucose    Fetal Status: Fetal Heart Rate (bpm): 148 ([place])   Movement: Absent     General:  Alert, oriented and cooperative. Patient is in no acute distress.  Skin: Skin is warm and dry. No rash noted.   Cardiovascular: Normal heart rate noted  Respiratory: Normal respiratory effort, no problems with respiration noted  Abdomen: Soft, gravid, appropriate for gestational age. Pain/Pressure: Absent     Pelvic:  Cervical exam deferred         Extremities: Normal range of motion.     Mental Status: Normal mood and affect. Normal behavior. Normal judgment and thought content.   Imaging Results US OB Comp + 14 Wk  Result Date: 02/29/2020 Patient Name: Tanya Glenn DOB: Aug 24, 1985 MRN: [id number] ULTRASOUND REPORT Location: [facility] Date of Service: 02/29/2020 Indications:Anatomy Ultrasound Findings: Mason Jim intrauterine pregnancy is visualized with FHR at 148 BPM. Biometrics give an (U/S) Gestational age of [redacted]w[redacted]d and an (U/S) EDD of 07/26/2020; this correlates with the clinically established Estimated Date of Delivery: 07/23/2020. Fetal presentation is Breech. EFW: 277 g (10 oz). Placenta: fundal. Grade: 1 AFI: subjectively normal. Anatomic survey is complete and normal; Gender - female.  Impression: 1. [redacted]w[redacted]d Viable Singleton Intrauterine pregnancy by U/S. 2. (U/S) EDD is consistent with Clinically established Estimated Date of Delivery: 07/23/20, [redacted]w[redacted]d . 3. Normal Anatomy Scan Deanna Artis, RT There is a singleton gestation with subjectively normal amniotic fluid volume. The fetal biometry correlates with established dating. Detailed evaluation of the fetal anatomy was performed.The fetal anatomical survey appears within normal limits within the resolution of ultrasound as described above.  It must be noted that a normal ultrasound is unable to rule out fetal aneuploidy nor is it able to detect all possible malformations.   The ultrasound images and findings were reviewed by me and I agree with the above report. Thomasene Mohair, MD, [facility], [facility] 02/29/2020 4:14 PM       Assessment   34 y.o. G1P0000 at [redacted]w[redacted]d by  07/23/2020, by Embryo Transfer presenting for routine prenatal visit  Plan   pregnancy Problems (from 01/04/20 to present)    Problem Noted Resolved   Supervision of high risk pregnancy, antepartum 01/04/2020 by Conard Novak, MD No   Overview Addendum 01/08/2020 12:33 PM by Conard Novak, MD    Clinic [facility] Prenatal Labs  Dating Day 5 embryo xfer Blood type: O/Positive/-- (11/15 1455)   Genetic Screen NIPS: Antibody:Negative (11/15 1455)  Anatomic [place]  Rubella: 8.19 (11/15 1455)  Varicella: immune  GTT Early:               Third trimester:  RPR: Non Reactive (11/15 1455)   Rhogam  HBsAg: Negative (11/15 1455)   TDaP vaccine                       Flu Shot: HIV: Non Reactive (11/15 1455)   Baby Food                                GBS:   Contraception  Pap: up to date, normal  CBB     CS/VBAC    Support Person Husband: Edwin Dada           Previous Version   Encounter for supervision of pregnancy resulting from assisted reproductive technology 01/04/2020 by Conard Novak, MD No   Overview Signed 01/08/2020 12:32 PM by Conard Novak, MD    [ ]  Echo at 22 weeks      Advanced maternal age, primigravida 01/04/2020 by 01/06/2020, MD No   Overview Addendum 01/08/2020 12:32 PM by 01/10/2020, MD    [ ]  NIPS testing pending, Euploid on PGS      Previous Version   Asthma during pregnancy 01/04/2020 by , MD No   Narcolepsy 12/21/2016 by Conard Novak, MD No   Overview Signed 01/08/2020 12:32 PM by Glori Luis, MD    - with mild cataplexy. - discontinued medication for pregnancy - ?c-section for delivery vs vaginal delivery.  Vaginal delivery should be OK.  Will continue to discuss depending on patient concerns.           Preterm labor symptoms and general obstetric precautions including but not limited to vaginal bleeding, contractions, leaking of fluid and fetal movement were reviewed in detail with the patient. Please refer to After Visit Summary for other counseling recommendations.   - fetal echo scheduled 03/14/20  Return in about 4 weeks (around 03/28/2020) for Routine Prenatal Appointment.   03/16/20, MD, 05/26/2020 OB/GYN, [facility] 02/29/2020 4:27 PM

## 2020-03-09 ENCOUNTER — Other Ambulatory Visit: Payer: Self-pay

## 2020-03-09 ENCOUNTER — Ambulatory Visit (INDEPENDENT_AMBULATORY_CARE_PROVIDER_SITE_OTHER): Payer: 59

## 2020-03-09 ENCOUNTER — Other Ambulatory Visit: Payer: Self-pay | Admitting: Obstetrics and Gynecology

## 2020-03-09 DIAGNOSIS — R3915 Urgency of urination: Secondary | ICD-10-CM | POA: Diagnosis not present

## 2020-03-09 DIAGNOSIS — R300 Dysuria: Secondary | ICD-10-CM

## 2020-03-09 DIAGNOSIS — R350 Frequency of micturition: Secondary | ICD-10-CM | POA: Diagnosis not present

## 2020-03-09 LAB — POCT URINALYSIS DIPSTICK
Appearance: NORMAL
Bilirubin, UA: NEGATIVE
Blood, UA: NEGATIVE
Glucose, UA: NEGATIVE
Ketones, UA: NEGATIVE
Leukocytes, UA: NEGATIVE
Nitrite, UA: NEGATIVE
Odor: NORMAL
Protein, UA: NEGATIVE
Spec Grav, UA: 1.01 (ref 1.010–1.025)
Urobilinogen, UA: 0.2 E.U./dL
pH, UA: 6 (ref 5.0–8.0)

## 2020-03-09 NOTE — Progress Notes (Signed)
Patient presents today with complaints of increased urge and frequency of urination. Urinalysis performed Negative). Results recorded. Culture drawn.

## 2020-03-10 ENCOUNTER — Other Ambulatory Visit: Payer: Self-pay

## 2020-03-10 DIAGNOSIS — R300 Dysuria: Secondary | ICD-10-CM

## 2020-03-12 LAB — URINE CULTURE: Organism ID, Bacteria: NO GROWTH

## 2020-03-17 DIAGNOSIS — Z3183 Encounter for assisted reproductive fertility procedure cycle: Secondary | ICD-10-CM | POA: Diagnosis not present

## 2020-03-17 DIAGNOSIS — O358XX1 Maternal care for other (suspected) fetal abnormality and damage, fetus 1: Secondary | ICD-10-CM | POA: Diagnosis not present

## 2020-04-01 ENCOUNTER — Ambulatory Visit (INDEPENDENT_AMBULATORY_CARE_PROVIDER_SITE_OTHER): Payer: 59 | Admitting: Obstetrics and Gynecology

## 2020-04-01 ENCOUNTER — Other Ambulatory Visit: Payer: Self-pay

## 2020-04-01 ENCOUNTER — Encounter: Payer: Self-pay | Admitting: Obstetrics and Gynecology

## 2020-04-01 VITALS — BP 126/74 | Wt 167.0 lb

## 2020-04-01 DIAGNOSIS — O09512 Supervision of elderly primigravida, second trimester: Secondary | ICD-10-CM

## 2020-04-01 DIAGNOSIS — Z3A23 23 weeks gestation of pregnancy: Secondary | ICD-10-CM

## 2020-04-01 DIAGNOSIS — O0990 Supervision of high risk pregnancy, unspecified, unspecified trimester: Secondary | ICD-10-CM

## 2020-04-01 DIAGNOSIS — Z113 Encounter for screening for infections with a predominantly sexual mode of transmission: Secondary | ICD-10-CM

## 2020-04-01 DIAGNOSIS — Z131 Encounter for screening for diabetes mellitus: Secondary | ICD-10-CM

## 2020-04-01 DIAGNOSIS — O09819 Supervision of pregnancy resulting from assisted reproductive technology, unspecified trimester: Secondary | ICD-10-CM

## 2020-04-01 DIAGNOSIS — G47411 Narcolepsy with cataplexy: Secondary | ICD-10-CM

## 2020-04-01 DIAGNOSIS — O99519 Diseases of the respiratory system complicating pregnancy, unspecified trimester: Secondary | ICD-10-CM

## 2020-04-01 DIAGNOSIS — J45909 Unspecified asthma, uncomplicated: Secondary | ICD-10-CM

## 2020-04-01 NOTE — Progress Notes (Signed)
Routine Prenatal Care Visit  Subjective  Tanya Glenn is a 35 y.o. G1P0000 at [redacted]w[redacted]d being seen today for ongoing prenatal care.  She is currently monitored for the following issues for this high-risk pregnancy and has Leg pain, right; Psoriasis of scalp; Anxiety; Asthma, mild intermittent; Palpitations; Hypokalemia; Narcolepsy; BPPV (benign paroxysmal positional vertigo); GERD (gastroesophageal reflux disease); Lipid screening; Pre-conception counseling; Asthma exacerbation; Dizziness; Supervision of high risk pregnancy, antepartum; Encounter for supervision of pregnancy resulting from assisted reproductive technology; Advanced maternal age, primigravida; and Asthma during pregnancy on their problem list.  ----------------------------------------------------------------------------------- Patient reports no complaints.   Contractions: Not present. Vag. Bleeding: None.  Movement: Present. Leaking Fluid denies.  ----------------------------------------------------------------------------------- The following portions of the patient's history were reviewed and updated as appropriate: allergies, current medications, past family history, past medical history, past social history, past surgical history and problem list. Problem list updated.  Objective  Blood pressure 126/74, weight 167 lb (75.8 kg), last menstrual period 10/26/2019. Pregravid weight 134 lb (60.8 kg) Total Weight Gain 33 lb (15 kg) Urinalysis: Urine Protein    Urine Glucose    Fetal Status: Fetal Heart Rate (bpm): 140 Fundal Height: 24 cm Movement: Present     General:  Alert, oriented and cooperative. Patient is in no acute distress.  Skin: Skin is warm and dry. No rash noted.   Cardiovascular: Normal heart rate noted  Respiratory: Normal respiratory effort, no problems with respiration noted  Abdomen: Soft, gravid, appropriate for gestational age. Pain/Pressure: Absent     Pelvic:  Cervical exam deferred        Extremities:  Normal range of motion.     Mental Status: Normal mood and affect. Normal behavior. Normal judgment and thought content.   Assessment   35 y.o. G1P0000 at [redacted]w[redacted]d by  07/23/2020, by Embryo Transfer presenting for routine prenatal visit  Plan   pregnancy Problems (from 01/04/20 to present)    Problem Noted Resolved   Supervision of high risk pregnancy, antepartum 01/04/2020 by Conard Novak, MD No   Overview Addendum 01/08/2020 12:33 PM by Conard Novak, MD    Clinic [facility] Prenatal Labs  Dating Day 5 embryo xfer Blood type: O/Positive/-- (11/15 1455)   Genetic Screen NIPS: Antibody:Negative (11/15 1455)  Anatomic [place]  Rubella: 8.19 (11/15 1455)  Varicella: immune  GTT Early:               Third trimester:  RPR: Non Reactive (11/15 1455)   Rhogam  HBsAg: Negative (11/15 1455)   TDaP vaccine                       Flu Shot: HIV: Non Reactive (11/15 1455)   Baby Food                                GBS:   Contraception  Pap: up to date, normal  CBB     CS/VBAC    Support Person Husband: Edwin Dada           Previous Version   Encounter for supervision of pregnancy resulting from assisted reproductive technology 01/04/2020 by Conard Novak, MD No   Overview Addendum 04/01/2020  2:19 PM by Conard Novak, MD    [x]  Echo at 22 weeks - nml on 03/14/20      Previous Version   Advanced maternal age, primigravida 01/04/2020 by 01/06/2020, MD No  Overview Addendum 01/08/2020 12:32 PM by Conard Novak, MD    [ ]  NIPS testing pending, 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 - discontinued medication for pregnancy - ?c-section for delivery vs vaginal delivery.  Vaginal delivery should be OK.  Will continue to discuss depending on patient concerns.            Preterm labor symptoms and general obstetric precautions including but not limited to vaginal bleeding, contractions, leaking of fluid and fetal movement were reviewed in detail with the patient. Please refer to After Visit Summary for other counseling recommendations.   Return in about 4 weeks (around 04/29/2020) for 28 week labs and routine prenatal.   06/29/2020, MD, [facility]air [facility], [facility] 04/01/2020 2:19 PM

## 2020-04-12 ENCOUNTER — Other Ambulatory Visit: Payer: Self-pay | Admitting: Family Medicine

## 2020-04-29 ENCOUNTER — Other Ambulatory Visit: Payer: 59

## 2020-04-29 ENCOUNTER — Ambulatory Visit: Payer: 59 | Attending: Internal Medicine

## 2020-04-29 ENCOUNTER — Ambulatory Visit (INDEPENDENT_AMBULATORY_CARE_PROVIDER_SITE_OTHER): Payer: 59 | Admitting: Obstetrics and Gynecology

## 2020-04-29 ENCOUNTER — Other Ambulatory Visit: Payer: Self-pay | Admitting: Internal Medicine

## 2020-04-29 ENCOUNTER — Encounter: Payer: Self-pay | Admitting: Obstetrics and Gynecology

## 2020-04-29 ENCOUNTER — Other Ambulatory Visit: Payer: Self-pay

## 2020-04-29 VITALS — BP 122/74 | Wt 172.0 lb

## 2020-04-29 DIAGNOSIS — O99519 Diseases of the respiratory system complicating pregnancy, unspecified trimester: Secondary | ICD-10-CM

## 2020-04-29 DIAGNOSIS — G47411 Narcolepsy with cataplexy: Secondary | ICD-10-CM | POA: Diagnosis not present

## 2020-04-29 DIAGNOSIS — J45909 Unspecified asthma, uncomplicated: Secondary | ICD-10-CM

## 2020-04-29 DIAGNOSIS — Z23 Encounter for immunization: Secondary | ICD-10-CM

## 2020-04-29 DIAGNOSIS — O0993 Supervision of high risk pregnancy, unspecified, third trimester: Secondary | ICD-10-CM

## 2020-04-29 DIAGNOSIS — Z131 Encounter for screening for diabetes mellitus: Secondary | ICD-10-CM

## 2020-04-29 DIAGNOSIS — Z113 Encounter for screening for infections with a predominantly sexual mode of transmission: Secondary | ICD-10-CM | POA: Diagnosis not present

## 2020-04-29 DIAGNOSIS — O09512 Supervision of elderly primigravida, second trimester: Secondary | ICD-10-CM | POA: Diagnosis not present

## 2020-04-29 DIAGNOSIS — Z3A27 27 weeks gestation of pregnancy: Secondary | ICD-10-CM

## 2020-04-29 DIAGNOSIS — O09819 Supervision of pregnancy resulting from assisted reproductive technology, unspecified trimester: Secondary | ICD-10-CM

## 2020-04-29 DIAGNOSIS — O0990 Supervision of high risk pregnancy, unspecified, unspecified trimester: Secondary | ICD-10-CM

## 2020-04-29 DIAGNOSIS — O09513 Supervision of elderly primigravida, third trimester: Secondary | ICD-10-CM

## 2020-04-29 MED ORDER — BREAST PUMP MISC: 0 refills | Status: DC

## 2020-04-29 NOTE — Progress Notes (Signed)
Routine Prenatal Care Visit  Subjective  Tanya Glenn is a 35 y.o. G1P0000 at [redacted]w[redacted]d being seen today for ongoing prenatal care.  She is currently monitored for the following issues for this high-risk pregnancy and has Leg pain, right; Psoriasis of scalp; Anxiety; Asthma, mild intermittent; Palpitations; Hypokalemia; Narcolepsy; BPPV (benign paroxysmal positional vertigo); GERD (gastroesophageal reflux disease); Lipid screening; Pre-conception counseling; Asthma exacerbation; Dizziness; Supervision of high risk pregnancy, antepartum; Encounter for supervision of pregnancy resulting from assisted reproductive technology; Advanced maternal age, primigravida; and Asthma during pregnancy on their problem list.  ----------------------------------------------------------------------------------- Patient reports no complaints.   Contractions: Not present. Vag. Bleeding: None.  Movement: Present. Leaking Fluid denies.  ----------------------------------------------------------------------------------- The following portions of the patient's history were reviewed and updated as appropriate: allergies, current medications, past family history, past medical history, past social history, past surgical history and problem list. Problem list updated.  Objective  Blood pressure 122/74, weight 172 lb (78 kg), last menstrual period 10/26/2019. Pregravid weight 134 lb (60.8 kg) Total Weight Gain 38 lb (17.2 kg) Urinalysis: Urine Protein    Urine Glucose    Fetal Status: Fetal Heart Rate (bpm): 150 Fundal Height: 27 cm Movement: Present  Presentation: Vertex  General:  Alert, oriented and cooperative. Patient is in no acute distress.  Skin: Skin is warm and dry. No rash noted.   Cardiovascular: Normal heart rate noted  Respiratory: Normal respiratory effort, no problems with respiration noted  Abdomen: Soft, gravid, appropriate for gestational age. Pain/Pressure: Absent     Pelvic:  Cervical exam deferred         Extremities: Normal range of motion.  Edema: None  Mental Status: Normal mood and affect. Normal behavior. Normal judgment and thought content.   Assessment   35 y.o. G1P0000 at [redacted]w[redacted]d by  07/23/2020, by Embryo Transfer presenting for routine prenatal visit  Plan   pregnancy Problems (from 01/04/20 to present)    Problem Noted Resolved   Supervision of high risk pregnancy, antepartum 01/04/2020 by Conard Novak, MD No   Overview Addendum 01/08/2020 12:33 PM by Conard Novak, MD    Clinic [facility] Prenatal Labs  Dating Day 5 embryo xfer Blood type: O/Positive/-- (11/15 1455)   Genetic Screen NIPS: Antibody:Negative (11/15 1455)  Anatomic [place]  Rubella: 8.19 (11/15 1455)  Varicella: immune  GTT Early:               Third trimester:  RPR: Non Reactive (11/15 1455)   Rhogam  HBsAg: Negative (11/15 1455)   TDaP vaccine                       Flu Shot: HIV: Non Reactive (11/15 1455)   Baby Food                                GBS:   Contraception  Pap: up to date, normal  CBB     CS/VBAC    Support Person Husband: Edwin Dada           Previous Version   Encounter for supervision of pregnancy resulting from assisted reproductive technology 01/04/2020 by Conard Novak, MD No   Overview Addendum 04/01/2020  2:19 PM by Conard Novak, MD    [x]  Echo at 22 weeks - nml on 03/14/20      Previous Version   Advanced maternal age, primigravida 01/04/2020 by 01/06/2020, MD No  Overview Addendum 01/08/2020 12:32 PM by Conard Novak, MD    [ ]  NIPS testing pending, 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 - discontinued medication for pregnancy - ?c-section for delivery vs vaginal delivery.  Vaginal delivery should be OK.  Will continue to discuss  depending on patient concerns.           Preterm labor symptoms and general obstetric precautions including but not limited to vaginal bleeding, contractions, leaking of fluid and fetal movement were reviewed in detail with the patient. Please refer to After Visit Summary for other counseling recommendations.   Return in about 2 weeks (around 05/13/2020) for Routine Prenatal Appointment.   05/15/2020, MD, [facility], [facility] 04/29/2020 3:53 PM

## 2020-04-29 NOTE — Progress Notes (Signed)
   Covid-19 Vaccination Clinic  Name:  Yun Gutierrez    MRN: [id number] DOB: 1985/02/27  04/29/2020  Ms. Gregg was observed post Covid-19 immunization for 15 minutes without incident. She was provided with Vaccine Information Sheet and instruction to access the V-Safe system.   Ms. Vannostrand was instructed to call 911 with any severe reactions post vaccine: Marland Kitchen Difficulty breathing  . Swelling of face and throat  . A fast heartbeat  . A bad rash all over body  . Dizziness and weakness   Immunizations Administered    Name Date Dose VIS Date Route   PFIZER Comrnaty(Gray TOP) Covid-19 Vaccine 04/29/2020  1:27 PM 0.3 mL 01/28/2020 Intramuscular   Manufacturer: [employer], [employer]   Lot: [id number]   NDC: [phone]

## 2020-04-30 ENCOUNTER — Observation Stay
Admission: EM | Admit: 2020-04-30 | Discharge: 2020-04-30 | Disposition: A | Discharge status: Home / Self Care | Payer: 59 | Source: Home / Self Care | Admitting: Obstetrics and Gynecology

## 2020-04-30 ENCOUNTER — Other Ambulatory Visit: Payer: Self-pay

## 2020-04-30 DIAGNOSIS — O26893 Other specified pregnancy related conditions, third trimester: Secondary | ICD-10-CM | POA: Diagnosis present

## 2020-04-30 DIAGNOSIS — Z3A28 28 weeks gestation of pregnancy: Secondary | ICD-10-CM

## 2020-04-30 DIAGNOSIS — O09513 Supervision of elderly primigravida, third trimester: Secondary | ICD-10-CM

## 2020-04-30 DIAGNOSIS — R109 Unspecified abdominal pain: Secondary | ICD-10-CM | POA: Diagnosis present

## 2020-04-30 DIAGNOSIS — O9952 Diseases of the respiratory system complicating childbirth: Secondary | ICD-10-CM | POA: Diagnosis not present

## 2020-04-30 DIAGNOSIS — G47419 Narcolepsy without cataplexy: Secondary | ICD-10-CM | POA: Diagnosis not present

## 2020-04-30 DIAGNOSIS — J4520 Mild intermittent asthma, uncomplicated: Secondary | ICD-10-CM | POA: Diagnosis not present

## 2020-04-30 DIAGNOSIS — O4703 False labor before 37 completed weeks of gestation, third trimester: Secondary | ICD-10-CM | POA: Insufficient documentation

## 2020-04-30 DIAGNOSIS — O0001 Abdominal pregnancy with intrauterine pregnancy: Secondary | ICD-10-CM | POA: Insufficient documentation

## 2020-04-30 DIAGNOSIS — J45909 Unspecified asthma, uncomplicated: Secondary | ICD-10-CM | POA: Insufficient documentation

## 2020-04-30 DIAGNOSIS — Z20822 Contact with and (suspected) exposure to covid-19: Secondary | ICD-10-CM | POA: Diagnosis not present

## 2020-04-30 DIAGNOSIS — O0990 Supervision of high risk pregnancy, unspecified, unspecified trimester: Secondary | ICD-10-CM

## 2020-04-30 DIAGNOSIS — O368120 Decreased fetal movements, second trimester, not applicable or unspecified: Secondary | ICD-10-CM | POA: Diagnosis not present

## 2020-04-30 DIAGNOSIS — G47411 Narcolepsy with cataplexy: Secondary | ICD-10-CM

## 2020-04-30 DIAGNOSIS — O09819 Supervision of pregnancy resulting from assisted reproductive technology, unspecified trimester: Secondary | ICD-10-CM

## 2020-04-30 DIAGNOSIS — O99354 Diseases of the nervous system complicating childbirth: Secondary | ICD-10-CM | POA: Diagnosis not present

## 2020-04-30 DIAGNOSIS — O364XX0 Maternal care for intrauterine death, not applicable or unspecified: Secondary | ICD-10-CM | POA: Diagnosis not present

## 2020-04-30 LAB — 28 WEEK RH+PANEL
Basophils Absolute: 0 10*3/uL (ref 0.0–0.2)
Basos: 0 %
EOS (ABSOLUTE): 0.2 10*3/uL (ref 0.0–0.4)
Eos: 2 %
Gestational Diabetes Screen: 127 mg/dL (ref 65–139)
HIV Screen 4th Generation wRfx: NONREACTIVE
Hematocrit: 36.1 % (ref 34.0–46.6)
Hemoglobin: 12.2 g/dL (ref 11.1–15.9)
Immature Grans (Abs): 0 10*3/uL (ref 0.0–0.1)
Immature Granulocytes: 0 %
Lymphocytes Absolute: 1.5 10*3/uL (ref 0.7–3.1)
Lymphs: 13 %
MCH: 30.8 pg (ref 26.6–33.0)
MCHC: 33.8 g/dL (ref 31.5–35.7)
MCV: 91 fL (ref 79–97)
Monocytes Absolute: 0.4 10*3/uL (ref 0.1–0.9)
Monocytes: 4 %
Neutrophils Absolute: 8.9 10*3/uL — ABNORMAL HIGH (ref 1.4–7.0)
Neutrophils: 81 %
Platelets: 147 10*3/uL — ABNORMAL LOW (ref 150–450)
RBC: 3.96 x10E6/uL (ref 3.77–5.28)
RDW: 12.3 % (ref 11.7–15.4)
RPR Ser Ql: NONREACTIVE
WBC: 11 10*3/uL — ABNORMAL HIGH (ref 3.4–10.8)

## 2020-04-30 NOTE — OB Triage Note (Signed)
Pt arrives G1P0 with in IVF pregnancy. Pt denies any fluid leaking but has been recording her contractions and they have been anywhere from 10-50 minutes apart. Pt rates 5/10 when contracting.

## 2020-04-30 NOTE — Discharge Summary (Signed)
See final progress note. 

## 2020-04-30 NOTE — Final Progress Note (Signed)
Physician Final Progress Note  Patient ID: Tanya Glenn MRN: [id number] DOB/AGE: [birth year] 35 y.o.  Admit date: 04/30/2020 Admitting provider: Will Bonnet, MD Discharge date: 04/30/2020   Admission Diagnoses:  1) intrauterine pregnancy at [redacted]w[redacted]d 2) abdominal pain in pregnancy, third trimester  Discharge Diagnoses:  1) intrauterine pregnancy at 24w0d2) abdominal pain in pregnancy, third trimester  History of Present Illness: The patient is a 35o. female G1P0000 at 36w0do presents for irregular uterine contractions.  Her contractions started this morning. She had her COVID booster yesterday.  She notes the contractions have ranged from 15-50 minutes apart.  Some of her contractions were strong enough to change her breathing.  She notes +FM, no vaginal bleeding, no LOF.  She denies urinary symptoms. She has had some diarrhea.  She denies fevers and chills.    Past Medical History:  Diagnosis Date  . Asthma   . Asthma   . Narcolepsy   . Symptomatic PVCs     Past Surgical History:  Procedure Laterality Date  . WISDOM TOOTH EXTRACTION      No current facility-administered medications on file prior to encounter.   Current Outpatient Medications on File Prior to Encounter  Medication Sig Dispense Refill  . Accu-Chek Softclix Lancets lancets 1 each by Other route 4 (four) times daily. 100 each 12  . albuterol (VENTOLIN HFA) 108 (90 Base) MCG/ACT inhaler Inhale 1 puff into the lungs every 4 (four) hours as needed for wheezing or shortness of breath. 8 g 2  . B-D 3CC LUER-LOK SYR 18GX1-1/2 18G X 1-1/2" 3 ML MISC  (Patient not taking: Reported on 01/04/2020)    . Blood Glucose Monitoring Suppl (ACCU-CHEK NANO SMARTVIEW) w/Device KIT 1 kit by Subdermal route as directed. Check blood sugars for fasting, and two hours after breakfast, lunch and dinner (4 checks daily) 1 kit 0  . Clobetasol Propionate 0.05 % shampoo APPLY TO AFFECTED AREA(S) AS NEEDED. (Patient not taking:  Reported on 11/02/2019) 118 mL 3  . estradiol (ESTRACE) 2 MG tablet Take by mouth. (Patient not taking: Reported on 01/04/2020)    . glucose blood (ACCU-CHEK SMARTVIEW) test strip Use as instructed to check blood sugars 100 each 12  . meclizine (ANTIVERT) 25 MG tablet Take 1 tablet (25 mg total) by mouth 3 (three) times daily as needed. (Patient not taking: Reported on 01/04/2020) 15 tablet 0  . Misc. Devices (BREAST PUMP) MISC Use as directed.  Willow Hands Free Double electric breast pump 1 each 0  . omega-3 acid ethyl esters (LOVAZA) 1 g capsule Take by mouth 2 (two) times daily.    . QMarland KitchenAR REDIHALER 40 MCG/ACT inhaler INHALE 2 PUFFS INTO THE LUNGS TWO TIMES DAILY 10.6 g 2  . Sodium Oxybate (XYREM) 500 MG/ML SOLN Take by mouth 2 (two) times daily. (Patient not taking: Reported on 01/04/2020)      Allergies  Allergen Reactions  . Sulfa Antibiotics Hives    Social History   Socioeconomic History  . Marital status: Married    Spouse name: Not on file  . Number of children: Not on file  . Years of education: Not on file  . Highest education level: Not on file  Occupational History  . Occupation: Pharmacist  Tobacco Use  . Smoking status: Never Smoker  . Smokeless tobacco: Never Used  Vaping Use  . Vaping Use: Never used  Substance and Sexual Activity  . Alcohol use: Yes    Comment: occasional drink on the  weekend.  . Drug use: No  . Sexual activity: Yes    Birth control/protection: None  Other Topics Concern  . Not on file  Social History Narrative   Lives in [place].  Exercises - wt training - 4+ x /wk.     Social Determinants of Health   Financial Resource Strain: Not on file  Food Insecurity: Not on file  Transportation Needs: Not on file  Physical Activity: Not on file  Stress: Not on file  Social Connections: Not on file  Intimate Partner Violence: Not on file    Family History  Problem Relation Age of Onset  . Heart disease Maternal Grandmother   . Stroke  Maternal Grandmother   . Hyperlipidemia Father      Review of Systems  Constitutional: Negative.   HENT: Negative.   Eyes: Negative.   Respiratory: Negative.   Cardiovascular: Negative.   Gastrointestinal: Negative.   Genitourinary: Negative.   Musculoskeletal: Negative.   Skin: Negative.   Neurological: Negative.   Psychiatric/Behavioral: Negative.      Physical Exam: BP 126/74 (BP Location: Left Arm)   Pulse 97   Temp 98.2 F (36.8 C) (Oral)   Resp 18   LMP 10/26/2019   Physical Exam Constitutional:      General: She is not in acute distress.    Appearance: Normal appearance.  HENT:     Head: Normocephalic and atraumatic.  Eyes:     General: No scleral icterus.    Conjunctiva/sclera: Conjunctivae normal.  Neurological:     General: No focal deficit present.     Mental Status: She is alert and oriented to person, place, and time.     Cranial Nerves: No cranial nerve deficit.  Psychiatric:        Mood and Affect: Mood normal.        Behavior: Behavior normal.        Judgment: Judgment normal.     Consults: None  Significant Findings/ Diagnostic Studies: none  Procedures:  NST: Baseline FHR: 140 beats/min Variability: moderate Accelerations: present Decelerations: absent Tocometry: irregular, infrequent  Interpretation:  INDICATIONS: rule out uterine contractions RESULTS:  A NST procedure was performed with FHR monitoring and a normal baseline established, appropriate time of 20-40 minutes of evaluation, and accels >2 seen w 15x15 characteristics.  Results show a REACTIVE NST.    Hospital Course: The patient was admitted to Labor and Delivery Triage for observation. She has normal vital signs. She had a reassuring fetal tracing given her gestational age.  She had infrequent and irregular contractions.  She had no symptoms concerning for infection.  Given the overall picture, we discussed doing a full preterm labor workup, which I offered.  However, after  discussion, we both were comfortable with monitoring symptoms from home for the time being.  She was discharged in stable condition.    Discharge Condition: stable  Disposition: Discharge disposition: 01-Home or Self Care       Diet: Regular diet  Discharge Activity: Activity as tolerated   Allergies as of 04/30/2020      Reactions   Sulfa Antibiotics Hives      Medication List    STOP taking these medications   B-D 3CC LUER-LOK SYR 18GX1-1/2 18G X 1-1/2" 3 ML Misc Generic drug: SYRINGE-NEEDLE (DISP) 3 ML   Clobetasol Propionate 0.05 % shampoo   estradiol 2 MG tablet Commonly known as: ESTRACE   meclizine 25 MG tablet Commonly known as: ANTIVERT   Xyrem 500 MG/ML Soln  Generic drug: Sodium Oxybate     TAKE these medications   Accu-Chek Nano SmartView w/Device Kit 1 kit by Subdermal route as directed. Check blood sugars for fasting, and two hours after breakfast, lunch and dinner (4 checks daily)   Accu-Chek SmartView test strip Generic drug: glucose blood Use as instructed to check blood sugars   Accu-Chek Softclix Lancets lancets 1 each by Other route 4 (four) times daily.   albuterol 108 (90 Base) MCG/ACT inhaler Commonly known as: VENTOLIN HFA Inhale 1 puff into the lungs every 4 (four) hours as needed for wheezing or shortness of breath.   Breast Pump Misc Use as directed.  Willow Hands Free Double electric breast pump   omega-3 acid ethyl esters 1 g capsule Commonly known as: LOVAZA Take by mouth 2 (two) times daily.   Qvar RediHaler 40 MCG/ACT inhaler Generic drug: beclomethasone INHALE 2 PUFFS INTO THE LUNGS TWO TIMES DAILY        Total time spent taking care of this patient: 20 minutes  Signed: Prentice Docker, MD  04/30/2020, 11:37 PM

## 2020-05-02 ENCOUNTER — Observation Stay
Admission: EM | Admit: 2020-05-02 | Discharge: 2020-05-02 | Disposition: A | Discharge status: Home / Self Care | Payer: 59 | Source: Home / Self Care | Admitting: Advanced Practice Midwife

## 2020-05-02 ENCOUNTER — Observation Stay: Payer: 59

## 2020-05-02 DIAGNOSIS — Z3A28 28 weeks gestation of pregnancy: Secondary | ICD-10-CM | POA: Insufficient documentation

## 2020-05-02 DIAGNOSIS — O368190 Decreased fetal movements, unspecified trimester, not applicable or unspecified: Secondary | ICD-10-CM | POA: Diagnosis present

## 2020-05-02 DIAGNOSIS — O09522 Supervision of elderly multigravida, second trimester: Secondary | ICD-10-CM | POA: Insufficient documentation

## 2020-05-02 DIAGNOSIS — O0990 Supervision of high risk pregnancy, unspecified, unspecified trimester: Secondary | ICD-10-CM

## 2020-05-02 DIAGNOSIS — O99512 Diseases of the respiratory system complicating pregnancy, second trimester: Secondary | ICD-10-CM | POA: Insufficient documentation

## 2020-05-02 DIAGNOSIS — G47411 Narcolepsy with cataplexy: Secondary | ICD-10-CM

## 2020-05-02 DIAGNOSIS — O09819 Supervision of pregnancy resulting from assisted reproductive technology, unspecified trimester: Secondary | ICD-10-CM

## 2020-05-02 DIAGNOSIS — O364XX0 Maternal care for intrauterine death, not applicable or unspecified: Secondary | ICD-10-CM

## 2020-05-02 DIAGNOSIS — J45909 Unspecified asthma, uncomplicated: Secondary | ICD-10-CM

## 2020-05-02 DIAGNOSIS — O09513 Supervision of elderly primigravida, third trimester: Secondary | ICD-10-CM

## 2020-05-02 DIAGNOSIS — O99519 Diseases of the respiratory system complicating pregnancy, unspecified trimester: Secondary | ICD-10-CM

## 2020-05-02 DIAGNOSIS — O368120 Decreased fetal movements, second trimester, not applicable or unspecified: Secondary | ICD-10-CM | POA: Insufficient documentation

## 2020-05-02 MED ORDER — LIDOCAINE HCL (PF) 1 % IJ SOLN
INTRAMUSCULAR | Status: AC
  Filled 2020-05-02: qty 30

## 2020-05-02 MED ORDER — AMMONIA AROMATIC IN INHA
RESPIRATORY_TRACT | Status: AC
  Filled 2020-05-02: qty 10

## 2020-05-02 MED ORDER — OXYTOCIN 10 UNIT/ML IJ SOLN
INTRAMUSCULAR | Status: AC
  Filled 2020-05-02: qty 2

## 2020-05-02 MED ORDER — MISOPROSTOL 200 MCG PO TABS
ORAL_TABLET | ORAL | Status: AC
  Filled 2020-05-02: qty 4

## 2020-05-02 NOTE — H&P (Signed)
OB History & Physical   History of Present Illness:  Chief Complaint: decreased fetal movement  HPI:  Tanya Glenn is a 35 y.o. G1P0000 female at 19w2ddated by embryo transfer date.  Her pregnancy has been complicated by advanced maternal age primigravida, pregnancy resulting from assisted reproductive technology, anxiety, mild intermittent asthma, palpitations, hypokalemia, narcolepsy, benign paroxysmal positional vertigo, GERD.    She denies contractions.   She denies leakage of fluid.   She denies vaginal bleeding.   She denies fetal movement. Last reported fetal movement was yesterday morning around 9 AM.    Fetal demise is confirmed by Ob ultrasound. Condolences given to patient and her husband. She is in contact with her mom ([facility]) and with Dr JGlennon Macfor support and plan of management.  Total weight gain for pregnancy: 17.2 kg   Obstetrical Problem List: pregnancy Problems (from 01/04/20 to present)    Problem Noted Resolved   Supervision of high risk pregnancy, antepartum 01/04/2020 by JWill Bonnet MD No   Overview Addendum 01/08/2020 12:33 PM by JWill Bonnet MD    Clinic [facility] Prenatal Labs  Dating Day 5 embryo xfer Blood type: O/Positive/-- (11/15 1455)   Genetic Screen NIPS: Antibody:Negative (11/15 1455)  Anatomic [place] Rubella: 8.19 (11/15 1455)  Varicella: immune  GTT Early:               Third trimester:  RPR: Non Reactive (11/15 1455)   Rhogam  HBsAg: Negative (11/15 1455)   TDaP vaccine                       Flu Shot: HIV: Non Reactive (11/15 1455)   Baby Food                                GBS:   Contraception  Pap: up to date, normal  CBB     CS/VBAC    Support Person Husband: CJovita Kussmaul          Previous Version   Encounter for supervision of pregnancy resulting from assisted reproductive technology 01/04/2020 by JWill Bonnet MD No   Overview Addendum 04/01/2020  2:19 PM by JWill Bonnet MD    '[x]'  Echo at 22  weeks - nml on 03/14/20      Previous Version   Advanced maternal age, primigravida 01/04/2020 by JWill Bonnet MD No   Overview Addendum 01/08/2020 12:32 PM by JWill Bonnet MD    '[ ]'  NIPS testing pending, Euploid on PGS      Previous Version   Asthma during pregnancy 01/04/2020 by JWill Bonnet MD No   Narcolepsy 12/21/2016 by SLeone Haven MD No   Overview Signed 01/08/2020 12:32 PM by JWill Bonnet MD    - with mild cataplexy. - discontinued medication for pregnancy - ?c-section for delivery vs vaginal delivery.  Vaginal delivery should be OK.  Will continue to discuss depending on patient concerns.           Maternal Medical History:   Past Medical History:  Diagnosis Date  . Asthma   . Asthma   . Narcolepsy   . Symptomatic PVCs     Past Surgical History:  Procedure Laterality Date  . WISDOM TOOTH EXTRACTION      Allergies  Allergen Reactions  . Sulfa Antibiotics Hives    Prior to Admission medications  Medication Sig Start Date End Date Taking? Authorizing Provider  Accu-Chek Softclix Lancets lancets 1 each by Other route 4 (four) times daily. 01/18/20   Will Bonnet, MD  albuterol (VENTOLIN HFA) 108 (90 Base) MCG/ACT inhaler Inhale 1 puff into the lungs every 4 (four) hours as needed for wheezing or shortness of breath. 07/27/19   Leone Haven, MD  Blood Glucose Monitoring Suppl (ACCU-CHEK NANO SMARTVIEW) w/Device KIT 1 kit by Subdermal route as directed. Check blood sugars for fasting, and two hours after breakfast, lunch and dinner (4 checks daily) 01/18/20   Will Bonnet, MD  glucose blood (ACCU-CHEK SMARTVIEW) test strip Use as instructed to check blood sugars 01/18/20   Will Bonnet, MD  Misc. Devices (BREAST PUMP) MISC Use as directed.  Willow Hands Free Double electric breast pump 04/29/20   Will Bonnet, MD  omega-3 acid ethyl esters (LOVAZA) 1 g capsule Take by mouth 2 (two) times daily.     [provider]  QVAR REDIHALER 40 MCG/ACT inhaler INHALE 2 PUFFS INTO THE LUNGS TWO TIMES DAILY 04/12/20   Leone Haven, MD    OB History  Gravida Para Term Preterm AB Living  1 0 0 0 0 0  SAB IAB Ectopic Multiple Live Births  0 0 0 0      # Outcome Date GA Lbr Len/2nd Weight Sex Delivery Anes PTL Lv  1 Current             Prenatal care site: [facility]  Social History: She  reports that she has never smoked. She has never used smokeless tobacco. She reports current alcohol use. She reports that she does not use drugs.  Family History: family history includes Heart disease in her maternal grandmother; Hyperlipidemia in her father; Stroke in her maternal grandmother.    Review of Systems:  Review of Systems  Constitutional: Negative for chills and fever.  HENT: Negative for congestion, ear discharge, ear pain, hearing loss, sinus pain and sore throat.   Eyes: Negative for blurred vision and double vision.  Respiratory: Negative for cough, shortness of breath and wheezing.   Cardiovascular: Negative for chest pain, palpitations and leg swelling.  Gastrointestinal: Negative for abdominal pain, blood in stool, constipation, diarrhea, heartburn, melena, nausea and vomiting.  Genitourinary: Negative for dysuria, flank pain, frequency, hematuria and urgency.  Musculoskeletal: Negative for back pain, joint pain and myalgias.  Skin: Negative for itching and rash.  Neurological: Negative for dizziness, tingling, tremors, sensory change, speech change, focal weakness, seizures, loss of consciousness, weakness and headaches.  Endo/Heme/Allergies: Negative for environmental allergies. Does not bruise/bleed easily.  Psychiatric/Behavioral: Negative for depression, hallucinations, memory loss, substance abuse and suicidal ideas. The patient is not nervous/anxious and does not have insomnia.      Physical Exam:  BP: 131/74, P: 92, SpO2: 100%, LMP 10/26/2019     Contractions: none  Bedside Ultrasound: RN attempted doppler heart tones, no visible heart tones by BS ultrasound  ADDENDUM REPORT: 05/02/2020 05:45  ADDENDUM: These results were called by telephone at the time of interpretation on 05/02/2020 at 5:44 am to provider Rod Can , who verbally acknowledged these results.   Electronically Signed   By: Iven Finn M.D.   On: 05/02/2020 05:45   Addended by Irving Burton, MD on 05/02/2020 5:48 AM    Study Result  Narrative & Impression  CLINICAL DATA:  No fetal movement. Gestational age that is assigned of 28 weeks and 2 days. Due  date by last menstrual period of 07/23/2020  EXAM: LIMITED OBSTETRIC ULTRASOUND  COMPARISON:  US ob 02/29/20.  FINDINGS: Number of Fetuses: 1  Heart Rate: No heart rate visualized.  Movement: No  Presentation: Cephalic  Placental Location: Fundal anterior  Previa: No  Amniotic Fluid (Subjective):  Within normal limits.  BPD: 7.5  cm 30 w  1 d  Other: No blood flow seen to placenta.  IMPRESSION: No heart rate, fetal movement, and no blood flow to placenta noted consistent with fetal demise.  This exam is performed on an emergent basis and does not comprehensively evaluate fetal size, dating, or anatomy; follow-up complete OB US should be considered if further fetal assessment is warranted.  Electronically Signed: By: Iven Finn M.D. On: 05/02/2020 05:41      No results found for: [id number]]  Assessment:  Adriel Desrosier is a 35 y.o. G1P0000 female at 56w2dwith fetal demise.   Plan:  1. Admit to observation 2. Stat ultrasound  3. Patient to discuss plan of care/delivery planning with Dr JDot Been CNM 05/02/2020 6:54 AM

## 2020-05-02 NOTE — Progress Notes (Signed)
Pt arrived to [facility] with complaints of decreased fetal movement. Pt stated last time she felt baby move was 0930 the previous day (05/01/20). Difficulty auscultating fetal heart tones with [place] monitor by RN. CNM called to bedside to perform bedside ultrasound.

## 2020-05-02 NOTE — Discharge Summary (Signed)
Physician Final Progress Note  Patient ID: Tanya Glenn MRN: [id number] DOB/AGE: 1986-01-20 35 y.o.  Admit date: 05/02/2020 Admitting provider: Rod Can, CNM Discharge date: 05/02/2020   Admission Diagnoses: decreased fetal movement  Discharge Diagnoses:  Active Problems:   Decreased fetal movement   Fetal demise, greater than 22 weeks, antepartum   History of Present Illness: The patient is a 35 y.o. female G1P0000 at 35w2dwho presents for decreased fetal movement over the past 20 hours. She was seen Saturday night for abdominal pain/contractions. NST at that time was reactive/WNL. She denies contractions at this time. She denies vaginal bleeding or fluid leaking. Bedside and Ob ultrasounds done confirming fetal demise. Condolences given. She has spoken with [name] has made a plan to discharge to home this morning and return for induction in the next couple of days after her mom returns. Patient is accompanied by her husband. They are grieving the loss of their baby. Their questions have been answered and they are discharged to home with instructions and precautions.     Past Medical History:  Diagnosis Date   Asthma    Asthma    Narcolepsy    Symptomatic PVCs     Past Surgical History:  Procedure Laterality Date   WISDOM TOOTH EXTRACTION      No current facility-administered medications on file prior to encounter.   Current Outpatient Medications on File Prior to Encounter  Medication Sig Dispense Refill   Accu-Chek Softclix Lancets lancets 1 each by Other route 4 (four) times daily. 100 each 12   albuterol (VENTOLIN HFA) 108 (90 Base) MCG/ACT inhaler Inhale 1 puff into the lungs every 4 (four) hours as needed for wheezing or shortness of breath. 8 g 2   Blood Glucose Monitoring Suppl (ACCU-CHEK NANO SMARTVIEW) w/Device KIT 1 kit by Subdermal route as directed. Check blood sugars for fasting, and two hours after breakfast, lunch and dinner (4 checks  daily) 1 kit 0   glucose blood (ACCU-CHEK SMARTVIEW) test strip Use as instructed to check blood sugars 100 each 12   Misc. Devices (BREAST PUMP) MISC Use as directed.  Willow Hands Free Double electric breast pump 1 each 0   omega-3 acid ethyl esters (LOVAZA) 1 g capsule Take by mouth 2 (two) times daily.     QVAR REDIHALER 40 MCG/ACT inhaler INHALE 2 PUFFS INTO THE LUNGS TWO TIMES DAILY 10.6 g 2    Allergies  Allergen Reactions   Sulfa Antibiotics Hives    Social History   Socioeconomic History   Marital status: Married    Spouse name: Not on file   Number of children: Not on file   Years of education: Not on file   Highest education level: Not on file  Occupational History   Occupation: Pharmacist  Tobacco Use   Smoking status: Never Smoker   Smokeless tobacco: Never Used  Vaping Use   Vaping Use: Never used  Substance and Sexual Activity   Alcohol use: Yes    Comment: occasional drink on the weekend.   Drug use: No   Sexual activity: Yes    Birth control/protection: None  Other Topics Concern   Not on file  Social History Narrative   [place] in [place]  Exercises - wt training - 4+ x /wk.     Social Determinants of Health   Financial Resource Strain: Not on file  Food Insecurity: Not on file  Transportation Needs: Not on file  Physical Activity: Not on file  Stress:  Not on file  Social Connections: Not on file  Intimate Partner Violence: Not on file    Family History  Problem Relation Age of Onset   Heart disease Maternal Grandmother    Stroke Maternal Grandmother    Hyperlipidemia Father      Review of Systems  Constitutional: Negative for chills and fever.  HENT: Negative for congestion, ear discharge, ear pain, hearing loss, sinus pain and sore throat.   Eyes: Negative for blurred vision and double vision.  Respiratory: Negative for cough, shortness of breath and wheezing.   Cardiovascular: Negative for chest pain, palpitations and  leg swelling.  Gastrointestinal: Negative for abdominal pain, blood in stool, constipation, diarrhea, heartburn, melena, nausea and vomiting.  Genitourinary: Negative for dysuria, flank pain, frequency, hematuria and urgency.  Musculoskeletal: Negative for back pain, joint pain and myalgias.  Skin: Negative for itching and rash.  Neurological: Negative for dizziness, tingling, tremors, sensory change, speech change, focal weakness, seizures, loss of consciousness, weakness and headaches.  Endo/Heme/Allergies: Negative for environmental allergies. Does not bruise/bleed easily.  Psychiatric/Behavioral: Negative for depression, hallucinations, memory loss, substance abuse and suicidal ideas. The patient is not nervous/anxious and does not have insomnia.      Physical Exam: BP 131/74    Pulse 92    Temp 98.3 F (36.8 C) (Oral)    Resp 20    LMP 10/26/2019    SpO2 100%   Constitutional: Well nourished, well developed female in acute emotional distress upon learning of fetal demise HEENT: normal Skin: Warm and dry.  Cardiovascular: Regular rate and rhythm.   Extremity: no edema Respiratory: Clear to auscultation bilateral. Normal respiratory effort Abdomen: fundus soft, nontender  Back: no CVAT Neuro: DTRs 2+, Cranial nerves grossly intact Psych: Alert and Oriented x3. No memory deficits. Normal mood and affect.    Consults: None  Significant Findings/ Diagnostic Studies: ultrasound  CLINICAL DATA:  No fetal movement. Gestational age that is assigned of 28 weeks and 2 days. Due date by last menstrual period of 07/23/2020  EXAM: LIMITED OBSTETRIC ULTRASOUND  COMPARISON:  US ob 02/29/20.  FINDINGS: Number of Fetuses: 1  Heart Rate: No heart rate visualized.  Movement: No  Presentation: Cephalic  Placental Location: Fundal anterior  Previa: No  Amniotic Fluid (Subjective):  Within normal limits.  BPD: 7.5  cm 30 w  1 d  Other: No blood flow seen to  placenta.  IMPRESSION: No heart rate, fetal movement, and no blood flow to placenta noted consistent with fetal demise.  This exam is performed on an emergent basis and does not comprehensively evaluate fetal size, dating, or anatomy; follow-up complete OB US should be considered if further fetal assessment is warranted.  Electronically Signed: By: Iven Finn M.D. On: 05/02/2020 05:41   Procedures: none  Hospital Course: The patient was admitted to Labor and Delivery Triage for observation.   Discharge Condition: good  Disposition: Discharge disposition: 01-Home or Self Care  Diet: Regular diet  Discharge Activity: Activity as tolerated  Discharge Instructions    Discharge activity:  No Restrictions   Complete by: As directed    Discharge diet:  No restrictions   Complete by: As directed      Allergies as of 05/02/2020      Reactions   Sulfa Antibiotics Hives      Medication List    TAKE these medications   Accu-Chek Nano SmartView w/Device Kit 1 kit by Subdermal route as directed. Check blood sugars for fasting, and two  hours after breakfast, lunch and dinner (4 checks daily)   Accu-Chek SmartView test strip Generic drug: glucose blood Use as instructed to check blood sugars   Accu-Chek Softclix Lancets lancets 1 each by Other route 4 (four) times daily.   albuterol 108 (90 Base) MCG/ACT inhaler Commonly known as: VENTOLIN HFA Inhale 1 puff into the lungs every 4 (four) hours as needed for wheezing or shortness of breath.   Breast Pump Misc Use as directed.  Willow Hands Free Double electric breast pump   omega-3 acid ethyl esters 1 g capsule Commonly known as: LOVAZA Take by mouth 2 (two) times daily.   Qvar RediHaler 40 MCG/ACT inhaler Generic drug: beclomethasone INHALE 2 PUFFS INTO THE LUNGS TWO TIMES DAILY        Total time spent taking care of this patient: 20 minutes  Signed: Rod Can, CNM  05/02/2020, 7:19 AM

## 2020-05-02 NOTE — Progress Notes (Signed)
Pt discharged with significant other. Discharge instructions given. Pts questions answered.

## 2020-05-03 ENCOUNTER — Other Ambulatory Visit: Payer: Self-pay

## 2020-05-03 ENCOUNTER — Inpatient Hospital Stay
Admission: EM | Admit: 2020-05-03 | Discharge: 2020-05-05 | DRG: 806 | Disposition: A | Discharge status: Home / Self Care | Payer: 59 | Attending: Obstetrics and Gynecology | Admitting: Obstetrics and Gynecology

## 2020-05-03 ENCOUNTER — Other Ambulatory Visit: Payer: Self-pay | Admitting: Obstetrics & Gynecology

## 2020-05-03 ENCOUNTER — Encounter: Payer: Self-pay | Admitting: Obstetrics and Gynecology

## 2020-05-03 DIAGNOSIS — J4520 Mild intermittent asthma, uncomplicated: Secondary | ICD-10-CM | POA: Diagnosis present

## 2020-05-03 DIAGNOSIS — G47419 Narcolepsy without cataplexy: Secondary | ICD-10-CM | POA: Diagnosis present

## 2020-05-03 DIAGNOSIS — Z20822 Contact with and (suspected) exposure to covid-19: Secondary | ICD-10-CM | POA: Diagnosis present

## 2020-05-03 DIAGNOSIS — O364XX0 Maternal care for intrauterine death, not applicable or unspecified: Principal | ICD-10-CM | POA: Diagnosis present

## 2020-05-03 DIAGNOSIS — O9952 Diseases of the respiratory system complicating childbirth: Secondary | ICD-10-CM | POA: Diagnosis present

## 2020-05-03 DIAGNOSIS — O99519 Diseases of the respiratory system complicating pregnancy, unspecified trimester: Secondary | ICD-10-CM | POA: Diagnosis present

## 2020-05-03 DIAGNOSIS — Z3A28 28 weeks gestation of pregnancy: Secondary | ICD-10-CM | POA: Diagnosis not present

## 2020-05-03 DIAGNOSIS — O0990 Supervision of high risk pregnancy, unspecified, unspecified trimester: Secondary | ICD-10-CM

## 2020-05-03 DIAGNOSIS — O09513 Supervision of elderly primigravida, third trimester: Secondary | ICD-10-CM | POA: Diagnosis not present

## 2020-05-03 DIAGNOSIS — O09819 Supervision of pregnancy resulting from assisted reproductive technology, unspecified trimester: Secondary | ICD-10-CM

## 2020-05-03 DIAGNOSIS — O99354 Diseases of the nervous system complicating childbirth: Secondary | ICD-10-CM | POA: Diagnosis present

## 2020-05-03 DIAGNOSIS — O26893 Other specified pregnancy related conditions, third trimester: Secondary | ICD-10-CM

## 2020-05-03 DIAGNOSIS — J45909 Unspecified asthma, uncomplicated: Secondary | ICD-10-CM | POA: Diagnosis present

## 2020-05-03 DIAGNOSIS — O09813 Supervision of pregnancy resulting from assisted reproductive technology, third trimester: Secondary | ICD-10-CM | POA: Diagnosis not present

## 2020-05-03 DIAGNOSIS — G47411 Narcolepsy with cataplexy: Secondary | ICD-10-CM

## 2020-05-03 MED ORDER — SOD CITRATE-CITRIC ACID 500-334 MG/5ML PO SOLN: 30.0000 mL | ORAL | Status: DC | PRN

## 2020-05-03 MED ORDER — ONDANSETRON HCL 4 MG/2ML IJ SOLN
4.0000 mg | Freq: Four times a day (QID) | INTRAMUSCULAR | Status: DC | PRN
  Administered 2020-05-04: 4 mg via INTRAVENOUS
  Filled 2020-05-03: qty 2

## 2020-05-03 MED ORDER — LACTATED RINGERS IV SOLN: INTRAVENOUS | Status: DC

## 2020-05-03 MED ORDER — OXYTOCIN 10 UNIT/ML IJ SOLN: 10.0000 [IU] | Freq: Once | INTRAMUSCULAR | Status: DC

## 2020-05-03 MED ORDER — MELATONIN 3 MG PO TABS
3.0000 mg | ORAL_TABLET | Freq: Every day | ORAL | Status: DC
  Filled 2020-05-03: qty 1

## 2020-05-03 MED ORDER — OXYTOCIN BOLUS FROM INFUSION
333.0000 mL | Freq: Once | INTRAVENOUS | Status: AC
  Administered 2020-05-04: 333 mL via INTRAVENOUS

## 2020-05-03 MED ORDER — OXYTOCIN-SODIUM CHLORIDE 30-0.9 UT/500ML-% IV SOLN
2.5000 [IU]/h | INTRAVENOUS | Status: DC
  Administered 2020-05-04: 2.5 [IU]/h via INTRAVENOUS

## 2020-05-03 MED ORDER — BUTORPHANOL TARTRATE 1 MG/ML IJ SOLN
1.0000 mg | INTRAMUSCULAR | Status: DC | PRN
Start: 2020-05-03 — End: 2020-05-04
  Administered 2020-05-04 (×2): 1 mg via INTRAVENOUS
  Filled 2020-05-03 (×2): qty 1

## 2020-05-03 MED ORDER — LIDOCAINE HCL (PF) 1 % IJ SOLN: 30.0000 mL | INTRAMUSCULAR | Status: DC | PRN

## 2020-05-03 MED ORDER — MISOPROSTOL 25 MCG QUARTER TABLET: 25.0000 ug | ORAL_TABLET | ORAL | Status: DC | PRN

## 2020-05-03 MED ORDER — MISOPROSTOL 200 MCG PO TABS
200.0000 ug | ORAL_TABLET | ORAL | Status: DC
  Administered 2020-05-04 (×2): 200 ug via VAGINAL
  Filled 2020-05-03 (×3): qty 1

## 2020-05-03 MED ORDER — MISOPROSTOL 200 MCG PO TABS
ORAL_TABLET | ORAL | Status: AC
  Administered 2020-05-03: 200 ug
  Filled 2020-05-03: qty 1

## 2020-05-03 MED ORDER — ACETAMINOPHEN 325 MG PO TABS
650.0000 mg | ORAL_TABLET | Freq: Four times a day (QID) | ORAL | Status: DC | PRN
  Administered 2020-05-03: 650 mg via ORAL
  Filled 2020-05-03: qty 2

## 2020-05-03 MED ORDER — LACTATED RINGERS IV SOLN
500.0000 mL | INTRAVENOUS | Status: DC | PRN
  Administered 2020-05-04 (×2): 500 mL via INTRAVENOUS

## 2020-05-03 NOTE — Addendum Note (Signed)
Addended by: Thomasene Mohair D on: 05/03/2020 12:08 PM   Modules accepted: Orders, SmartSet

## 2020-05-03 NOTE — H&P (Signed)
Obstetrics Admission History & Physical   CC: fetal demise   HPI:  35 y.o. G1P0000 @ [redacted]w[redacted]d(07/23/2020, by Embryo Transfer). Admitted on 05/03/2020:   Presents for induction of labor secondary to recent diagnosis of fetal demise.  Pt was seen 3 days ago for crampy lower abdominal pains without bleeding or ROM, and found to have reactive NST and reassuring fetal and maternal monitoring.  A day later (after she went home and resumed normal although light activities) she noted development of decreased fetal movements, and eventually returned to have this checked, and was then noted to have absence of fetal cardiac activity, by doppler, and 2 ultrasound exams. The cramping was less this past 24-48 hours and no bleeding or leaking fluid.  Pregnancy by IVF with preimplantation genetic testing as normal, and normal ultrasound and fetal echo during this pregnancy, also normal NIPT.  She is AMA.  She has narcolepsy, so has been checking blood sugars, but has not had diagnosis of GDM.  No recent trauma, travel, or infection.  She received the covid booster 4 days ago.     Patient Active Problem List   Diagnosis Date Noted  . Decreased fetal movement 05/02/2020  . Fetal demise, greater than 22 weeks, antepartum 05/02/2020  . Abdominal pain during pregnancy in third trimester 04/30/2020  . Supervision of high risk pregnancy, antepartum 01/04/2020  . Encounter for supervision of pregnancy resulting from assisted reproductive technology 01/04/2020  . Advanced maternal age, primigravida 01/04/2020  . Asthma during pregnancy 01/04/2020  . Dizziness 09/01/2019  . Asthma exacerbation 05/23/2018  . Lipid screening 04/29/2018  . Pre-conception counseling 04/29/2018  . BPPV (benign paroxysmal positional vertigo) 06/26/2017  . GERD (gastroesophageal reflux disease) 06/26/2017  . Narcolepsy 12/21/2016  . Palpitations 10/08/2016  . Hypokalemia 10/08/2016  . Asthma, mild intermittent 02/16/2016  . Leg pain, right  09/26/2015  . Anxiety 02/05/2012  . 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 PMHx:  Past Medical History:  Diagnosis Date  . Asthma   . Asthma   . Narcolepsy   . Symptomatic PVCs    PSHx:  Past Surgical History:  Procedure Laterality Date  . WISDOM TOOTH EXTRACTION     Medications:  Medications Prior to Admission  Medication Sig Dispense Refill Last Dose  . albuterol (VENTOLIN HFA) 108 (90 Base) MCG/ACT inhaler Inhale 1 puff into the lungs every 4 (four) hours as needed for wheezing or shortness of breath. 8 g 2 Past Month at Unknown time  . omega-3 acid ethyl esters (LOVAZA) 1 g capsule Take by mouth 2 (two) times daily.   05/03/2020 at Unknown time  . QVAR REDIHALER 40 MCG/ACT inhaler INHALE 2 PUFFS INTO THE LUNGS TWO TIMES DAILY 10.6 g 2 Past Month at Unknown time  . Accu-Chek Softclix Lancets lancets 1 each by Other route 4 (four) times daily. (Patient not taking: No sig reported) 100 each 12 Unknown at Unknown time  . Blood Glucose Monitoring Suppl (ACCU-CHEK NANO SMARTVIEW) w/Device KIT 1 kit by Subdermal route as directed. Check blood sugars for fasting, and two hours after breakfast, lunch and dinner (4 checks daily) (Patient not taking: No sig reported) 1 kit 0 Unknown at Unknown time  . glucose blood (ACCU-CHEK SMARTVIEW) test strip Use as instructed to check blood sugars (Patient not taking: No sig reported) 100 each 12 Unknown at Unknown time  . Misc. Devices (BREAST PUMP) MISC Use as  directed.  Willow Hands Free Double electric breast pump (Patient not taking: No sig reported) 1 each 0 Unknown at Unknown time   Allergies: is allergic to sulfa antibiotics. OBHx:  OB History  Gravida Para Term Preterm AB Living  1 0 0 0 0 0  SAB IAB Ectopic Multiple Live Births  0 0 0 0      # Outcome Date GA Lbr Len/2nd Weight Sex Delivery Anes PTL Lv  1 Current            HEK:BTCYELYH/TMBPJPETKKOE except  as detailed in HPI.Marland Kitchen  No family history of birth defects. Soc Hx: Never smoker, Recreational drug use: none and Pregnancy welcomed  Objective:  There were no vitals filed for this visit. Constitutional: Well nourished, well developed female in no acute distress.  HEENT: normal Skin: Warm and dry.  Cardiovascular:Regular rate and rhythm.   Extremity: trace to 1+ bilateral pedal edema Respiratory: Clear to auscultation bilateral. Normal respiratory effort Abdomen: gravid, ND, FHT present, mild, without guarding, without rebound tenderness on exam Back: no CVAT Neuro: DTRs 2+, Cranial nerves grossly intact Psych: Alert and Oriented x3. No memory deficits. Normal mood and affect.  MS: normal gait, normal bilateral lower extremity ROM/strength/stability.  Pelvic exam: is not limited by body habitus EGBUS: within normal limits Vagina: within normal limits and with normal mucosa Cervix: CERVIX: 0-1 cm dilated, 50 effaced, -3/high station Vtx Uterus: Spontaneous uterine activity  Adnexa: not evaluated  A NST procedure was performed with FHR monitoring and a normal baseline established, appropriate time of 20-40 minutes of evaluation, and accels >2 seen w 15x15 characteristics.  Results show a REACTIVE NST.   Contractions are noted every 5-7 min, noted mild or absent by patient  [place]- Vtx, good amniotic fluid, absent FHT   Perinatal info:  Blood type: O positive Rubella- Immune Varicella -Immune RPR NR / HIV Neg/ HBsAg Neg   Assessment & Plan:   35 y.o. G1P0000 @ [redacted]w[redacted]d Admitted on 05/03/2020:fetal demise IOL    Discussed possibilities for etiology, and likely will not truly know the source.  Abruption is a possibility, as she was having pains preceding demise, despite no obvious bleeding or UKoreafindings. Possibilities for infection discussed, and titres will be checked. Blood clotting disorder although rare is something that may repeat itself, and may be checked. Autopsy and genetic  testing options discussed.  As she has had ample genetic testing and echo/US studies prior to and throughout this pregnancy, this would have a low yield, but may be considered.  Labor induction, Cytotec, and analgesia d//w pt.  A total of 60 minutes were spent face-to-face with the patient as well as preparation, review, communication, and documentation during this encounter.   PBarnett Applebaum MD, [facility] Pardon[facility]/Gyn, [facility] 05/03/2020  11:34 PM

## 2020-05-04 ENCOUNTER — Encounter: Payer: Self-pay | Admitting: Obstetrics and Gynecology

## 2020-05-04 ENCOUNTER — Inpatient Hospital Stay: Payer: 59 | Admitting: Anesthesiology

## 2020-05-04 DIAGNOSIS — O364XX0 Maternal care for intrauterine death, not applicable or unspecified: Secondary | ICD-10-CM | POA: Diagnosis not present

## 2020-05-04 DIAGNOSIS — O09813 Supervision of pregnancy resulting from assisted reproductive technology, third trimester: Secondary | ICD-10-CM

## 2020-05-04 DIAGNOSIS — Z3A28 28 weeks gestation of pregnancy: Secondary | ICD-10-CM

## 2020-05-04 DIAGNOSIS — O09513 Supervision of elderly primigravida, third trimester: Secondary | ICD-10-CM | POA: Diagnosis not present

## 2020-05-04 LAB — PROTIME-INR
INR: 0.9 (ref 0.8–1.2)
Prothrombin Time: 12 seconds (ref 11.4–15.2)

## 2020-05-04 LAB — CBC
HCT: 32.1 % — ABNORMAL LOW (ref 36.0–46.0)
Hemoglobin: 11.1 g/dL — ABNORMAL LOW (ref 12.0–15.0)
MCH: 31.3 pg (ref 26.0–34.0)
MCHC: 34.6 g/dL (ref 30.0–36.0)
MCV: 90.4 fL (ref 80.0–100.0)
Platelets: 133 K/uL — ABNORMAL LOW (ref 150–400)
RBC: 3.55 MIL/uL — ABNORMAL LOW (ref 3.87–5.11)
RDW: 12.8 % (ref 11.5–15.5)
WBC: 9.4 K/uL (ref 4.0–10.5)
nRBC: 0 % (ref 0.0–0.2)

## 2020-05-04 LAB — RESP PANEL BY RT-PCR (FLU A&B, COVID) ARPGX2
Influenza A by PCR: NEGATIVE
Influenza B by PCR: NEGATIVE
SARS Coronavirus 2 by RT PCR: NEGATIVE

## 2020-05-04 LAB — SYPHILIS: RPR W/REFLEX TO RPR TITER AND TREPONEMAL ANTIBODIES, TRADITIONAL SCREENING AND DIAGNOSIS ALGORITHM: RPR Ser Ql: NONREACTIVE

## 2020-05-04 LAB — ABO/RH: ABO/RH(D): O POS

## 2020-05-04 LAB — TYPE AND SCREEN
ABO/RH(D): O POS
Antibody Screen: NEGATIVE

## 2020-05-04 LAB — KLEIHAUER-BETKE STAIN

## 2020-05-04 MED ORDER — TERBUTALINE SULFATE 1 MG/ML IJ SOLN: 0.2500 mg | Freq: Once | INTRAMUSCULAR | Status: DC | PRN

## 2020-05-04 MED ORDER — SODIUM CHLORIDE 0.9 % IV SOLN
INTRAVENOUS | Status: DC | PRN
  Administered 2020-05-04 (×2): 5 mL via EPIDURAL

## 2020-05-04 MED ORDER — HYDROMORPHONE HCL 1 MG/ML IJ SOLN
0.5000 mg | INTRAMUSCULAR | Status: DC | PRN
Start: 2020-05-04 — End: 2020-05-04
  Administered 2020-05-04: 0.5 mg via INTRAVENOUS

## 2020-05-04 MED ORDER — EPHEDRINE 5 MG/ML INJ: 10.0000 mg | INTRAVENOUS | Status: DC | PRN

## 2020-05-04 MED ORDER — LIDOCAINE HCL (PF) 1 % IJ SOLN
INTRAMUSCULAR | Status: AC
  Filled 2020-05-04: qty 30

## 2020-05-04 MED ORDER — COCONUT OIL OIL: 1.0000 "application " | TOPICAL_OIL | Status: DC | PRN

## 2020-05-04 MED ORDER — OXYTOCIN 10 UNIT/ML IJ SOLN
INTRAMUSCULAR | Status: AC
  Filled 2020-05-04: qty 2

## 2020-05-04 MED ORDER — PHENYLEPHRINE 40 MCG/ML (10ML) SYRINGE FOR IV PUSH (FOR BLOOD PRESSURE SUPPORT): 80.0000 ug | PREFILLED_SYRINGE | INTRAVENOUS | Status: DC | PRN

## 2020-05-04 MED ORDER — WITCH HAZEL-GLYCERIN EX PADS: 1.0000 "application " | MEDICATED_PAD | CUTANEOUS | Status: DC | PRN

## 2020-05-04 MED ORDER — DIPHENHYDRAMINE HCL 25 MG PO CAPS: 25.0000 mg | ORAL_CAPSULE | Freq: Four times a day (QID) | ORAL | Status: DC | PRN

## 2020-05-04 MED ORDER — PRENATAL MULTIVITAMIN CH
1.0000 | ORAL_TABLET | Freq: Every day | ORAL | Status: DC
  Administered 2020-05-05: 1 via ORAL
  Filled 2020-05-04: qty 1

## 2020-05-04 MED ORDER — IBUPROFEN 600 MG PO TABS
600.0000 mg | ORAL_TABLET | Freq: Four times a day (QID) | ORAL | Status: DC
  Administered 2020-05-04 – 2020-05-05 (×3): 600 mg via ORAL
  Filled 2020-05-04 (×3): qty 1

## 2020-05-04 MED ORDER — AMMONIA AROMATIC IN INHA
RESPIRATORY_TRACT | Status: AC
  Filled 2020-05-04: qty 10

## 2020-05-04 MED ORDER — ONDANSETRON HCL 4 MG/2ML IJ SOLN: 4.0000 mg | INTRAMUSCULAR | Status: DC | PRN

## 2020-05-04 MED ORDER — ACETAMINOPHEN 325 MG PO TABS
650.0000 mg | ORAL_TABLET | ORAL | Status: DC | PRN
  Administered 2020-05-04 – 2020-05-05 (×2): 650 mg via ORAL
  Filled 2020-05-04 (×2): qty 2

## 2020-05-04 MED ORDER — LIDOCAINE HCL (PF) 1 % IJ SOLN
INTRAMUSCULAR | Status: DC | PRN
  Administered 2020-05-04: 1 mL

## 2020-05-04 MED ORDER — DIBUCAINE (PERIANAL) 1 % EX OINT: 1.0000 "application " | TOPICAL_OINTMENT | CUTANEOUS | Status: DC | PRN

## 2020-05-04 MED ORDER — ALBUTEROL SULFATE HFA 108 (90 BASE) MCG/ACT IN AERS
1.0000 | INHALATION_SPRAY | RESPIRATORY_TRACT | Status: DC | PRN
  Filled 2020-05-04: qty 6.7

## 2020-05-04 MED ORDER — CARBOPROST TROMETHAMINE 250 MCG/ML IM SOLN
INTRAMUSCULAR | Status: AC
  Filled 2020-05-04: qty 1

## 2020-05-04 MED ORDER — FENTANYL 2.5 MCG/ML W/ROPIVACAINE 0.15% IN NS 100 ML EPIDURAL (ARMC)
12.0000 mL/h | EPIDURAL | Status: DC
Start: 2020-05-04 — End: 2020-05-04
  Administered 2020-05-04: 12 mL/h via EPIDURAL

## 2020-05-04 MED ORDER — LACTATED RINGERS IV SOLN: 500.0000 mL | Freq: Once | INTRAVENOUS | Status: DC

## 2020-05-04 MED ORDER — SIMETHICONE 80 MG PO CHEW: 80.0000 mg | CHEWABLE_TABLET | ORAL | Status: DC | PRN

## 2020-05-04 MED ORDER — OXYTOCIN-SODIUM CHLORIDE 30-0.9 UT/500ML-% IV SOLN
1.0000 m[IU]/min | INTRAVENOUS | Status: DC
  Administered 2020-05-04: 600 m[IU]/min via INTRAVENOUS
  Filled 2020-05-04: qty 500

## 2020-05-04 MED ORDER — HYDROMORPHONE HCL 1 MG/ML IJ SOLN
1.0000 mg | INTRAMUSCULAR | Status: DC | PRN
Start: 2020-05-04 — End: 2020-05-04
  Filled 2020-05-04: qty 1

## 2020-05-04 MED ORDER — HYDROXYZINE HCL 25 MG PO TABS
25.0000 mg | ORAL_TABLET | Freq: Four times a day (QID) | ORAL | Status: DC | PRN
  Administered 2020-05-04: 25 mg via ORAL
  Filled 2020-05-04 (×2): qty 1

## 2020-05-04 MED ORDER — FENTANYL 2.5 MCG/ML W/ROPIVACAINE 0.15% IN NS 100 ML EPIDURAL (ARMC)
EPIDURAL | Status: AC
  Filled 2020-05-04: qty 100

## 2020-05-04 MED ORDER — BENZOCAINE-MENTHOL 20-0.5 % EX AERO: 1.0000 "application " | INHALATION_SPRAY | CUTANEOUS | Status: DC | PRN

## 2020-05-04 MED ORDER — MISOPROSTOL 200 MCG PO TABS
ORAL_TABLET | ORAL | Status: AC
  Administered 2020-05-04: 800 ug
  Filled 2020-05-04: qty 4

## 2020-05-04 MED ORDER — METHYLERGONOVINE MALEATE 0.2 MG/ML IJ SOLN
INTRAMUSCULAR | Status: AC
  Filled 2020-05-04: qty 1

## 2020-05-04 MED ORDER — FERROUS SULFATE 325 (65 FE) MG PO TABS
325.0000 mg | ORAL_TABLET | Freq: Two times a day (BID) | ORAL | Status: DC
  Administered 2020-05-05: 325 mg via ORAL
  Filled 2020-05-04: qty 1

## 2020-05-04 MED ORDER — HYDROCODONE-ACETAMINOPHEN 5-325 MG PO TABS: 1.0000 | ORAL_TABLET | Freq: Three times a day (TID) | ORAL | Status: DC | PRN

## 2020-05-04 MED ORDER — ONDANSETRON HCL 4 MG PO TABS
4.0000 mg | ORAL_TABLET | ORAL | Status: DC | PRN
  Filled 2020-05-04: qty 1

## 2020-05-04 MED ORDER — DIPHENHYDRAMINE HCL 50 MG/ML IJ SOLN: 12.5000 mg | INTRAMUSCULAR | Status: DC | PRN

## 2020-05-04 MED ORDER — SENNOSIDES-DOCUSATE SODIUM 8.6-50 MG PO TABS
2.0000 | ORAL_TABLET | Freq: Every day | ORAL | Status: DC
  Administered 2020-05-04: 2 via ORAL
  Filled 2020-05-04: qty 2

## 2020-05-04 NOTE — Progress Notes (Signed)
RN at bedside. Pt and spouse both resting. Pt reports feeling sweaty. All VSS. Temperature adjusted. Infant on cooling mat. Will continue to monitor.

## 2020-05-04 NOTE — Anesthesia Preprocedure Evaluation (Addendum)
Anesthesia Evaluation  Patient identified by MRN, date of birth, ID band Patient awake    Reviewed: Allergy & Precautions, H&P , NPO status , Patient's Chart, lab work & pertinent test results  Airway Mallampati: II  TM Distance: >3 FB     Dental   Pulmonary asthma ,           Cardiovascular Exercise Tolerance: Good (-) hypertensionnegative cardio ROS       Neuro/Psych Anxiety    GI/Hepatic GERD  ,  Endo/Other    Renal/GU   negative genitourinary   Musculoskeletal   Abdominal   Peds  Hematology negative hematology ROS (+)   Anesthesia Other Findings IUFD  Past Medical History: No date: Asthma No date: Asthma No date: Narcolepsy No date: Symptomatic PVCs  Past Surgical History: No date: WISDOM TOOTH EXTRACTION  BMI    Body Mass Index: 27.50 kg/m      Reproductive/Obstetrics (+) Pregnancy                           Anesthesia Physical Anesthesia Plan  ASA: II  Anesthesia Plan: Epidural   Post-op Pain Management:    Induction:   PONV Risk Score and Plan:   Airway Management Planned:   Additional Equipment:   Intra-op Plan:   Post-operative Plan:   Informed Consent: I have reviewed the patients History and Physical, chart, labs and discussed the procedure including the risks, benefits and alternatives for the proposed anesthesia with the patient or authorized representative who has indicated his/her understanding and acceptance.       Plan Discussed with: Anesthesiologist  Anesthesia Plan Comments:         Anesthesia Quick Evaluation

## 2020-05-04 NOTE — Discharge Summary (Signed)
Postpartum Discharge Summary    Patient Name: Tanya Glenn DOB: 10-08-1985 MRN: [id number]  Date of admission: 05/03/2020 Delivery date:05/04/2020  Delivering provider: Prentice Docker D  Date of discharge: 05/04/2020  Admitting diagnosis: Fetal demise, greater than 22 weeks, antepartum [O36.4XX0] Intrauterine pregnancy: [redacted]w[redacted]d    Secondary diagnosis:  Principal Problem:   Fetal demise, greater than 22 weeks, antepartum Active Problems:   Narcolepsy   Supervision of high risk pregnancy, antepartum   Encounter for supervision of pregnancy resulting from assisted reproductive technology   Asthma during pregnancy   [redacted] weeks gestation of pregnancy  Additional problems: none    Discharge diagnosis: Preterm Pregnancy Delivered and fetal demise at greater than 22 weeks (28 weeks)                                              Post partum procedures:none Augmentation: AROM and Cytotec Complications: None  Hospital course: Induction of Labor With Vaginal Delivery   35y.o. yo G1P0100 at 29w4das admitted to the hospital 05/03/2020 for induction of labor.  Indication for induction: fetal demise at greater than 22 weeks.  Patient had an uncomplicated labor course as follows: Membrane Rupture Time/Date: 11:31 AM ,05/04/2020   Delivery Method:Vaginal, Spontaneous  Episiotomy: None  Lacerations:  None  Details of delivery can be found in separate delivery note.  Patient had a routine postpartum course. Patient is discharged home 05/05/20.  Newborn Data: Birth date:05/04/2020  Birth time:11:33 AM  Gender:Female  Living status:Fetal Demise  Apgars:0 ,0  Weight:990 g   Magnesium Sulfate received: No BMZ received: No Rhophylac:No MMR:No T-DaP:n/a Flu: No Transfusion:No  Physical exam  Vitals:   05/04/20 1140 05/04/20 1925 05/05/20 0220 05/05/20 0450  BP:  113/72 112/71   Pulse:  81 67   Resp: 16     Temp:  99.1 F (37.3 C) 97.9 F (36.6 C) 97.9 F (36.6 C)  TempSrc:  Oral  Oral Oral  SpO2: 97%     Weight:      Height:       General: alert, cooperative and no distress Lochia: appropriate Uterine Fundus: firm Incision: N/A DVT Evaluation: No evidence of DVT seen on physical exam. No cords or calf tenderness. No significant calf/ankle edema. Labs: Lab Results  Component Value Date   WBC 10.1 05/05/2020   HGB 11.5 (L) 05/05/2020   HCT 33.0 (L) 05/05/2020   MCV 90.9 05/05/2020   PLT 119 (L) 05/05/2020   CMP Latest Ref Rng & Units 04/29/2018  Glucose 70 - 99 mg/dL 105(H)  BUN 6 - 23 mg/dL 19  Creatinine 0.40 - 1.20 mg/dL 1.03  Sodium 135 - 145 mEq/L 136  Potassium 3.5 - 5.1 mEq/L 4.0  Chloride 96 - 112 mEq/L 100  CO2 19 - 32 mEq/L 28  Calcium 8.4 - 10.5 mg/dL 9.5  Total Protein 6.0 - 8.3 g/dL 7.2  Total Bilirubin 0.2 - 1.2 mg/dL 0.4  Alkaline Phos 39 - 117 U/L 40  AST 0 - 37 U/L 22  ALT 0 - 35 U/L 23   Edinburgh Score: No flowsheet data found.    After visit meds:  Allergies as of 05/05/2020      Reactions   Sulfa Antibiotics Hives      Medication List    STOP taking these medications   Accu-Chek Nano SmartView w/Device Kit  Accu-Chek SmartView test strip Generic drug: glucose blood   Accu-Chek Softclix Lancets lancets   Breast Pump Misc     TAKE these medications   acetaminophen 500 MG tablet Commonly known as: TYLENOL Take 2 tablets (1,000 mg total) by mouth every 8 (eight) hours as needed for mild pain.   albuterol 108 (90 Base) MCG/ACT inhaler Commonly known as: VENTOLIN HFA Inhale 1 puff into the lungs every 4 (four) hours as needed for wheezing or shortness of breath.   ibuprofen 600 MG tablet Commonly known as: ADVIL Take 1 tablet (600 mg total) by mouth every 6 (six) hours as needed for mild pain or cramping.   omega-3 acid ethyl esters 1 g capsule Commonly known as: LOVAZA Take by mouth 2 (two) times daily.   Qvar RediHaler 40 MCG/ACT inhaler Generic drug: beclomethasone INHALE 2 PUFFS INTO THE LUNGS TWO  TIMES DAILY            Discharge Care Instructions  (From admission, onward)         Start     Ordered   05/05/20 0000  Discharge wound care:       Comments: Perform wound care instructions   05/05/20 1057           Discharge home in stable condition Infant Disposition:morgue Discharge instruction: per After Visit Summary and Postpartum booklet. Activity: Advance as tolerated. Pelvic rest for 6 weeks.  Diet: routine diet Anticipated Birth Control: Unsure Postpartum Appointment:2 weeks Additional Postpartum F/U: Postpartum Depression checkup Future Appointments: Future Appointments  Date Time Provider [place]  05/16/2020  2:10 PM Will Bonnet, MD [facility] None  11/02/2020  3:30 PM Caryl Bis Angela Adam, MD [id number]   Follow up Visit:  Follow-up Information    Will Bonnet, MD. Schedule an appointment as soon as possible for a visit in 2 week(s).   Specialty: Obstetrics and Gynecology Why: postpartum mood check Contact information: [place] [id number] [place], MD, [place], [facility] 05/05/2020 10:58 AM

## 2020-05-04 NOTE — Anesthesia Procedure Notes (Signed)
Epidural Patient location during procedure: OB Start time: 05/04/2020 9:13 AM End time: 05/04/2020 9:20 AM  Staffing Anesthesiologist: Karleen Hampshire, MD Performed: anesthesiologist   Preanesthetic Checklist Completed: patient identified, IV checked, site marked, risks and benefits discussed, surgical consent, monitors and equipment checked, pre-op evaluation and timeout performed  Epidural Patient position: sitting Prep: ChloraPrep Patient monitoring: heart rate, continuous pulse ox and blood pressure Approach: midline Location: L3-L4 Injection technique: LOR saline  Needle:  Needle type: Tuohy  Needle gauge: 18 G Needle length: 9 cm and 9 Needle insertion depth: 4 cm Catheter type: closed end flexible Catheter size: 20 Guage Catheter at skin depth: 8 cm Test dose: negative and Other  Assessment Events: blood not aspirated, injection not painful, no injection resistance, no paresthesia and negative IV test  Additional Notes Risks and benefits of procedure discussed with patient.  Risks including but not limited to infection, spinal/epidural hematoma, nerve injury, post dural puncture headache, and inadequate/failed block.  Patient expressed understanding and consented to epidural placement. Negative dural puncture.  Negative aspiration.  Negative paresthesia on injection.  Dose given in divided aliquots.  Patient tolerated the procedure well with no immediate complications.   Reason for block:procedure for pain

## 2020-05-05 LAB — CBC
HCT: 33 % — ABNORMAL LOW (ref 36.0–46.0)
Hemoglobin: 11.5 g/dL — ABNORMAL LOW (ref 12.0–15.0)
MCH: 31.7 pg (ref 26.0–34.0)
MCHC: 34.8 g/dL (ref 30.0–36.0)
MCV: 90.9 fL (ref 80.0–100.0)
Platelets: 119 10*3/uL — ABNORMAL LOW (ref 150–400)
RBC: 3.63 MIL/uL — ABNORMAL LOW (ref 3.87–5.11)
RDW: 12.6 % (ref 11.5–15.5)
WBC: 10.1 10*3/uL (ref 4.0–10.5)
nRBC: 0 % (ref 0.0–0.2)

## 2020-05-05 LAB — LUPUS ANTICOAGULANT PANEL
DRVVT: 27.2 s (ref 0.0–47.0)
PTT Lupus Anticoagulant: 27.6 s (ref 0.0–51.9)

## 2020-05-05 LAB — PARVOVIRUS B19 ANTIBODY, IGG AND IGM
Parovirus B19 IgG Abs: 7.1 index — ABNORMAL HIGH (ref 0.0–0.8)
Parovirus B19 IgM Abs: 0.1 index (ref 0.0–0.8)

## 2020-05-05 MED ORDER — IBUPROFEN 600 MG PO TABS: 600.0000 mg | ORAL_TABLET | Freq: Four times a day (QID) | ORAL | 0 refills | Status: DC | PRN

## 2020-05-05 MED ORDER — ACETAMINOPHEN 500 MG PO TABS: 1000.0000 mg | ORAL_TABLET | Freq: Three times a day (TID) | ORAL | Status: DC | PRN

## 2020-05-05 NOTE — Anesthesia Postprocedure Evaluation (Signed)
Anesthesia Post Note  Patient: Tanya Glenn  Procedure(s) Performed: AN AD HOC LABOR EPIDURAL  Patient location during evaluation: Mother Baby Anesthesia Type: Epidural Level of consciousness: awake and alert Pain management: pain level controlled Vital Signs Assessment: post-procedure vital signs reviewed and stable Respiratory status: spontaneous breathing, nonlabored ventilation and respiratory function stable Cardiovascular status: stable Postop Assessment: no headache, no backache and epidural receding Anesthetic complications: no   No complications documented.   Last Vitals:  Vitals:   05/05/20 0220 05/05/20 0450  BP: 112/71   Pulse: 67   Resp:    Temp: 36.6 C 36.6 C  SpO2:      Last Pain:  Vitals:   05/05/20 0450  TempSrc: Oral  PainSc:                  Elmarie Mainland

## 2020-05-06 ENCOUNTER — Ambulatory Visit: Payer: 59

## 2020-05-06 LAB — TORCH-IGM(TOXO/ RUB/ CMV/ HSV) W TITER
CMV IgM: <30 AU/mL (ref 0.0–29.9)
HSVI/II Comb IgM: <0.91 Ratio (ref 0.00–0.90)
Rubella IgM: <20 AU/mL (ref 0.0–19.9)
Toxoplasma Antibody- IgM: <3 AU/mL (ref 0.0–7.9)

## 2020-05-06 LAB — CARDIOLIPIN ANTIBODIES, IGM+IGG
Anticardiolipin IgG: <9 GPL U/mL (ref 0–14)
Anticardiolipin IgM: 14 MPL U/mL — ABNORMAL HIGH (ref 0–12)

## 2020-05-06 LAB — INFECT DISEASE AB IGM REFLEX 1

## 2020-05-10 LAB — FACTOR 5 LEIDEN

## 2020-05-11 LAB — SURGICAL PATHOLOGY

## 2020-05-16 ENCOUNTER — Ambulatory Visit: Payer: 59 | Admitting: Obstetrics and Gynecology

## 2020-05-17 ENCOUNTER — Telehealth: Payer: Self-pay

## 2020-05-17 NOTE — Telephone Encounter (Signed)
FMLA/DISABILITY form for [employer] filled out, signature obtained and given to BS for processing. 

## 2020-05-18 ENCOUNTER — Encounter: Payer: Self-pay | Admitting: Obstetrics and Gynecology

## 2020-05-18 ENCOUNTER — Ambulatory Visit (INDEPENDENT_AMBULATORY_CARE_PROVIDER_SITE_OTHER): Payer: 59 | Admitting: Obstetrics and Gynecology

## 2020-05-18 ENCOUNTER — Other Ambulatory Visit: Payer: Self-pay

## 2020-05-18 ENCOUNTER — Ambulatory Visit: Payer: 59 | Admitting: Obstetrics and Gynecology

## 2020-05-18 VITALS — BP 125/74 | Ht 66.5 in | Wt 158.0 lb

## 2020-05-18 DIAGNOSIS — R300 Dysuria: Secondary | ICD-10-CM

## 2020-05-18 DIAGNOSIS — O364XX0 Maternal care for intrauterine death, not applicable or unspecified: Secondary | ICD-10-CM

## 2020-05-18 NOTE — Progress Notes (Signed)
Obstetrics & Gynecology Office Visit   Chief Complaint  Patient presents with  . Follow-up    History of Present Illness: 35 y.o. G60P0100 female who is 2 weeks postpartum from an induce vaginal delivery after a fetal demise at 28 weeks.  The infant was morphologically normal. There was a true knot in the umbilical cord and a single nuchal cord.  No laboratory findings were indicative of other cause of demise. She did have a minimally elevated anticardiolipin IgM at 14.  The rest of her APLAS workup was negative.  The rest of her fetal demise workup was negative.   She has bladder pain that starts toward the end when voiding and then lingers for few minutes and is even painful for her when she stands.  She notes no blood in her urine.    She continues to have some bleeding, like a light period.  She has no other acute complaints. While this loss has been catastrophic for her and her husband they appear to be grieving appropriately.   Past Medical History:  Diagnosis Date  . Asthma   . Asthma   . Narcolepsy   . Symptomatic PVCs     Past Surgical History:  Procedure Laterality Date  . WISDOM TOOTH EXTRACTION      Gynecologic History: No LMP recorded.  Obstetric History: G1P0100  Family History  Problem Relation Age of Onset  . Heart disease Maternal Grandmother   . Stroke Maternal Grandmother   . Hyperlipidemia Father     Social History   Socioeconomic History  . Marital status: Married    Spouse name: Trish Mage  . Number of children: Not on file  . Years of education: Not on file  . Highest education level: Not on file  Occupational History  . Occupation: Pharmacist  Tobacco Use  . Smoking status: Never Smoker  . Smokeless tobacco: Never Used  Vaping Use  . Vaping Use: Never used  Substance and Sexual Activity  . Alcohol use: Yes    Comment: occasional drink on the weekend.  . Drug use: No  . Sexual activity: Yes    Birth control/protection: None   Other Topics Concern  . Not on file  Social History Narrative   Lives in [place].  Exercises - wt training - 4+ x /wk.     Social Determinants of Health   Financial Resource Strain: Not on file  Food Insecurity: Not on file  Transportation Needs: Not on file  Physical Activity: Not on file  Stress: Not on file  Social Connections: Not on file  Intimate Partner Violence: Not on file    Allergies  Allergen Reactions  . Sulfa Antibiotics Hives    Prior to Admission medications   Medication Sig Start Date End Date Taking? Authorizing Provider  acetaminophen (TYLENOL) 500 MG tablet Take 2 tablets (1,000 mg total) by mouth every 8 (eight) hours as needed for mild pain. 05/05/20  Yes Conard Novak, MD  albuterol (VENTOLIN HFA) 108 (90 Base) MCG/ACT inhaler Inhale 1 puff into the lungs every 4 (four) hours as needed for wheezing or shortness of breath. 07/27/19  Yes Glori Luis, MD  ibuprofen (ADVIL) 600 MG tablet Take 1 tablet (600 mg total) by mouth every 6 (six) hours as needed for mild pain or cramping. 05/05/20  Yes Conard Novak, MD  QVAR REDIHALER 40 MCG/ACT inhaler INHALE 2 PUFFS INTO THE LUNGS TWO TIMES DAILY 04/12/20  Yes Glori Luis, MD  omega-3 acid  ethyl esters (LOVAZA) 1 g capsule Take by mouth 2 (two) times daily.    [provider]    Review of Systems  Constitutional: Negative.   HENT: Negative.   Eyes: Negative.   Respiratory: Negative.   Cardiovascular: Negative.   Gastrointestinal: Negative.   Genitourinary: Negative.   Musculoskeletal: Negative.   Skin: Negative.   Neurological: Negative.   Psychiatric/Behavioral: Negative.      Physical Exam BP 125/74   Ht 5' 6.5" (1.689 m)   Wt 158 lb (71.7 kg)   BMI 25.12 kg/m  No LMP recorded. Physical Exam Constitutional:      General: She is not in acute distress.    Appearance: Normal appearance.  HENT:     Head: Normocephalic and atraumatic.  Eyes:     General: No scleral  icterus.    Conjunctiva/sclera: Conjunctivae normal.  Abdominal:     Tenderness: There is no abdominal tenderness. There is no guarding or rebound.  Neurological:     General: No focal deficit present.     Mental Status: She is alert and oriented to person, place, and time.     Cranial Nerves: No cranial nerve deficit.  Psychiatric:        Mood and Affect: Mood normal.        Behavior: Behavior normal.        Judgment: Judgment normal.     Female chaperone present for pelvic and breast  portions of the physical exam  Assessment: 35 y.o. G85P0100 female here for  1. Fetal demise, greater than 22 weeks, antepartum, single or unspecified fetus   2. Dysuria      Plan: Problem List Items Addressed This Visit      Other   Fetal demise, greater than 22 weeks, antepartum - Primary   Relevant Orders   Cardiolipin antibodies, IgG, IgM, IgA    Other Visit Diagnoses    Dysuria       Relevant Orders   Urine Culture     We spent a while discussing the findings from our assessment and the course of her pregnancy in an attempt to identify any possible etiology to the fetal demise, apart from the nuchal cord and true knot. Nothing is otherwise obvious with a normal anatomy scan and negative fetal echo during her pregnancy. One discrepancy was that the fetal umbilical cord was a 2-vessel cord and this was reported as a 3-vessel cord during her ultrasound. I reviewed her ultrasound and, based on the image given in the ultrasound, it is difficult to say that the cord had 2 vessels.  She and her husband even shared that during the fetal echo that the sonographer had trouble saying for sure whether the cord had 2 or 3 vessels. We did discuss that this was very unlikely to play a significant role in the events that occurred.   We discussed moving forward and the related timing. We discussed that the textbook recommended timing would be 18-22 months. However, a shorter period of time would likely make  little difference unless the timing were significantly shorter (like 3 months). I discussed that my opinion was purely from a pregnancy standpoint and that I could not with any accuracy state the optimal waiting period of attempting another embryo transfer.  I recommended she discuss this with her REI, Dr. April Manson.    We discussed grieving and postpartum depression and symptoms of which to be aware. Will have her follow up as needed or in 1 year for  routine care.   Thomasene Mohair, MD 05/18/2020 4:34 PM

## 2020-05-19 ENCOUNTER — Encounter: Payer: Self-pay | Admitting: Obstetrics and Gynecology

## 2020-05-19 DIAGNOSIS — R300 Dysuria: Secondary | ICD-10-CM | POA: Diagnosis not present

## 2020-05-21 LAB — URINE CULTURE

## 2020-05-30 NOTE — Telephone Encounter (Signed)
Updated FMLA forms, pt was not ready to return to work today. Going back 4/26. Given 2 more weeks to heal from loss of infant

## 2020-07-15 ENCOUNTER — Other Ambulatory Visit: Payer: Self-pay | Admitting: Obstetrics and Gynecology

## 2020-07-15 ENCOUNTER — Telehealth: Payer: Self-pay | Admitting: Obstetrics and Gynecology

## 2020-07-15 DIAGNOSIS — N914 Secondary oligomenorrhea: Secondary | ICD-10-CM

## 2020-07-15 DIAGNOSIS — O364XX0 Maternal care for intrauterine death, not applicable or unspecified: Secondary | ICD-10-CM

## 2020-07-15 MED ORDER — MEDROXYPROGESTERONE ACETATE 10 MG PO TABS: 10.0000 mg | ORAL_TABLET | Freq: Every day | ORAL | 0 refills | Status: DC

## 2020-07-15 NOTE — Telephone Encounter (Signed)
Discussed concerns of amenorrhea since delivery.  We will send in Provera challenge.  If negative (no withdrawal bleed), will need to investigate further.  Orders placed for lab draw in June to repeat antiphospholipid antibody syndrome assessment.

## 2020-07-29 ENCOUNTER — Other Ambulatory Visit: Payer: 59

## 2020-07-29 ENCOUNTER — Other Ambulatory Visit: Payer: Self-pay

## 2020-07-29 DIAGNOSIS — O364XX0 Maternal care for intrauterine death, not applicable or unspecified: Secondary | ICD-10-CM

## 2020-07-31 LAB — CARDIOLIPIN ANTIBODIES, IGG, IGM, IGA
Anticardiolipin IgA: <9 APL U/mL (ref 0–11)
Anticardiolipin IgG: <9 GPL U/mL (ref 0–14)
Anticardiolipin IgM: 23 MPL U/mL — ABNORMAL HIGH (ref 0–12)

## 2020-07-31 LAB — LUPUS ANTICOAGULANT
Dilute Viper Venom Time: 31 s (ref 0.0–47.0)
PTT Lupus Anticoagulant: 33.3 s (ref 0.0–51.9)
Thrombin Time: 19.9 s (ref 0.0–23.0)
dPT Confirm Ratio: 0.91 Ratio (ref 0.00–1.34)
dPT: 32.2 s (ref 0.0–47.6)

## 2020-07-31 LAB — BETA-2-GLYCOPROTEIN I ABS, IGG/M/A
Beta-2 Glyco 1 IgA: <9 GPI IgA units (ref 0–25)
Beta-2 Glyco 1 IgM: <9 GPI IgM units (ref 0–32)
Beta-2 Glyco I IgG: <9 GPI IgG units (ref 0–20)

## 2020-08-02 DIAGNOSIS — Z113 Encounter for screening for infections with a predominantly sexual mode of transmission: Secondary | ICD-10-CM | POA: Diagnosis not present

## 2020-08-02 DIAGNOSIS — Z319 Encounter for procreative management, unspecified: Secondary | ICD-10-CM | POA: Diagnosis not present

## 2020-08-02 DIAGNOSIS — Z3183 Encounter for assisted reproductive fertility procedure cycle: Secondary | ICD-10-CM | POA: Diagnosis not present

## 2020-08-02 DIAGNOSIS — E2839 Other primary ovarian failure: Secondary | ICD-10-CM | POA: Diagnosis not present

## 2020-08-02 DIAGNOSIS — Z3141 Encounter for fertility testing: Secondary | ICD-10-CM | POA: Diagnosis not present

## 2020-08-02 DIAGNOSIS — Z3162 Encounter for fertility preservation counseling: Secondary | ICD-10-CM | POA: Diagnosis not present

## 2020-08-02 DIAGNOSIS — N979 Female infertility, unspecified: Secondary | ICD-10-CM | POA: Diagnosis not present

## 2020-08-02 DIAGNOSIS — N858 Other specified noninflammatory disorders of uterus: Secondary | ICD-10-CM | POA: Diagnosis not present

## 2020-08-03 ENCOUNTER — Other Ambulatory Visit: Payer: Self-pay

## 2020-08-03 MED ORDER — DOXYCYCLINE HYCLATE 100 MG PO TABS
ORAL_TABLET | ORAL | 0 refills | Status: DC
  Filled 2020-08-03: qty 10, 5d supply, fill #0

## 2020-08-08 ENCOUNTER — Other Ambulatory Visit
Admission: RE | Admit: 2020-08-08 | Discharge: 2020-08-08 | Disposition: A | Discharge status: Home / Self Care | Payer: 59 | Attending: Obstetrics and Gynecology | Admitting: Obstetrics and Gynecology

## 2020-08-08 ENCOUNTER — Other Ambulatory Visit: Payer: Self-pay

## 2020-08-08 DIAGNOSIS — R7989 Other specified abnormal findings of blood chemistry: Secondary | ICD-10-CM | POA: Diagnosis not present

## 2020-08-08 LAB — T4, FREE: Free T4: 1.62 ng/dL — ABNORMAL HIGH (ref 0.61–1.12)

## 2020-08-08 MED ORDER — MENOPUR 75 UNITS ~~LOC~~ SOLR
SUBCUTANEOUS | 1 refills | Status: DC
  Filled 2020-08-08: qty 10, 10d supply, fill #0

## 2020-08-08 MED ORDER — "BD DISP NEEDLES 30G X 1/2"" MISC"
2 refills | Status: DC
  Filled 2020-08-08: qty 15, 15d supply, fill #0
  Filled 2020-11-04: qty 60, 30d supply, fill #1
  Filled 2020-11-28: qty 60, 30d supply, fill #2

## 2020-08-08 MED ORDER — "BD LUER-LOK SYRINGE 18G X 1-1/2"" 3 ML MISC"
2 refills | Status: DC
  Filled 2020-08-08: qty 30, 10d supply, fill #0
  Filled 2020-08-29 – 2020-11-28 (×2): qty 30, 10d supply, fill #1

## 2020-08-08 MED ORDER — METHYLPREDNISOLONE 4 MG PO TABS
ORAL_TABLET | ORAL | 0 refills | Status: DC
  Filled 2020-08-08: qty 16, 4d supply, fill #0

## 2020-08-08 MED ORDER — GONAL-F 1050 UNITS IJ SOLR
INTRAMUSCULAR | 1 refills | Status: DC
  Filled 2020-08-08: qty 3, 9d supply, fill #0

## 2020-08-08 MED ORDER — CETROTIDE 0.25 MG ~~LOC~~ KIT
PACK | SUBCUTANEOUS | 1 refills | Status: DC
Start: 2020-08-04 — End: 2020-11-02
  Filled 2020-08-08: qty 5, 5d supply, fill #0
  Filled 2020-08-29: qty 5, 5d supply, fill #1

## 2020-08-08 MED ORDER — PROGESTERONE 50 MG/ML IM OIL
TOPICAL_OIL | INTRAMUSCULAR | 2 refills | Status: DC
  Filled 2020-08-08: qty 30, 30d supply, fill #0
  Filled 2020-08-29: qty 30, 30d supply, fill #1

## 2020-08-08 MED ORDER — "BD DISP NEEDLES 22G X 1-1/2"" MISC"
2 refills | Status: DC
  Filled 2020-08-08: qty 30, 15d supply, fill #0
  Filled 2020-08-29: qty 30, 15d supply, fill #1

## 2020-08-08 MED ORDER — LEVOTHYROXINE SODIUM 25 MCG PO TABS
ORAL_TABLET | ORAL | 6 refills | Status: DC
  Filled 2020-08-08: qty 30, 30d supply, fill #0

## 2020-08-08 MED ORDER — DOXYCYCLINE HYCLATE 100 MG PO TABS
ORAL_TABLET | ORAL | 0 refills | Status: DC
  Filled 2020-08-08: qty 40, 20d supply, fill #0

## 2020-08-08 MED ORDER — ESTRADIOL 0.1 MG/24HR TD PTTW
MEDICATED_PATCH | TRANSDERMAL | 3 refills | Status: DC
  Filled 2020-08-08: qty 8, 30d supply, fill #0
  Filled 2020-08-29 – 2020-09-19 (×2): qty 8, 30d supply, fill #1

## 2020-08-08 MED ORDER — CHORIONIC GONADOTROPIN 10000 UNITS IM SOLR
INTRAMUSCULAR | 0 refills | Status: DC
  Filled 2020-08-08 – 2020-08-09 (×3): qty 1, 1d supply, fill #0

## 2020-08-09 ENCOUNTER — Other Ambulatory Visit: Payer: Self-pay

## 2020-08-09 ENCOUNTER — Telehealth: Payer: Self-pay

## 2020-08-09 ENCOUNTER — Encounter: Payer: Self-pay | Admitting: Family Medicine

## 2020-08-09 DIAGNOSIS — E0590 Thyrotoxicosis, unspecified without thyrotoxic crisis or storm: Secondary | ICD-10-CM

## 2020-08-09 LAB — T3, FREE: T3, Free: 5.1 pg/mL — ABNORMAL HIGH (ref 2.0–4.4)

## 2020-08-09 NOTE — Telephone Encounter (Signed)
Pt called and wanted appt with Dr Birdie Sons to discuss blood work that was done by [facility]. She states that she needs to speak with him about how to fix her labs. No appts avail with provider at the time of call-for any time this week. Pt wants a call back today. She states that not all of her labs are in Epic and she wants him to help her get her labs within normal limits ASAP and is disappointed that she can not see her PCP when needed. Pt denies any urgent symptoms.

## 2020-08-10 ENCOUNTER — Other Ambulatory Visit: Payer: Self-pay

## 2020-08-10 NOTE — Telephone Encounter (Signed)
I just now saw this as it was not sent to me until close to 5 yesterday. She sent a mychart message as well that I reviewed. Can she come in to be seen at 4:15 on Friday?

## 2020-08-10 NOTE — Telephone Encounter (Signed)
Appointment has been scheduled and she will be coming in office at 1615 this Friday.

## 2020-08-12 ENCOUNTER — Other Ambulatory Visit: Payer: Self-pay

## 2020-08-12 ENCOUNTER — Ambulatory Visit: Payer: 59 | Admitting: Family Medicine

## 2020-08-12 VITALS — BP 118/70 | HR 87 | Temp 98.0°F | Ht 66.0 in | Wt 154.2 lb

## 2020-08-12 DIAGNOSIS — Z113 Encounter for screening for infections with a predominantly sexual mode of transmission: Secondary | ICD-10-CM | POA: Diagnosis not present

## 2020-08-12 DIAGNOSIS — N979 Female infertility, unspecified: Secondary | ICD-10-CM | POA: Diagnosis not present

## 2020-08-12 DIAGNOSIS — R7989 Other specified abnormal findings of blood chemistry: Secondary | ICD-10-CM

## 2020-08-12 DIAGNOSIS — D696 Thrombocytopenia, unspecified: Secondary | ICD-10-CM | POA: Diagnosis not present

## 2020-08-12 DIAGNOSIS — Z319 Encounter for procreative management, unspecified: Secondary | ICD-10-CM | POA: Diagnosis not present

## 2020-08-12 DIAGNOSIS — Z3183 Encounter for assisted reproductive fertility procedure cycle: Secondary | ICD-10-CM | POA: Diagnosis not present

## 2020-08-12 DIAGNOSIS — R760 Raised antibody titer: Secondary | ICD-10-CM

## 2020-08-12 DIAGNOSIS — O905 Postpartum thyroiditis: Secondary | ICD-10-CM

## 2020-08-12 LAB — CBC
HCT: 38.1 % (ref 35.0–45.0)
Hemoglobin: 12.8 g/dL (ref 11.7–15.5)
MCH: 30.7 pg (ref 27.0–33.0)
MCHC: 33.6 g/dL (ref 32.0–36.0)
MCV: 91.4 fL (ref 80.0–100.0)
MPV: 13.4 fL — ABNORMAL HIGH (ref 7.5–12.5)
Platelets: 174 10*3/uL (ref 140–400)
RBC: 4.17 10*6/uL (ref 3.80–5.10)
RDW: 12.3 % (ref 11.0–15.0)
WBC: 7.1 10*3/uL (ref 3.8–10.8)

## 2020-08-12 NOTE — Progress Notes (Signed)
Tommi Rumps, MD Phone: [phone]  Tanya Glenn is a 35 y.o. female who presents today for follow-up.  Abnormal thyroid function testing: The patient had a fetal demise in March.  In the process of working this up after delivery her gynecologist identified a low TSH with elevated free T4 and free T3 as well as a high TPO antibody titer.  The patient notes no thyroid tenderness.  No thyroid pain.  She does note some loose stools, palpitations, anxiety, trouble sleeping, and tremor.  She notes some weight loss over the last week or so.  She does have some depression and anxiety though does not want medication for this.  She notes no SI.  She wants to know what can be done to resolve the thyroid function issues as she desires pregnancy again in the near future.  The patient also had a mildly elevated anticardiolipin IgM.  Social History   Tobacco Use  Smoking Status Never  Smokeless Tobacco Never    Current Outpatient Medications on File Prior to Visit  Medication Sig Dispense Refill   acetaminophen (TYLENOL) 500 MG tablet Take 2 tablets (1,000 mg total) by mouth every 8 (eight) hours as needed for mild pain.     beclomethasone (QVAR) 40 MCG/ACT inhaler INHALE 2 PUFFS INTO THE LUNGS TWO TIMES DAILY 10.6 g 2   Cetrorelix Acetate (CETROTIDE) 0.25 MG KIT reconstitute and inject 1 syringe daily 5 kit 1   COVID-19 mRNA Vac-TriS, Pfizer, SUSP injection USE AS DIRECTED .3 mL 0   doxycycline (VIBRA-TABS) 100 MG tablet take one tablet by mouth twice a day for 5 days 10 tablet 0   doxycycline (VIBRA-TABS) 100 MG tablet Take 1 tablet by mouth twice a day 40 tablet 0   estradiol (VIVELLE-DOT) 0.1 MG/24HR patch APPLY 2 PATCHES TO SKIN EVERY 3 DAYS 16 patch 2   estradiol (VIVELLE-DOT) 0.1 MG/24HR patch apply one patch onto the skin every 3 days. 8 patch 3   Follitropin Alfa (GONAL-F) 1050 units SOLR inject 300 units under the skin daily or as directed 3 each 1   human chorionic gonadotropin  (PREGNYL/NOVAREL) 10000 units injection mix with 1cc diluent and inject at exact time given as directed 1 each 0   ibuprofen (ADVIL) 600 MG tablet Take 1 tablet (600 mg total) by mouth every 6 (six) hours as needed for mild pain or cramping. 30 tablet 0   Menotropins (MENOPUR) 75 units SOLR inejct 75 units daily under the skin or as directed 10 each 1   NEEDLE, DISP, 22 G (BD DISP NEEDLES) 22G X 1-1/2" MISC use as directed 30 each 2   NEEDLE, DISP, 30 G (BD DISP NEEDLES) 30G X 1/2" MISC use as directed 60 each 2   progesterone 50 MG/ML injection Inject 1cc into the muscle daily 30 mL 2   SYRINGE-NEEDLE, DISP, 3 ML (B-D 3CC LUER-LOK SYR 18GX1-1/2) 18G X 1-1/2" 3 ML MISC use as directed 60 each 2   albuterol (VENTOLIN HFA) 108 (90 Base) MCG/ACT inhaler INHALE 1 PUFF BY MOUTH EVERY 4 HOURS AS NEEDED FOR WHEEZING OR SHORTNESS OF BREATH. 18 g 2   levothyroxine (SYNTHROID) 25 MCG tablet 1 PO QD Monday-Friday (Patient not taking: Reported on 08/12/2020) 30 tablet 6   medroxyPROGESTERone (PROVERA) 10 MG tablet Take 1 tablet (10 mg total) by mouth daily for 10 days. 10 tablet 0   methylPREDNISolone (MEDROL) 4 MG tablet take 2 tablets (87m) by mouth twice a day (Patient not taking: Reported on 08/12/2020) 16  tablet 0   omega-3 acid ethyl esters (LOVAZA) 1 g capsule Take by mouth 2 (two) times daily. (Patient not taking: Reported on 08/12/2020)     No current facility-administered medications on file prior to visit.     ROS see history of present illness  Objective  Physical Exam Vitals:   08/12/20 1618  BP: 118/70  Pulse: 87  Temp: 98 F (36.7 C)  SpO2: 99%    BP Readings from Last 3 Encounters:  08/12/20 118/70  05/18/20 125/74  05/05/20 121/79   Wt Readings from Last 3 Encounters:  08/12/20 154 lb 3.2 oz (69.9 kg)  05/18/20 158 lb (71.7 kg)  05/03/20 173 lb (78.5 kg)    Physical Exam Constitutional:      General: She is not in acute distress.    Appearance: She is not diaphoretic.   Neck:     Comments: Slight thyroid enlargement on exam, no tenderness Cardiovascular:     Rate and Rhythm: Normal rate and regular rhythm.     Heart sounds: Normal heart sounds.  Pulmonary:     Effort: Pulmonary effort is normal.     Breath sounds: Normal breath sounds.  Skin:    General: Skin is warm and dry.  Neurological:     Mental Status: She is alert.     Assessment/Plan: Please see individual problem list.  Problem List Items Addressed This Visit     Anticardiolipin antibody positive    The patient had a low positive result.  This is not considered clinically significant until the titer is greater than 40 units based on review of up-to-date.       Low TSH level - Primary    Lab findings are concerning for postpartum thyroiditis versus acute thyroiditis versus Graves' disease.  Discussed possible diagnosis of postpartum thyroiditis.  This would not seem consistent with acute thyroiditis given lack of pain and tenderness.  I discussed that typical treatment is symptomatic control with beta-blockers and then eventually levothyroxine if she switches to a hypothyroid state.  I advised I would message one of our endocrinology colleagues to get their input on potential work-up for this.  Discussed that she would need to see endocrinology as well.       Relevant Orders   TSH   T4, free   T3, free   Thrombocytopenia ([place])    Noted on prior labs.  We will repeat today.       Relevant Orders   CBC (Completed)   Return in about 4 weeks (around 09/09/2020) for thyroid function labs, 8 weeks PCP.  This visit occurred during the SARS-CoV-2 public health emergency.  Safety protocols were in place, including screening questions prior to the visit, additional usage of staff PPE, and extensive cleaning of exam room while observing appropriate contact time as indicated for disinfecting solutions.   On the day of this visit I spent 34 minutes in the care of this patient regarding  review of the patient's MyChart message with lab information, review of postpartum thyroiditis to aid in discussion of diagnosis and plan, completion of exam, history taking, discussion of possible diagnoses and plan, and communication with endocrinology.   Tommi Rumps, MD [place]

## 2020-08-12 NOTE — Patient Instructions (Signed)
Nice to see you. I will reach out to one of our endocrinologist and see what they say regarding your lab results and symptoms. We will contact you with your CBC results.

## 2020-08-15 ENCOUNTER — Telehealth: Payer: Self-pay | Admitting: Family Medicine

## 2020-08-15 DIAGNOSIS — D696 Thrombocytopenia, unspecified: Secondary | ICD-10-CM | POA: Insufficient documentation

## 2020-08-15 DIAGNOSIS — R7989 Other specified abnormal findings of blood chemistry: Secondary | ICD-10-CM

## 2020-08-15 DIAGNOSIS — R760 Raised antibody titer: Secondary | ICD-10-CM | POA: Insufficient documentation

## 2020-08-15 NOTE — Telephone Encounter (Signed)
Please let the patient know that I heard back from endocrinology.  They noted it was difficult to tell if this is postpartum thyroiditis particularly given that there were no thyroid function tests from when she was pregnant though they did note this was a possible diagnosis.  They recommended making sure that the patient's lab abnormalities were not related to Graves' disease and recommended a thyroid-stimulating immunoglobulin test and then potentially a thyroid uptake scan.  We can arrange for the thyroid-stimulating immunoglobulin blood test.  I would suggest that she see endocrinology and I can refer her to our endocrinologist in [facility] if she is willing.  They also noted that if it is postpartum thyroiditis there typically is not treatment for this though if she has Graves' disease there is treatment that they would start her on if her thyroid function test remain abnormal.  Please also let the patient know that her anticardiolipin antibody was in the low positive range.  This is not diagnostic of antiphospholipid syndrome until the titer is above 40.  This certainly could be rechecked in the future to see if her titer levels have changed.

## 2020-08-15 NOTE — Telephone Encounter (Signed)
-----   Message from Carlus Pavlov, MD sent at 08/14/2020  5:14 PM EDT ----- Regarding: RE: Question about possible postpartum thyroiditis Hi Minerva Areola, It is difficult to tell whether this is postpartum thyroiditis or not, but if she had normal TFTs during the pregnancy, then, yes, this condition is most likely. However, i would make sure this is not Graves ds. - check a TSI titer first, and, if normal, do an uptake and scan, as it can still be mild Graves in that case. Regarding treatment, you are right, not much to do if it is pospartum thyr. For Graves, we can use a low dose PTU if the tests remain abnormal (and not Methimazole if the plan is to get pregnant very soon again). Hope this helps! Silvestre Mesi  ----- Message ----- From: Glori Luis, MD Sent: 08/12/2020   5:18 PM EDT To: Carlus Pavlov, MD Subject: Question about possible postpartum thyroidit#  Hi Dr Elvera Lennox,  I wanted to see if you could help me with this patient. She had a fetal demise and delivery at [redacted]w[redacted]d on 05/04/20. In the process of a large work up for the fetal demise she had lab work drawn through her GYN/fertility specialist as outlined:  08/03/20: Thyroid Peroxidase (TPO) Ab: 257 (HIGH) 08/03/20 TSH: 0.252 (low) 08/08/20: Free T4: 1.62 (high) 08/08/20: Free T3: 5.1 (high)  She reports some issues with anxiety, palpitations, loose stools, and some recent weight loss. She denies any tremor and notes no pain or tenderness of her thyroid. She has noted slight thyroid enlargement since learning of the test results.   Based on my review and her history it would seem that this could be related to postpartum thyroiditis as opposed to a subacute thyroiditis.   The patient asked about treatment options as she is eager to have this resolved as quickly as possible as she would like to become pregnant again in the near future. Based on my review it appears treatment is typically aimed at reducing symptoms of hyperthyroidism with beta  blockers and treating with synthroid if patients become symptomatic or have a TSH >10. Up-to-date did not seem to indicate that there was a treatment that would reverse the course of the thyroiditis. It also seemed that postpartum thyroiditis essentially just has to run its course.   Questions: 1. I wanted to see if you agreed that this was likely postpartum thyroiditis and if not what might be the cause? 2. Are there any treatments other than symptomatic management? 3. What timeline would you suggest for rechecking her labs?  If you need additional information please let me know.   I can also plan on getting her referred to you all.   Thank you for your help.  Minerva Areola

## 2020-08-15 NOTE — Assessment & Plan Note (Signed)
Lab findings are concerning for postpartum thyroiditis versus acute thyroiditis versus Graves' disease.  Discussed possible diagnosis of postpartum thyroiditis.  This would not seem consistent with acute thyroiditis given lack of pain and tenderness.  I discussed that typical treatment is symptomatic control with beta-blockers and then eventually levothyroxine if she switches to a hypothyroid state.  I advised I would message one of our endocrinology colleagues to get their input on potential work-up for this.  Discussed that she would need to see endocrinology as well.

## 2020-08-15 NOTE — Assessment & Plan Note (Signed)
The patient had a low positive result.  This is not considered clinically significant until the titer is greater than 40 units based on review of up-to-date.

## 2020-08-15 NOTE — Assessment & Plan Note (Signed)
Noted on prior labs.  We will repeat today.

## 2020-08-16 NOTE — Telephone Encounter (Signed)
This patient needs labs done ASAP.  An order for a thyroid-stimulating immunoglobulin was placed previously and this is the test that needs to be done as soon as possible.  She does not need the TSH or T3/T4 until July as scheduled.

## 2020-08-16 NOTE — Telephone Encounter (Signed)
I placed orders for this patient have the thyroid-stimulating immunoglobulin.  This needs to be done as soon as possible.  Please get her scheduled for that lab only.  Please also document your notes under the documentation tab as the routing comments do not save to the chart.  Thanks.

## 2020-08-17 ENCOUNTER — Other Ambulatory Visit (INDEPENDENT_AMBULATORY_CARE_PROVIDER_SITE_OTHER): Payer: 59

## 2020-08-17 ENCOUNTER — Other Ambulatory Visit: Payer: Self-pay

## 2020-08-17 DIAGNOSIS — Z113 Encounter for screening for infections with a predominantly sexual mode of transmission: Secondary | ICD-10-CM | POA: Diagnosis not present

## 2020-08-17 DIAGNOSIS — N979 Female infertility, unspecified: Secondary | ICD-10-CM | POA: Diagnosis not present

## 2020-08-17 DIAGNOSIS — Z319 Encounter for procreative management, unspecified: Secondary | ICD-10-CM | POA: Diagnosis not present

## 2020-08-17 DIAGNOSIS — R7989 Other specified abnormal findings of blood chemistry: Secondary | ICD-10-CM

## 2020-08-17 DIAGNOSIS — Z3183 Encounter for assisted reproductive fertility procedure cycle: Secondary | ICD-10-CM | POA: Diagnosis not present

## 2020-08-17 DIAGNOSIS — E2839 Other primary ovarian failure: Secondary | ICD-10-CM | POA: Diagnosis not present

## 2020-08-17 NOTE — Telephone Encounter (Signed)
I called the patient and spoke with her and inforemd her of what enod suggested and she is willing to do the lab test and go to [facility] for the scan and she understood everything I read to her with no questions.  Carney Saxton,cma

## 2020-08-17 NOTE — Telephone Encounter (Signed)
Noted. Referral has been placed.

## 2020-08-17 NOTE — Addendum Note (Signed)
Addended by: Warden Fillers on: 08/17/2020 03:46 PM   Modules accepted: Orders

## 2020-08-18 ENCOUNTER — Encounter: Payer: Self-pay | Admitting: Family Medicine

## 2020-08-18 LAB — THYROID STIMULATING IMMUNOGLOBULIN: Thyroid Stim Immunoglobulin: <0.1 IU/L (ref 0.00–0.55)

## 2020-08-19 DIAGNOSIS — Z113 Encounter for screening for infections with a predominantly sexual mode of transmission: Secondary | ICD-10-CM | POA: Diagnosis not present

## 2020-08-19 DIAGNOSIS — N979 Female infertility, unspecified: Secondary | ICD-10-CM | POA: Diagnosis not present

## 2020-08-19 DIAGNOSIS — Z3183 Encounter for assisted reproductive fertility procedure cycle: Secondary | ICD-10-CM | POA: Diagnosis not present

## 2020-08-22 DIAGNOSIS — E2839 Other primary ovarian failure: Secondary | ICD-10-CM | POA: Diagnosis not present

## 2020-08-22 DIAGNOSIS — N979 Female infertility, unspecified: Secondary | ICD-10-CM | POA: Diagnosis not present

## 2020-08-22 DIAGNOSIS — Z113 Encounter for screening for infections with a predominantly sexual mode of transmission: Secondary | ICD-10-CM | POA: Diagnosis not present

## 2020-08-22 DIAGNOSIS — Z3183 Encounter for assisted reproductive fertility procedure cycle: Secondary | ICD-10-CM | POA: Diagnosis not present

## 2020-08-22 DIAGNOSIS — Z319 Encounter for procreative management, unspecified: Secondary | ICD-10-CM | POA: Diagnosis not present

## 2020-08-24 DIAGNOSIS — Z113 Encounter for screening for infections with a predominantly sexual mode of transmission: Secondary | ICD-10-CM | POA: Diagnosis not present

## 2020-08-24 DIAGNOSIS — Z319 Encounter for procreative management, unspecified: Secondary | ICD-10-CM | POA: Diagnosis not present

## 2020-08-24 DIAGNOSIS — N979 Female infertility, unspecified: Secondary | ICD-10-CM | POA: Diagnosis not present

## 2020-08-24 DIAGNOSIS — E2839 Other primary ovarian failure: Secondary | ICD-10-CM | POA: Diagnosis not present

## 2020-08-24 DIAGNOSIS — Z3183 Encounter for assisted reproductive fertility procedure cycle: Secondary | ICD-10-CM | POA: Diagnosis not present

## 2020-08-26 DIAGNOSIS — Z3183 Encounter for assisted reproductive fertility procedure cycle: Secondary | ICD-10-CM | POA: Diagnosis not present

## 2020-08-26 DIAGNOSIS — N979 Female infertility, unspecified: Secondary | ICD-10-CM | POA: Diagnosis not present

## 2020-08-29 ENCOUNTER — Other Ambulatory Visit: Payer: Self-pay

## 2020-08-29 MED ORDER — PROGESTERONE 50 MG/ML IM OIL
TOPICAL_OIL | INTRAMUSCULAR | 2 refills | Status: DC
  Filled 2020-08-29: qty 30, 30d supply, fill #0
  Filled ????-??-??: fill #0

## 2020-08-29 MED ORDER — CETROTIDE 0.25 MG ~~LOC~~ KIT
PACK | SUBCUTANEOUS | 1 refills | Status: DC
  Filled 2020-09-01: qty 5, 5d supply, fill #0

## 2020-08-29 MED ORDER — ESTRADIOL 0.1 MG/24HR TD PTTW
MEDICATED_PATCH | TRANSDERMAL | 3 refills | Status: DC
  Filled 2020-08-29: qty 8, 28d supply, fill #0
  Filled ????-??-??: fill #0

## 2020-08-29 MED ORDER — GONAL-F RFF REDIJECT 450 UNT/0.75ML ~~LOC~~ SOPN
PEN_INJECTOR | SUBCUTANEOUS | 1 refills | Status: DC
  Filled 2020-08-29 (×2): qty 2.25, 3d supply, fill #0
  Filled 2020-09-01: qty 2.25, 14d supply, fill #0

## 2020-08-29 MED ORDER — OVIDREL 250 MCG/0.5ML ~~LOC~~ INJ
INJECTION | SUBCUTANEOUS | 2 refills | Status: DC
  Filled 2020-08-29 – 2020-09-01 (×2): qty 0.5, 1d supply, fill #0

## 2020-08-30 ENCOUNTER — Other Ambulatory Visit: Payer: Self-pay

## 2020-08-30 DIAGNOSIS — U071 COVID-19: Secondary | ICD-10-CM | POA: Insufficient documentation

## 2020-08-30 DIAGNOSIS — E079 Disorder of thyroid, unspecified: Secondary | ICD-10-CM | POA: Insufficient documentation

## 2020-08-30 MED ORDER — PENTOXIFYLLINE ER 400 MG PO TBCR
EXTENDED_RELEASE_TABLET | ORAL | 1 refills | Status: DC
  Filled 2020-08-30: qty 90, 30d supply, fill #0
  Filled 2020-10-05: qty 45, 15d supply, fill #1

## 2020-08-30 MED ORDER — PAXLOVID 20 X 150 MG & 10 X 100MG PO TBPK
ORAL_TABLET | ORAL | 0 refills | Status: DC
  Filled 2020-08-30: qty 30, 5d supply, fill #0

## 2020-08-30 MED ORDER — VITAMIN E 180 MG (400 UNIT) PO CAPS: ORAL_CAPSULE | Freq: Every day | ORAL | 6 refills | Status: DC

## 2020-08-30 MED ORDER — METHYLPREDNISOLONE 4 MG PO TABS
8.0000 mg | ORAL_TABLET | Freq: Two times a day (BID) | ORAL | 0 refills | Status: DC
  Filled 2020-08-30 – 2020-09-01 (×2): qty 16, 4d supply, fill #0

## 2020-08-31 ENCOUNTER — Other Ambulatory Visit: Payer: Self-pay

## 2020-09-01 ENCOUNTER — Other Ambulatory Visit: Payer: Self-pay

## 2020-09-02 ENCOUNTER — Other Ambulatory Visit: Payer: Self-pay

## 2020-09-02 MED ORDER — CARESTART COVID-19 HOME TEST VI KIT
PACK | 0 refills | Status: DC
  Filled 2020-09-02: qty 4, 4d supply, fill #0

## 2020-09-05 ENCOUNTER — Other Ambulatory Visit: Payer: Self-pay

## 2020-09-05 DIAGNOSIS — Z3183 Encounter for assisted reproductive fertility procedure cycle: Secondary | ICD-10-CM | POA: Diagnosis not present

## 2020-09-05 DIAGNOSIS — N979 Female infertility, unspecified: Secondary | ICD-10-CM | POA: Diagnosis not present

## 2020-09-05 DIAGNOSIS — Z113 Encounter for screening for infections with a predominantly sexual mode of transmission: Secondary | ICD-10-CM | POA: Diagnosis not present

## 2020-09-05 DIAGNOSIS — Z319 Encounter for procreative management, unspecified: Secondary | ICD-10-CM | POA: Diagnosis not present

## 2020-09-05 DIAGNOSIS — E2839 Other primary ovarian failure: Secondary | ICD-10-CM | POA: Diagnosis not present

## 2020-09-06 ENCOUNTER — Other Ambulatory Visit: Payer: Self-pay

## 2020-09-06 ENCOUNTER — Other Ambulatory Visit: Payer: 59

## 2020-09-07 ENCOUNTER — Encounter: Payer: Self-pay | Admitting: Family Medicine

## 2020-09-07 ENCOUNTER — Other Ambulatory Visit (INDEPENDENT_AMBULATORY_CARE_PROVIDER_SITE_OTHER): Payer: 59

## 2020-09-07 ENCOUNTER — Other Ambulatory Visit: Payer: 59

## 2020-09-07 ENCOUNTER — Other Ambulatory Visit: Payer: Self-pay

## 2020-09-07 DIAGNOSIS — R7989 Other specified abnormal findings of blood chemistry: Secondary | ICD-10-CM | POA: Diagnosis not present

## 2020-09-07 LAB — T3, FREE: T3, Free: 4.1 pg/mL (ref 2.3–4.2)

## 2020-09-07 LAB — TSH: TSH: 0.02 u[IU]/mL — ABNORMAL LOW (ref 0.35–5.50)

## 2020-09-07 LAB — T4, FREE: Free T4: 1.39 ng/dL (ref 0.60–1.60)

## 2020-09-08 DIAGNOSIS — O905 Postpartum thyroiditis: Secondary | ICD-10-CM | POA: Diagnosis not present

## 2020-09-08 DIAGNOSIS — R7989 Other specified abnormal findings of blood chemistry: Secondary | ICD-10-CM | POA: Diagnosis not present

## 2020-09-09 DIAGNOSIS — Z319 Encounter for procreative management, unspecified: Secondary | ICD-10-CM | POA: Diagnosis not present

## 2020-09-09 DIAGNOSIS — E2839 Other primary ovarian failure: Secondary | ICD-10-CM | POA: Diagnosis not present

## 2020-09-09 DIAGNOSIS — N979 Female infertility, unspecified: Secondary | ICD-10-CM | POA: Diagnosis not present

## 2020-09-09 DIAGNOSIS — Z3183 Encounter for assisted reproductive fertility procedure cycle: Secondary | ICD-10-CM | POA: Diagnosis not present

## 2020-09-12 ENCOUNTER — Other Ambulatory Visit: Payer: Self-pay | Admitting: Surgery

## 2020-09-12 DIAGNOSIS — O905 Postpartum thyroiditis: Secondary | ICD-10-CM

## 2020-09-13 DIAGNOSIS — E2839 Other primary ovarian failure: Secondary | ICD-10-CM | POA: Diagnosis not present

## 2020-09-13 DIAGNOSIS — N979 Female infertility, unspecified: Secondary | ICD-10-CM | POA: Diagnosis not present

## 2020-09-13 DIAGNOSIS — Z3183 Encounter for assisted reproductive fertility procedure cycle: Secondary | ICD-10-CM | POA: Diagnosis not present

## 2020-09-13 DIAGNOSIS — Z319 Encounter for procreative management, unspecified: Secondary | ICD-10-CM | POA: Diagnosis not present

## 2020-09-15 ENCOUNTER — Ambulatory Visit
Admission: RE | Admit: 2020-09-15 | Discharge: 2020-09-15 | Disposition: A | Discharge status: Home / Self Care | Payer: 59 | Source: Ambulatory Visit | Attending: Surgery | Admitting: Surgery

## 2020-09-15 ENCOUNTER — Other Ambulatory Visit: Payer: Self-pay

## 2020-09-15 DIAGNOSIS — E061 Subacute thyroiditis: Secondary | ICD-10-CM | POA: Diagnosis not present

## 2020-09-15 DIAGNOSIS — O905 Postpartum thyroiditis: Secondary | ICD-10-CM | POA: Diagnosis not present

## 2020-09-16 DIAGNOSIS — Z319 Encounter for procreative management, unspecified: Secondary | ICD-10-CM | POA: Diagnosis not present

## 2020-09-16 DIAGNOSIS — Z3183 Encounter for assisted reproductive fertility procedure cycle: Secondary | ICD-10-CM | POA: Diagnosis not present

## 2020-09-16 DIAGNOSIS — E2839 Other primary ovarian failure: Secondary | ICD-10-CM | POA: Diagnosis not present

## 2020-09-16 DIAGNOSIS — Z113 Encounter for screening for infections with a predominantly sexual mode of transmission: Secondary | ICD-10-CM | POA: Diagnosis not present

## 2020-09-16 DIAGNOSIS — N979 Female infertility, unspecified: Secondary | ICD-10-CM | POA: Diagnosis not present

## 2020-09-19 ENCOUNTER — Other Ambulatory Visit: Payer: Self-pay

## 2020-09-21 DIAGNOSIS — G47411 Narcolepsy with cataplexy: Secondary | ICD-10-CM | POA: Diagnosis not present

## 2020-09-22 DIAGNOSIS — N979 Female infertility, unspecified: Secondary | ICD-10-CM | POA: Diagnosis not present

## 2020-09-22 DIAGNOSIS — Z3183 Encounter for assisted reproductive fertility procedure cycle: Secondary | ICD-10-CM | POA: Diagnosis not present

## 2020-09-30 ENCOUNTER — Other Ambulatory Visit
Admission: RE | Admit: 2020-09-30 | Discharge: 2020-09-30 | Disposition: A | Discharge status: Home / Self Care | Payer: 59 | Attending: Obstetrics and Gynecology | Admitting: Obstetrics and Gynecology

## 2020-09-30 DIAGNOSIS — Z3200 Encounter for pregnancy test, result unknown: Secondary | ICD-10-CM | POA: Insufficient documentation

## 2020-09-30 LAB — HCG, QUANTITATIVE, PREGNANCY: hCG, Beta Chain, Quant, S: 12 m[IU]/mL — ABNORMAL HIGH (ref <5)

## 2020-10-03 ENCOUNTER — Other Ambulatory Visit
Admission: RE | Admit: 2020-10-03 | Discharge: 2020-10-03 | Disposition: A | Discharge status: Home / Self Care | Payer: 59 | Attending: Obstetrics and Gynecology | Admitting: Obstetrics and Gynecology

## 2020-10-03 DIAGNOSIS — Z3201 Encounter for pregnancy test, result positive: Secondary | ICD-10-CM | POA: Diagnosis not present

## 2020-10-03 LAB — HCG, QUANTITATIVE, PREGNANCY: hCG, Beta Chain, Quant, S: 28 m[IU]/mL — ABNORMAL HIGH (ref <5)

## 2020-10-05 ENCOUNTER — Other Ambulatory Visit
Admission: RE | Admit: 2020-10-05 | Discharge: 2020-10-05 | Disposition: A | Discharge status: Home / Self Care | Payer: 59 | Attending: Obstetrics and Gynecology | Admitting: Obstetrics and Gynecology

## 2020-10-05 ENCOUNTER — Other Ambulatory Visit: Payer: Self-pay

## 2020-10-05 DIAGNOSIS — Z3201 Encounter for pregnancy test, result positive: Secondary | ICD-10-CM | POA: Diagnosis not present

## 2020-10-05 LAB — HCG, QUANTITATIVE, PREGNANCY: hCG, Beta Chain, Quant, S: 15 m[IU]/mL — ABNORMAL HIGH (ref <5)

## 2020-10-05 MED ORDER — OVIDREL 250 MCG/0.5ML ~~LOC~~ INJ
INJECTION | SUBCUTANEOUS | 1 refills | Status: DC
  Filled 2020-10-05: qty 0.5, 1d supply, fill #0

## 2020-10-05 MED ORDER — ESTRADIOL 0.1 MG/24HR TD PTTW
MEDICATED_PATCH | TRANSDERMAL | 3 refills | Status: DC
  Filled 2020-10-05: qty 8, 24d supply, fill #0
  Filled 2020-10-05: qty 8, 28d supply, fill #0
  Filled 2020-10-05: qty 8, 24d supply, fill #0
  Filled 2020-11-28: qty 8, 28d supply, fill #1

## 2020-10-05 MED ORDER — "BD DISP NEEDLES 22G X 1-1/2"" MISC"
2 refills | Status: DC
  Filled 2020-10-05: qty 30, 10d supply, fill #0

## 2020-10-05 MED ORDER — "BD LUER-LOK SYRINGE 18G X 1-1/2"" 3 ML MISC"
2 refills | Status: DC
  Filled 2020-10-05: qty 30, 10d supply, fill #0
  Filled 2020-11-04: qty 30, 10d supply, fill #1
  Filled 2020-11-04: qty 60, 30d supply, fill #1
  Filled 2020-11-04: qty 30, 10d supply, fill #1
  Filled 2020-12-12: qty 30, 10d supply, fill #2

## 2020-10-05 MED ORDER — PROGESTERONE 50 MG/ML IM OIL
TOPICAL_OIL | INTRAMUSCULAR | 2 refills | Status: DC
  Filled 2020-10-05: qty 30, 30d supply, fill #0

## 2020-10-05 MED ORDER — CETROTIDE 0.25 MG ~~LOC~~ KIT
PACK | SUBCUTANEOUS | 1 refills | Status: DC
  Filled 2020-10-05: qty 5, 5d supply, fill #0

## 2020-10-05 MED ORDER — GONAL-F RFF REDIJECT 900 UNIT/1.5ML ~~LOC~~ SOPN
PEN_INJECTOR | SUBCUTANEOUS | 1 refills | Status: DC
  Filled 2020-10-05: qty 3, 18d supply, fill #0

## 2020-10-06 ENCOUNTER — Other Ambulatory Visit: Payer: Self-pay

## 2020-10-06 DIAGNOSIS — R946 Abnormal results of thyroid function studies: Secondary | ICD-10-CM | POA: Diagnosis not present

## 2020-10-06 DIAGNOSIS — R7989 Other specified abnormal findings of blood chemistry: Secondary | ICD-10-CM | POA: Diagnosis not present

## 2020-10-06 MED ORDER — METHYLPREDNISOLONE 4 MG PO TABS
8.0000 mg | ORAL_TABLET | Freq: Two times a day (BID) | ORAL | 0 refills | Status: DC
  Filled 2020-10-06: qty 16, 4d supply, fill #0

## 2020-10-10 DIAGNOSIS — N979 Female infertility, unspecified: Secondary | ICD-10-CM | POA: Diagnosis not present

## 2020-10-10 DIAGNOSIS — Z319 Encounter for procreative management, unspecified: Secondary | ICD-10-CM | POA: Diagnosis not present

## 2020-10-10 DIAGNOSIS — Z113 Encounter for screening for infections with a predominantly sexual mode of transmission: Secondary | ICD-10-CM | POA: Diagnosis not present

## 2020-10-10 DIAGNOSIS — E2839 Other primary ovarian failure: Secondary | ICD-10-CM | POA: Diagnosis not present

## 2020-10-10 DIAGNOSIS — Z3183 Encounter for assisted reproductive fertility procedure cycle: Secondary | ICD-10-CM | POA: Diagnosis not present

## 2020-10-13 ENCOUNTER — Other Ambulatory Visit: Payer: Self-pay

## 2020-10-13 DIAGNOSIS — R768 Other specified abnormal immunological findings in serum: Secondary | ICD-10-CM | POA: Diagnosis not present

## 2020-10-13 MED ORDER — LEVOTHYROXINE SODIUM 25 MCG PO TABS
ORAL_TABLET | ORAL | 5 refills | Status: DC
  Filled 2020-10-13: qty 30, 30d supply, fill #0
  Filled 2020-11-08 (×2): qty 30, 30d supply, fill #1

## 2020-10-14 DIAGNOSIS — N979 Female infertility, unspecified: Secondary | ICD-10-CM | POA: Diagnosis not present

## 2020-10-14 DIAGNOSIS — Z3183 Encounter for assisted reproductive fertility procedure cycle: Secondary | ICD-10-CM | POA: Diagnosis not present

## 2020-10-17 DIAGNOSIS — N979 Female infertility, unspecified: Secondary | ICD-10-CM | POA: Diagnosis not present

## 2020-10-17 DIAGNOSIS — Z3183 Encounter for assisted reproductive fertility procedure cycle: Secondary | ICD-10-CM | POA: Diagnosis not present

## 2020-10-18 DIAGNOSIS — N979 Female infertility, unspecified: Secondary | ICD-10-CM | POA: Diagnosis not present

## 2020-10-18 DIAGNOSIS — E288 Other ovarian dysfunction: Secondary | ICD-10-CM | POA: Diagnosis not present

## 2020-10-18 DIAGNOSIS — Z3183 Encounter for assisted reproductive fertility procedure cycle: Secondary | ICD-10-CM | POA: Diagnosis not present

## 2020-10-22 DIAGNOSIS — N979 Female infertility, unspecified: Secondary | ICD-10-CM | POA: Diagnosis not present

## 2020-10-22 DIAGNOSIS — Z3183 Encounter for assisted reproductive fertility procedure cycle: Secondary | ICD-10-CM | POA: Diagnosis not present

## 2020-10-24 DIAGNOSIS — Z3183 Encounter for assisted reproductive fertility procedure cycle: Secondary | ICD-10-CM | POA: Diagnosis not present

## 2020-10-24 DIAGNOSIS — N979 Female infertility, unspecified: Secondary | ICD-10-CM | POA: Diagnosis not present

## 2020-10-28 DIAGNOSIS — Z319 Encounter for procreative management, unspecified: Secondary | ICD-10-CM | POA: Diagnosis not present

## 2020-10-28 DIAGNOSIS — Z3183 Encounter for assisted reproductive fertility procedure cycle: Secondary | ICD-10-CM | POA: Diagnosis not present

## 2020-10-28 DIAGNOSIS — E2839 Other primary ovarian failure: Secondary | ICD-10-CM | POA: Diagnosis not present

## 2020-11-01 ENCOUNTER — Other Ambulatory Visit
Admission: RE | Admit: 2020-11-01 | Discharge: 2020-11-01 | Disposition: A | Discharge status: Home / Self Care | Payer: 59 | Attending: Obstetrics and Gynecology | Admitting: Obstetrics and Gynecology

## 2020-11-01 DIAGNOSIS — Z3200 Encounter for pregnancy test, result unknown: Secondary | ICD-10-CM | POA: Insufficient documentation

## 2020-11-01 LAB — HCG, QUANTITATIVE, PREGNANCY: hCG, Beta Chain, Quant, S: 103 m[IU]/mL — ABNORMAL HIGH (ref <5)

## 2020-11-02 ENCOUNTER — Other Ambulatory Visit: Payer: Self-pay

## 2020-11-02 ENCOUNTER — Ambulatory Visit (INDEPENDENT_AMBULATORY_CARE_PROVIDER_SITE_OTHER): Payer: 59 | Admitting: Family Medicine

## 2020-11-02 ENCOUNTER — Encounter: Payer: Self-pay | Admitting: Family Medicine

## 2020-11-02 DIAGNOSIS — F419 Anxiety disorder, unspecified: Secondary | ICD-10-CM | POA: Diagnosis not present

## 2020-11-02 DIAGNOSIS — I83891 Varicose veins of right lower extremities with other complications: Secondary | ICD-10-CM

## 2020-11-02 DIAGNOSIS — Z0001 Encounter for general adult medical examination with abnormal findings: Secondary | ICD-10-CM | POA: Diagnosis not present

## 2020-11-02 DIAGNOSIS — F32A Depression, unspecified: Secondary | ICD-10-CM | POA: Diagnosis not present

## 2020-11-02 MED ORDER — "INSULIN SYRINGE/NEEDLE 28G X 1/2"" 1 ML MISC"
6 refills | Status: DC
  Filled 2020-11-02: qty 60, 30d supply, fill #0

## 2020-11-02 MED ORDER — HEPARIN SODIUM (PORCINE) 5000 UNIT/ML IJ SOLN
INTRAMUSCULAR | 6 refills | Status: DC
  Filled 2020-11-02: qty 60, 30d supply, fill #0
  Filled 2020-11-03: qty 35, 18d supply, fill #0
  Filled 2020-11-03: qty 25, 12d supply, fill #0
  Filled 2020-11-28: qty 6, 3d supply, fill #1
  Filled 2020-11-28: qty 54, 27d supply, fill #1

## 2020-11-02 MED ORDER — BECLOMETHASONE DIPROP HFA 40 MCG/ACT IN AERB
2.0000 | INHALATION_SPRAY | Freq: Two times a day (BID) | RESPIRATORY_TRACT | 2 refills | Status: AC
  Filled 2020-11-02: qty 10.6, 30d supply, fill #0
  Filled 2021-02-09: qty 10.6, 30d supply, fill #1

## 2020-11-02 NOTE — Progress Notes (Signed)
Marikay Alar, MD Phone: [phone]  Tanya Glenn is a 35 y.o. female who presents today for CPE.  Diet: generally healthy Exercise: some, though does not have the energy to do much given she is off her narcolepsy medication due to pregnancy and she has been going to appointments with her mother who has breast cancer Pap smear: 10/08/18, NILM through GYN Family history-  Colon cancer: no  Breast cancer: mom  Ovarian cancer: no Menses: pregnant x 4 weeks Vaccines-   Flu: through work  Tetanus: UTD  COVID19: UTD, declines future vaccination given prior stillbirth following vaccination HIV screening: UTD Hep C Screening: UTD Tobacco use: no Alcohol use: no Illicit Drug use: no Dentist: yes Ophthalmology: yes  Anxiety/depression/stress: Patient notes anxiety and depression.  She also notes a lot of stress.  She has a lot going on.  She had a stillbirth previously.  Her mother was recently diagnosed with metastatic breast cancer.  She does not want to add any medication as it could increase the risk of an adverse outcome with her current pregnancy.  She is going to do couples counseling.  She feels as though her prior therapy was not helpful as the person had not gone through what she went through.  She notes no SI.  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  She notes occasional heaviness in her right leg.  She feels as though there is a varicosity in her right foot.  She notes she has a history of a procedure to close off the veins in her 16s.  Active Ambulatory Problems    Diagnosis Date Noted   Leg pain, right 09/26/2015   Psoriasis of scalp 03/01/2011   Anxiety and depression 02/05/2012   Asthma, mild intermittent 02/16/2016   Palpitations 10/08/2016   Hypokalemia 10/08/2016   Narcolepsy 12/21/2016   BPPV (benign paroxysmal positional vertigo) 06/26/2017   GERD (gastroesophageal reflux disease) 06/26/2017   Lipid screening 04/29/2018   Pre-conception counseling  04/29/2018   Dizziness 09/01/2019   Supervision of high risk pregnancy, antepartum 01/04/2020   Encounter for supervision of pregnancy resulting from assisted reproductive technology 01/04/2020   Advanced maternal age, primigravida 01/04/2020   Asthma during pregnancy 01/04/2020   Abdominal pain during pregnancy in third trimester 04/30/2020   Decreased fetal movement 05/02/2020   Fetal demise, greater than 22 weeks, antepartum 05/02/2020   [redacted] weeks gestation of pregnancy 05/04/2020   Low TSH level 08/15/2020   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   Thrombocytopenia (HCC) 08/15/2020   Encounter for general adult medical examination with abnormal findings 11/02/2020   Varicose veins of leg with swelling, 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   Resolved Ambulatory Problems    Diagnosis Date Noted   Asthma exacerbation 05/23/2018   Vaginal candidiasis 10/31/2018   Past Medical History:  Diagnosis Date   Asthma    Asthma    Symptomatic PVCs     Family History  Problem Relation Age of Onset   Heart disease Maternal Grandmother    Stroke Maternal Grandmother    Hyperlipidemia Father     Social History   Socioeconomic History   Marital status: Married    Spouse name: Trish Mage   Number of children: Not on file   Years of education: Not on file   Highest education level: Not on file  Occupational History   Occupation: Pharmacist  Tobacco Use   Smoking status: Never   Smokeless tobacco: Never  Vaping Use   Vaping Use: Never used  Substance and Sexual Activity  Alcohol use: Yes    Comment: occasional drink on the weekend.   Drug use: No   Sexual activity: Yes    Birth control/protection: None  Other Topics Concern   Not on file  Social History Narrative   Lives in [place].  Exercises - wt training - 4+ x /wk.     Social Determinants of Health   Financial Resource Strain: Not on file  Food Insecurity: Not on file  Transportation Needs: Not on file  Physical Activity: Not  on file  Stress: Not on file  Social Connections: Not on file  Intimate Partner Violence: Not on file    ROS  General:  Negative for nexplained weight loss, fever Skin: Negative for new or changing mole, sore that won't heal HEENT: Negative for trouble hearing, trouble seeing, ringing in ears, mouth sores, hoarseness, change in voice, dysphagia. CV:  Negative for chest pain, dyspnea, edema, palpitations Resp: Negative for cough, dyspnea, hemoptysis GI: Negative for nausea, vomiting, diarrhea, constipation, abdominal pain, melena, hematochezia. GU: Negative for dysuria, incontinence, urinary hesitance, hematuria, vaginal or penile discharge, polyuria, sexual difficulty, lumps in testicle or breasts MSK: Negative for muscle cramps or aches, joint pain or swelling Neuro: Negative for headaches, weakness, numbness, dizziness, passing out/fainting Psych: Positive for depression, anxiety, memory problems  Objective  Physical Exam Vitals:   11/02/20 1534  BP: 120/80  Pulse: (!) 55  Temp: 98.8 F (37.1 C)  SpO2: 98%    BP Readings from Last 3 Encounters:  11/02/20 120/80  08/12/20 118/70  05/18/20 125/74   Wt Readings from Last 3 Encounters:  11/02/20 147 lb (66.7 kg)  08/12/20 154 lb 3.2 oz (69.9 kg)  05/18/20 158 lb (71.7 kg)    Physical Exam Constitutional:      General: She is not in acute distress.    Appearance: She is not diaphoretic.  HENT:     Head: Normocephalic and atraumatic.  Eyes:     Conjunctiva/sclera: Conjunctivae normal.     Pupils: Pupils are equal, round, and reactive to light.  Cardiovascular:     Rate and Rhythm: Normal rate and regular rhythm.     Heart sounds: Normal heart sounds.  Pulmonary:     Effort: Pulmonary effort is normal.     Breath sounds: Normal breath sounds.  Abdominal:     General: Bowel sounds are normal. There is no distension.     Palpations: Abdomen is soft.     Tenderness: There is no abdominal tenderness.   Musculoskeletal:     Right lower leg: No edema.     Left lower leg: No edema.  Lymphadenopathy:     Cervical: No cervical adenopathy.  Skin:    General: Skin is warm and dry.  Neurological:     Mental Status: She is alert.     Assessment/Plan:   Problem List Items Addressed This Visit     Anxiety and depression    The patient has had numerous significant life events recently.  Discussed options of medication and/or therapy.  Understandably she wants to defer medication at this time as she is pregnant.  She is going to do couples counseling with her husband and that may be helpful.  If she wants our help finding a individual therapist she will let us know.      Encounter for general adult medical examination with abnormal findings    Physical exam completed.  She will continue healthy diet.  Encouraged healthy exercise.  Pap smear is up-to-date.  She will get her flu vaccine through work.  She defers any further COVID vaccines.  We will defer lab work to next year.      Varicose veins of leg with swelling, right    Some occasional swelling towards her feet.  She has some heaviness.  I discussed that wearing compression stockings would be the best thing at this time.  She will also try to elevate her legs.       Return in about 1 year (around 11/02/2021) for CPE.  This visit occurred during the SARS-CoV-2 public health emergency.  Safety protocols were in place, including screening questions prior to the visit, additional usage of staff PPE, and extensive cleaning of exam room while observing appropriate contact time as indicated for disinfecting solutions.    Marikay Alar, MD [facility]

## 2020-11-02 NOTE — Assessment & Plan Note (Signed)
Some occasional swelling towards her feet.  She has some heaviness.  I discussed that wearing compression stockings would be the best thing at this time.  She will also try to elevate her legs.

## 2020-11-02 NOTE — Assessment & Plan Note (Signed)
Physical exam completed.  She will continue healthy diet.  Encouraged healthy exercise.  Pap smear is up-to-date.  She will get her flu vaccine through work.  She defers any further COVID vaccines.  We will defer lab work to next year.

## 2020-11-02 NOTE — Assessment & Plan Note (Signed)
The patient has had numerous significant life events recently.  Discussed options of medication and/or therapy.  Understandably she wants to defer medication at this time as she is pregnant.  She is going to do couples counseling with her husband and that may be helpful.  If she wants our help finding a individual therapist she will let us know.

## 2020-11-02 NOTE — Patient Instructions (Signed)
Nice to see you. If you would like help finding a therapist please let me know.  Please where your compression stockings and elevated your legs as you are able to.

## 2020-11-03 ENCOUNTER — Other Ambulatory Visit
Admission: RE | Admit: 2020-11-03 | Discharge: 2020-11-03 | Disposition: A | Discharge status: Home / Self Care | Payer: 59 | Attending: Obstetrics and Gynecology | Admitting: Obstetrics and Gynecology

## 2020-11-03 ENCOUNTER — Other Ambulatory Visit: Payer: Self-pay

## 2020-11-03 DIAGNOSIS — Z3200 Encounter for pregnancy test, result unknown: Secondary | ICD-10-CM | POA: Insufficient documentation

## 2020-11-03 LAB — HCG, QUANTITATIVE, PREGNANCY: hCG, Beta Chain, Quant, S: 279 m[IU]/mL — ABNORMAL HIGH (ref <5)

## 2020-11-04 ENCOUNTER — Other Ambulatory Visit: Payer: Self-pay

## 2020-11-07 ENCOUNTER — Other Ambulatory Visit
Admission: RE | Admit: 2020-11-07 | Discharge: 2020-11-07 | Disposition: A | Discharge status: Home / Self Care | Payer: 59 | Attending: Obstetrics and Gynecology | Admitting: Obstetrics and Gynecology

## 2020-11-07 DIAGNOSIS — Z3201 Encounter for pregnancy test, result positive: Secondary | ICD-10-CM | POA: Diagnosis not present

## 2020-11-07 LAB — HCG, QUANTITATIVE, PREGNANCY: hCG, Beta Chain, Quant, S: 1699 m[IU]/mL — ABNORMAL HIGH (ref <5)

## 2020-11-08 ENCOUNTER — Ambulatory Visit: Payer: 59 | Admitting: Endocrinology

## 2020-11-08 ENCOUNTER — Other Ambulatory Visit: Payer: Self-pay

## 2020-11-14 DIAGNOSIS — R768 Other specified abnormal immunological findings in serum: Secondary | ICD-10-CM | POA: Diagnosis not present

## 2020-11-15 DIAGNOSIS — Z3200 Encounter for pregnancy test, result unknown: Secondary | ICD-10-CM | POA: Diagnosis not present

## 2020-11-16 ENCOUNTER — Other Ambulatory Visit: Payer: Self-pay

## 2020-11-16 MED ORDER — LEVOTHYROXINE SODIUM 50 MCG PO TABS
ORAL_TABLET | ORAL | 2 refills | Status: DC
  Filled 2020-11-16: qty 30, 30d supply, fill #0
  Filled 2020-12-12: qty 30, 30d supply, fill #1
  Filled 2021-01-10: qty 30, 30d supply, fill #2

## 2020-11-28 ENCOUNTER — Other Ambulatory Visit: Payer: Self-pay | Admitting: Family Medicine

## 2020-11-28 ENCOUNTER — Other Ambulatory Visit: Payer: Self-pay

## 2020-11-28 MED ORDER — ALBUTEROL SULFATE HFA 108 (90 BASE) MCG/ACT IN AERS
INHALATION_SPRAY | RESPIRATORY_TRACT | 2 refills | Status: DC
  Filled 2020-11-28: qty 18, 30d supply, fill #0
  Filled 2021-02-09: qty 18, 30d supply, fill #1

## 2020-11-29 ENCOUNTER — Other Ambulatory Visit: Payer: Self-pay

## 2020-11-29 DIAGNOSIS — Z3200 Encounter for pregnancy test, result unknown: Secondary | ICD-10-CM | POA: Diagnosis not present

## 2020-11-30 ENCOUNTER — Other Ambulatory Visit: Payer: Self-pay

## 2020-12-05 DIAGNOSIS — O0900 Supervision of pregnancy with history of infertility, unspecified trimester: Secondary | ICD-10-CM | POA: Diagnosis not present

## 2020-12-09 DIAGNOSIS — R946 Abnormal results of thyroid function studies: Secondary | ICD-10-CM | POA: Diagnosis not present

## 2020-12-12 ENCOUNTER — Other Ambulatory Visit: Payer: Self-pay

## 2020-12-13 ENCOUNTER — Other Ambulatory Visit: Payer: Self-pay

## 2020-12-13 DIAGNOSIS — O99281 Endocrine, nutritional and metabolic diseases complicating pregnancy, first trimester: Secondary | ICD-10-CM | POA: Diagnosis not present

## 2020-12-13 DIAGNOSIS — E038 Other specified hypothyroidism: Secondary | ICD-10-CM | POA: Diagnosis not present

## 2020-12-13 DIAGNOSIS — E079 Disorder of thyroid, unspecified: Secondary | ICD-10-CM | POA: Diagnosis not present

## 2020-12-13 DIAGNOSIS — E063 Autoimmune thyroiditis: Secondary | ICD-10-CM | POA: Diagnosis not present

## 2020-12-13 MED ORDER — LEVOTHYROXINE SODIUM 50 MCG PO TABS
ORAL_TABLET | ORAL | 2 refills | Status: DC
  Filled 2020-12-13: qty 30, 30d supply, fill #0

## 2020-12-19 ENCOUNTER — Other Ambulatory Visit: Payer: Self-pay

## 2020-12-19 ENCOUNTER — Encounter: Payer: Self-pay | Admitting: Obstetrics and Gynecology

## 2020-12-19 ENCOUNTER — Ambulatory Visit (INDEPENDENT_AMBULATORY_CARE_PROVIDER_SITE_OTHER): Payer: 59 | Admitting: Obstetrics and Gynecology

## 2020-12-19 VITALS — BP 122/70 | Wt 155.0 lb

## 2020-12-19 DIAGNOSIS — O99280 Endocrine, nutritional and metabolic diseases complicating pregnancy, unspecified trimester: Secondary | ICD-10-CM | POA: Diagnosis not present

## 2020-12-19 DIAGNOSIS — O99519 Diseases of the respiratory system complicating pregnancy, unspecified trimester: Secondary | ICD-10-CM | POA: Diagnosis not present

## 2020-12-19 DIAGNOSIS — N912 Amenorrhea, unspecified: Secondary | ICD-10-CM | POA: Diagnosis not present

## 2020-12-19 DIAGNOSIS — O09819 Supervision of pregnancy resulting from assisted reproductive technology, unspecified trimester: Secondary | ICD-10-CM

## 2020-12-19 DIAGNOSIS — G47411 Narcolepsy with cataplexy: Secondary | ICD-10-CM | POA: Diagnosis not present

## 2020-12-19 DIAGNOSIS — O0991 Supervision of high risk pregnancy, unspecified, first trimester: Secondary | ICD-10-CM

## 2020-12-19 DIAGNOSIS — O09521 Supervision of elderly multigravida, first trimester: Secondary | ICD-10-CM

## 2020-12-19 DIAGNOSIS — Z1379 Encounter for other screening for genetic and chromosomal anomalies: Secondary | ICD-10-CM

## 2020-12-19 DIAGNOSIS — R760 Raised antibody titer: Secondary | ICD-10-CM

## 2020-12-19 DIAGNOSIS — O09299 Supervision of pregnancy with other poor reproductive or obstetric history, unspecified trimester: Secondary | ICD-10-CM

## 2020-12-19 DIAGNOSIS — Z3A10 10 weeks gestation of pregnancy: Secondary | ICD-10-CM | POA: Diagnosis not present

## 2020-12-19 DIAGNOSIS — Z131 Encounter for screening for diabetes mellitus: Secondary | ICD-10-CM

## 2020-12-19 DIAGNOSIS — E039 Hypothyroidism, unspecified: Secondary | ICD-10-CM | POA: Insufficient documentation

## 2020-12-19 DIAGNOSIS — J45909 Unspecified asthma, uncomplicated: Secondary | ICD-10-CM

## 2020-12-19 LAB — OB RESULTS CONSOLE VARICELLA ZOSTER ANTIBODY, IGG: Varicella: IMMUNE

## 2020-12-19 LAB — POCT URINE PREGNANCY: Preg Test, Ur: POSITIVE — AB

## 2020-12-19 MED ORDER — ENOXAPARIN SODIUM 40 MG/0.4ML IJ SOSY
40.0000 mg | PREFILLED_SYRINGE | INTRAMUSCULAR | 3 refills | Status: DC
  Filled 2020-12-19: qty 12, 30d supply, fill #0

## 2020-12-19 NOTE — Progress Notes (Signed)
NOB today. LMP 10/07/2020    New Obstetric Patient H&P   Chief Complaint: "Desires prenatal care"  History of Present Illness: Patient is a 35 y.o. G2P0100 Not Hispanic or Latino female.  Based on her  a day 5 embryo transfer, her EDD is Estimated Date of Delivery: 07/12/21 and her EGA is [redacted]w[redacted]d. Her last pap smear was 2 years ago and was no abnormalities.    Since her LMP she claims she has experienced no issues. She denies vaginal bleeding. Her past medical history is notable for narcolepsy. Her prior pregnancies are notable for stillbirth at [redacted] weeks gestation. Workup was essentially negative, except +anticardiolipin abs in the 20s.   Since her LMP, she admits to the use of tobacco products  no She claims she has gained a few pounds since the start of her pregnancy.  There are cats in the home in the home  no She admits close contact with children on a regular basis  no  She has had chicken pox in the past yes She has had Tuberculosis exposures, symptoms, or previously tested positive for TB   yes Current or past history of domestic violence. no  Genetic Screening/Teratology Counseling: (Includes patient, baby's father, or anyone in either family with:)   1. Patient's age >/= 31 at [facility]  yes 2. Thalassemia ([place], [place], Mediterranean, or Asian background): MCV<80  no 3. Neural tube defect (meningomyelocele, spina bifida, anencephaly)  no 4. Congenital heart defect  no  5. Down syndrome  no 6. Tay-Sachs (Jewish, [place]  no 7. Canavan's Disease  no 8. Sickle cell disease or trait (African)  no  9. Hemophilia or other blood disorders  no  10. Muscular dystrophy  no  11. Cystic fibrosis  no  12. Huntington's Chorea  no  13. Mental retardation/autism  no 14. Other inherited genetic or chromosomal disorder  no 15. Maternal metabolic disorder (DM, PKU, etc)  no 16. Patient or FOB with a child with a birth defect not listed above no  16a. Patient or FOB with a birth defect  themselves no 17. Recurrent pregnancy loss, or stillbirth: Stillbirth x 1.  18. Any medications since LMP other than prenatal vitamins (include vitamins, supplements, OTC meds, drugs, alcohol)  yes.  Estrogen, progesterone, heparin. Aspirin, levothyroxine. She is also taking calcium and vitamin D, along with docusate.  19. Any other genetic/environmental exposure to discuss  no  Infection History:   1. Lives with someone with TB or TB exposed  no  2. Patient or partner has history of genital herpes  no 3. Rash or viral illness since LMP  no 4. History of STI (GC, CT, HPV, syphilis, HIV)  no 5. History of recent travel :  no  Other pertinent information:  she also has hypothyroidism since her last pregnancy.      Review of Systems:10 point review of systems negative unless otherwise noted in HPI  Past Medical History:  Diagnosis Date   Asthma    Asthma    Narcolepsy    Symptomatic PVCs     Past Surgical History:  Procedure Laterality Date   WISDOM TOOTH EXTRACTION      Gynecologic History: Patient's last menstrual period was 10/07/2020 (exact date).  Obstetric History: G2P0100  Family History  Problem Relation Age of Onset   Breast cancer Mother    Hyperlipidemia Father    Heart disease Maternal Grandmother    Stroke Maternal Grandmother     Social History   Socioeconomic History  Marital status: Married    Spouse name: Trish Mage   Number of children: Not on file   Years of education: Not on file   Highest education level: Not on file  Occupational History   Occupation: Pharmacist  Tobacco Use   Smoking status: Never   Smokeless tobacco: Never  Vaping Use   Vaping Use: Never used  Substance and Sexual Activity   Alcohol use: Yes    Comment: occasional drink on the weekend.   Drug use: No   Sexual activity: Yes    Birth control/protection: None  Other Topics Concern   Not on file  Social History Narrative   Lives in [place].  Exercises - wt  training - 4+ x /wk.     Social Determinants of Health   Financial Resource Strain: Not on file  Food Insecurity: Not on file  Transportation Needs: Not on file  Physical Activity: Not on file  Stress: Not on file  Social Connections: Not on file  Intimate Partner Violence: Not on file    Allergies  Allergen Reactions   Sulfa Antibiotics Hives    Prior to Admission medications   Medication Sig Start Date End Date Taking? Authorizing Provider  albuterol (VENTOLIN HFA) 108 (90 Base) MCG/ACT inhaler INHALE 1 PUFF BY MOUTH EVERY 4 HOURS AS NEEDED FOR WHEEZING OR SHORTNESS OF BREATH. 11/28/20 11/28/21 Yes Glori Luis, MD  aspirin 81 MG chewable tablet Chew by mouth daily.   Yes [provider]  beclomethasone (QVAR) 40 MCG/ACT inhaler INHALE 2 PUFFS INTO THE LUNGS TWO TIMES DAILY 11/02/20 11/02/21 Yes Glori Luis, MD  heparin 5000 UNIT/ML injection inject 1 ml twice a day subq 11/02/20  Yes   levothyroxine (SYNTHROID) 50 MCG tablet Take 1 tablet (50 mcg total) by mouth once daily. Take on an empty stomach with a glass of water at least 30-60 minutes before breakfast. 11/16/20  Yes   NEEDLE, DISP, 22 G (BD DISP NEEDLES) 22G X 1-1/2" MISC Use as directed 10/05/20  Yes   NEEDLE, DISP, 30 G (BD DISP NEEDLES) 30G X 1/2" MISC use as directed 08/04/20  Yes Fermin Schwab, MD  omega-3 acid ethyl esters (LOVAZA) 1 g capsule Take by mouth 2 (two) times daily.   Yes [provider]  medroxyPROGESTERone (PROVERA) 10 MG tablet Take 1 tablet (10 mg total) by mouth daily for 10 days. 07/15/20 07/25/20  Conard Novak, MD  NEEDLE, DISP, 22 G (BD DISP NEEDLES) 22G X 1-1/2" MISC use as directed 08/04/20   Fermin Schwab, MD  Nirmatrelvir & Ritonavir (PAXLOVID) 20 x 150 MG & 10 x 100MG  TBPK Take 3 tablets by mouth 2 times a day for 5 days 08/30/20     pentoxifylline (TRENTAL) 400 MG CR tablet 1 PO TID 08/30/20     progesterone 50 MG/ML injection Inject 1cc into the muscle daily  08/04/20   08/06/20, MD  progesterone 50 MG/ML injection Inject 74ml into the muscle daily 08/29/20     progesterone 50 MG/ML injection Inject 1 ml into the muscle daily. Patient not taking: Reported on 12/19/2020 10/05/20     SYRINGE-NEEDLE, DISP, 3 ML (B-D 3CC LUER-LOK SYR 18GX1-1/2) 18G X 1-1/2" 3 ML MISC use as directed 08/04/20   08/06/20, MD  SYRINGE-NEEDLE, DISP, 3 ML (B-D 3CC LUER-LOK SYR 18GX1-1/2) 18G X 1-1/2" 3 ML MISC Use as directed 10/05/20     vitamin E (VITAMIN E-400) 180 MG (400 UNITS) capsule 1 PO QD 08/30/20  Physical Exam BP 122/70   Wt 155 lb (70.3 kg)   LMP 10/07/2020 (Exact Date)   BMI 25.02 kg/m   Physical Exam Constitutional:      General: She is not in acute distress.    Appearance: Normal appearance. She is well-developed.  HENT:     Head: Normocephalic and atraumatic.  Eyes:     General: No scleral icterus.    Conjunctiva/sclera: Conjunctivae normal.  Cardiovascular:     Rate and Rhythm: Normal rate and regular rhythm.     Heart sounds: No murmur heard.   No friction rub. No gallop.  Pulmonary:     Effort: Pulmonary effort is normal. No respiratory distress.     Breath sounds: Normal breath sounds. No wheezing or rales.  Abdominal:     General: Bowel sounds are normal. There is no distension.     Palpations: Abdomen is soft. There is no mass.     Tenderness: There is no abdominal tenderness. There is no guarding or rebound.  Musculoskeletal:        General: Normal range of motion.     Cervical back: Normal range of motion and neck supple.  Neurological:     General: No focal deficit present.     Mental Status: She is alert and oriented to person, place, and time.     Cranial Nerves: No cranial nerve deficit.  Skin:    General: Skin is warm and dry.     Findings: No erythema.  Psychiatric:        Mood and Affect: Mood normal.        Behavior: Behavior normal.        Judgment: Judgment normal.      Female Chaperone present  during breast and/or pelvic exam.  Assessment: 35 y.o. G2P0100 at [redacted]w[redacted]d presenting to initiate prenatal care  Plan: 1) Avoid alcoholic beverages. 2) Patient encouraged not to smoke.  3) Discontinue the use of all non-medicinal drugs and chemicals.  4) Take prenatal vitamins daily.  5) Nutrition, food safety (fish, cheese advisories, and high nitrite foods) and exercise discussed. 6) Hospital and practice style discussed with cross coverage system.  7) Genetic Screening, such as with 1st Trimester Screening, cell free fetal DNA, AFP testing, and Ultrasound, as well as with amniocentesis and CVS as appropriate, is discussed with patient. At the conclusion of today's visit patient requested genetic testing 8) Patient is asked about travel to areas at risk for the Zika virus, and counseled to avoid travel and exposure to mosquitoes or sexual partners who may have themselves been exposed to the virus. Testing is discussed, and will be ordered as appropriate.   Cervical length ultrasound (what order) [facility] referral   Thomasene Mohair, MD 12/19/2020 3:18 PM

## 2020-12-20 LAB — RPR+RH+ABO+RUB AB+AB SCR+CB...
Antibody Screen: NEGATIVE
HIV Screen 4th Generation wRfx: NONREACTIVE
Hematocrit: 38.9 % (ref 34.0–46.6)
Hemoglobin: 13.2 g/dL (ref 11.1–15.9)
Hepatitis B Surface Ag: NEGATIVE
MCH: 31.7 pg (ref 26.6–33.0)
MCHC: 33.9 g/dL (ref 31.5–35.7)
MCV: 93 fL (ref 79–97)
Platelets: 203 10*3/uL (ref 150–450)
RBC: 4.17 x10E6/uL (ref 3.77–5.28)
RDW: 12.8 % (ref 11.7–15.4)
RPR Ser Ql: NONREACTIVE
Rh Factor: POSITIVE
Rubella Antibodies, IGG: 6.59 index (ref >0.99)
Varicella zoster IgG: 894 index (ref >165)
WBC: 7.3 10*3/uL (ref 3.4–10.8)

## 2020-12-20 LAB — HEMOGLOBIN A1C
Est. average glucose Bld gHb Est-mCnc: 103 mg/dL
Hgb A1c MFr Bld: 5.2 % (ref 4.8–5.6)

## 2020-12-21 ENCOUNTER — Inpatient Hospital Stay: Payer: 59

## 2020-12-21 ENCOUNTER — Inpatient Hospital Stay: Payer: 59 | Attending: Oncology | Admitting: Licensed Clinical Social Worker

## 2020-12-21 ENCOUNTER — Other Ambulatory Visit: Payer: Self-pay

## 2020-12-21 ENCOUNTER — Encounter: Payer: Self-pay | Admitting: Licensed Clinical Social Worker

## 2020-12-21 DIAGNOSIS — Z8481 Family history of carrier of genetic disease: Secondary | ICD-10-CM | POA: Insufficient documentation

## 2020-12-21 DIAGNOSIS — Z803 Family history of malignant neoplasm of breast: Secondary | ICD-10-CM | POA: Insufficient documentation

## 2020-12-21 NOTE — Progress Notes (Signed)
REFERRING PROVIDER: Self-referred  PRIMARY PROVIDER:  Leone Haven, MD  PRIMARY REASON FOR VISIT:  1. Family history of breast cancer   2. Family history of gene mutation      HISTORY OF PRESENT ILLNESS:   Tanya Glenn, a 35 y.o. female, was seen for a [facility] cancer genetics consultation at the request of Dr. No ref. provider found due to a family history of a BRIP1 gene mutation.  Tanya Glenn presents to clinic today to discuss the possibility of a hereditary predisposition to cancer, genetic testing, and to further clarify her future cancer risks, as well as potential cancer risks for family members.   Tanya Glenn is a 35 y.o. female with no personal history of cancer.    CANCER HISTORY:  Oncology History   No history exists.     RISK FACTORS:  Menarche was at age 43.  First live birth at age: currently pregnant  Ovaries intact: yes.  Hysterectomy: no.  Menopausal status: premenopausal.  HRT use: 0 years. Colonoscopy: n/a; not examined. Mammogram within the last year: n/a. Number of breast biopsies: 0. Up to date with pelvic exams: yes. Any excessive radiation exposure in the past: no  Past Medical History:  Diagnosis Date   Asthma    Asthma    Family history of breast cancer    Family history of gene mutation    Narcolepsy    Symptomatic PVCs     Past Surgical History:  Procedure Laterality Date   WISDOM TOOTH EXTRACTION      Social History   Socioeconomic History   Marital status: Married    Spouse name: Gwenyth Bouillon   Number of children: Not on file   Years of education: Not on file   Highest education level: Not on file  Occupational History   Occupation: Pharmacist  Tobacco Use   Smoking status: Never   Smokeless tobacco: Never  Vaping Use   Vaping Use: Never used  Substance and Sexual Activity   Alcohol use: Yes    Comment: occasional drink on the weekend.   Drug use: No   Sexual activity: Yes    Birth control/protection: None   Other Topics Concern   Not on file  Social History Narrative   Lives in [place].  Exercises - wt training - 4+ x /wk.     Social Determinants of Health   Financial Resource Strain: Not on file  Food Insecurity: Not on file  Transportation Needs: Not on file  Physical Activity: Not on file  Stress: Not on file  Social Connections: Not on file     FAMILY HISTORY:  We obtained a detailed, 4-generation family history.  Significant diagnoses are listed below: Family History  Problem Relation Age of Onset   Breast cancer Mother 74       triple negative, BRIP1+   Hyperlipidemia Father    Skin cancer Maternal Aunt    Heart disease Maternal Grandmother    Stroke Maternal Grandmother    Tanya Glenn has 1 sister, 61, no history of cancer.   Tanya Glenn mother was recently diagnosed with triple negative breast cancer at 84 and tested positive a BRIP1 pathogenic mutation. Patient has 2 maternal uncles, 3 maternal aunts. One aunt had skin cancer. No cancers in cousins, no cancers in maternal grandparents.  Tanya Glenn father is 40, no history of cancer. Patient has 2 paternal uncles, 1 aunt, no cancer in these individuals or in cousins or grandparents.  Tanya Glenn is  aware of previous family history of genetic testing for hereditary cancer risks. Patient's maternal ancestors are of unknown descent, and paternal ancestors are of [place] descent. There is no reported Ashkenazi Jewish ancestry. There is no known consanguinity.  Tanya Glenn is aware of previous family history of genetic testing for hereditary cancer risks. Patient's maternal ancestors are of unknown descent, and paternal ancestors are of [place] descent. There is no reported Ashkenazi Jewish ancestry. There is no known consanguinity.    GENETIC COUNSELING ASSESSMENT: Tanya Glenn is a 35 y.o. female with a  family history of a BRIP1 pathogenic mutation. We, therefore, discussed and recommended the following at today's visit.    DISCUSSION: We discussed that approximately 10% of cancer is hereditary. Most cases of hereditary breast cancer are associated with BRCA1/BRCA2 genes, although there are other genes associated with hereditary breast cancer as well. Cancers and risks are gene specific. We discussed the BRIP1 gene in particular, noting that it is mainly associated with an increased risk for ovarian cancer. We can look at the BRIP1 gene alone, or a full panel of genes.   We discussed that testing is beneficial for several reasons including knowing about cancer risks, identifying potential screening and risk-reduction options that may be appropriate, and to understand if other family members could be at risk for cancer and allow them to undergo genetic testing.   We reviewed the characteristics, features and inheritance patterns of hereditary cancer syndromes. We also discussed genetic testing, including the appropriate family members to test, the process of testing, insurance coverage and turn-around-time for results. We discussed the implications of a negative, positive and/or variant of uncertain significant result. We recommended Tanya Glenn pursue genetic testing for the [employer] CancerNext-Expanded+RNA gene panel.   The CancerNext-Expanded + RNAinsight gene panel offered by [employer] and includes sequencing and rearrangement analysis for the following 77 genes: IP, ALK, APC*, ATM*, AXIN2, BAP1, BARD1, BLM, BMPR1A, BRCA1*, BRCA2*, BRIP1*, CDC73, CDH1*,CDK4, CDKN1B, CDKN2A, CHEK2*, CTNNA1, DICER1, FANCC, FH, FLCN, GALNT12, KIF1B, LZTR1, MAX, MEN1, MET, MLH1*, MSH2*, MSH3, MSH6*, MUTYH*, NBN, NF1*, NF2, NTHL1, PALB2*, PHOX2B, PMS2*, POT1, PRKAR1A, PTCH1, PTEN*, RAD51C*, RAD51D*,RB1, RECQL, RET, SDHA, SDHAF2, SDHB, SDHC, SDHD, SMAD4, SMARCA4, SMARCB1, SMARCE1, STK11, SUFU, TMEM127, TP53*,TSC1, TSC2, VHL and XRCC2 (sequencing and deletion/duplication); EGFR, EGLN1, HOXB13, KIT, MITF, PDGFRA, POLD1 and POLE (sequencing only);  EPCAM and GREM1 (deletion/duplication only).  Based on Tanya Glenn's family history of cancer, she meets medical criteria for genetic testing. Despite that she meets criteria, she may still have an out of pocket cost. We discussed that if her out of pocket cost for testing is over $100, the laboratory will call and confirm whether she wants to proceed with testing.  If the out of pocket cost of testing is less than $100 she will be billed by the genetic testing laboratory.   We discussed that some people do not want to undergo genetic testing due to fear of genetic discrimination.  A federal law called the Genetic Information Non-Discrimination Act (GINA) of 2008 helps protect individuals against genetic discrimination based on their genetic test results.  It impacts both health insurance and employment.  For health insurance, it protects against increased premiums, being kicked off insurance or being forced to take a test in order to be insured.  For employment it protects against hiring, firing and promoting decisions based on genetic test results.  Health status due to a cancer diagnosis is not protected under GINA.  This law does not protect life insurance,  disability insurance, or other types of insurance.   PLAN: After considering the risks, benefits, and limitations, Tanya Glenn provided informed consent to pursue genetic testing and the blood sample was sent to [facility] for analysis of the CancerNext-Expanded+RNA panel. Results should be available within approximately 2-3 weeks' time, at which point they will be disclosed by telephone to Tanya Glenn, as will any additional recommendations warranted by these results. Tanya Glenn will receive a summary of her genetic counseling visit and a copy of her results once available. This information will also be available in Epic.   Tanya Glenn questions were answered to her satisfaction today. Our contact information was provided should additional  questions or concerns arise. Thank you for the referral and allowing [place] to share in the care of your patient.   Faith Rogue, MS, [facility] Genetic Counselor [email or web address] Phone: [phone]  The patient was seen for a total of 25 minutes in face-to-face genetic counseling.  Patient was seen with her sister, Alfonso Ramus. Dr. Grayland Ormond was available for discussion regarding this case.   _______________________________________________________________________ For Office Staff:  Number of people involved in session: 2 Was an Intern/ student involved with case: no

## 2020-12-22 ENCOUNTER — Encounter: Payer: Self-pay | Admitting: Obstetrics and Gynecology

## 2020-12-23 LAB — URINE CULTURE

## 2020-12-24 LAB — MATERNIT 21 PLUS CORE, BLOOD
Fetal Fraction: 7
Result (T21): NEGATIVE
Trisomy 13 (Patau syndrome): NEGATIVE
Trisomy 18 (Edwards syndrome): NEGATIVE
Trisomy 21 (Down syndrome): NEGATIVE

## 2021-01-02 DIAGNOSIS — O09299 Supervision of pregnancy with other poor reproductive or obstetric history, unspecified trimester: Secondary | ICD-10-CM | POA: Insufficient documentation

## 2021-01-02 DIAGNOSIS — O09522 Supervision of elderly multigravida, second trimester: Secondary | ICD-10-CM | POA: Diagnosis not present

## 2021-01-02 DIAGNOSIS — O3680X0 Pregnancy with inconclusive fetal viability, not applicable or unspecified: Secondary | ICD-10-CM | POA: Diagnosis not present

## 2021-01-02 DIAGNOSIS — Z3A12 12 weeks gestation of pregnancy: Secondary | ICD-10-CM | POA: Diagnosis not present

## 2021-01-02 DIAGNOSIS — O0991 Supervision of high risk pregnancy, unspecified, first trimester: Secondary | ICD-10-CM | POA: Diagnosis not present

## 2021-01-02 DIAGNOSIS — O09529 Supervision of elderly multigravida, unspecified trimester: Secondary | ICD-10-CM | POA: Diagnosis not present

## 2021-01-03 ENCOUNTER — Ambulatory Visit: Payer: 59

## 2021-01-03 ENCOUNTER — Other Ambulatory Visit: Payer: Self-pay

## 2021-01-04 ENCOUNTER — Telehealth: Payer: Self-pay | Admitting: Licensed Clinical Social Worker

## 2021-01-04 ENCOUNTER — Ambulatory Visit: Payer: Self-pay | Admitting: Licensed Clinical Social Worker

## 2021-01-04 ENCOUNTER — Encounter: Payer: Self-pay | Admitting: Obstetrics and Gynecology

## 2021-01-04 ENCOUNTER — Ambulatory Visit (INDEPENDENT_AMBULATORY_CARE_PROVIDER_SITE_OTHER): Payer: 59 | Admitting: Obstetrics and Gynecology

## 2021-01-04 ENCOUNTER — Other Ambulatory Visit: Payer: Self-pay

## 2021-01-04 ENCOUNTER — Encounter: Payer: Self-pay | Admitting: Licensed Clinical Social Worker

## 2021-01-04 VITALS — BP 126/80 | Wt 157.0 lb

## 2021-01-04 DIAGNOSIS — O99519 Diseases of the respiratory system complicating pregnancy, unspecified trimester: Secondary | ICD-10-CM

## 2021-01-04 DIAGNOSIS — O0990 Supervision of high risk pregnancy, unspecified, unspecified trimester: Secondary | ICD-10-CM

## 2021-01-04 DIAGNOSIS — Z1379 Encounter for other screening for genetic and chromosomal anomalies: Secondary | ICD-10-CM | POA: Insufficient documentation

## 2021-01-04 DIAGNOSIS — Z1501 Genetic susceptibility to malignant neoplasm of breast: Secondary | ICD-10-CM | POA: Insufficient documentation

## 2021-01-04 DIAGNOSIS — O09299 Supervision of pregnancy with other poor reproductive or obstetric history, unspecified trimester: Secondary | ICD-10-CM

## 2021-01-04 DIAGNOSIS — Z8481 Family history of carrier of genetic disease: Secondary | ICD-10-CM

## 2021-01-04 DIAGNOSIS — R760 Raised antibody titer: Secondary | ICD-10-CM

## 2021-01-04 DIAGNOSIS — O09819 Supervision of pregnancy resulting from assisted reproductive technology, unspecified trimester: Secondary | ICD-10-CM

## 2021-01-04 DIAGNOSIS — Z803 Family history of malignant neoplasm of breast: Secondary | ICD-10-CM

## 2021-01-04 DIAGNOSIS — O09529 Supervision of elderly multigravida, unspecified trimester: Secondary | ICD-10-CM

## 2021-01-04 DIAGNOSIS — J45909 Unspecified asthma, uncomplicated: Secondary | ICD-10-CM

## 2021-01-04 NOTE — Telephone Encounter (Signed)
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 The remainder of testing was negative/normal. Patient would like referral to our high risk clinic here at the cancer center.  

## 2021-01-04 NOTE — Progress Notes (Signed)
HPI:  Tanya Glenn was previously seen in the [facility] clinic due to a family history of breast cancer, her mother's recent genetic testing that revealed a BRIP1 pathogenic mutation, and concerns regarding a hereditary predisposition to cancer. Please refer to our prior cancer genetics clinic note for more information regarding our discussion, assessment and recommendations, at the time. Ms. Tanya Glenn recent genetic test results were disclosed to her, as were recommendations warranted by these results. These results and recommendations are discussed in more detail below.  CANCER HISTORY:  Oncology History   No history exists.    FAMILY HISTORY:  We obtained a detailed, 4-generation family history.  Significant diagnoses are listed below: Family History  Problem Relation Age of Onset   Breast cancer Mother 58       triple negative, BRIP1+   Hyperlipidemia Father    Skin cancer Maternal Aunt    Heart disease Maternal Grandmother    Stroke Maternal Grandmother    Ms. Tanya Glenn has 1 sister, 36, no history of cancer.    Ms. Tanya Glenn mother was recently diagnosed with triple negative breast cancer at 36 and tested positive a BRIP1 pathogenic mutation. Patient has 2 maternal uncles, 3 maternal aunts. One aunt had skin cancer. No cancers in cousins, no cancers in maternal grandparents.   Ms. Tanya Glenn father is 65, no history of cancer. Patient has 2 paternal uncles, 1 aunt, no cancer in these individuals or in cousins or grandparents.   Ms. Tanya Glenn is aware of previous family history of genetic testing for hereditary cancer risks. Patient's maternal ancestors are of unknown descent, and paternal ancestors are of [place] descent. There is no reported Ashkenazi Jewish ancestry. There is no known consanguinity.   Ms. Tanya Glenn is aware of previous family history of genetic testing for hereditary cancer risks. Patient's maternal ancestors are of unknown descent, and paternal ancestors  are of [place] descent. There is no reported Ashkenazi Jewish ancestry. There is no known consanguinity.      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 The remainder of testing was negative.  The CancerNext-Expanded + RNAinsight gene panel offered by [employer] and includes sequencing and rearrangement analysis for the following 77 genes: IP, ALK, APC*, ATM*, AXIN2, BAP1, BARD1, BLM, BMPR1A, BRCA1*, BRCA2*, BRIP1*, CDC73, CDH1*,CDK4, CDKN1B, CDKN2A, CHEK2*, CTNNA1, DICER1, FANCC, FH, FLCN, GALNT12, KIF1B, LZTR1, MAX, MEN1, MET, MLH1*, MSH2*, MSH3, MSH6*, MUTYH*, NBN, NF1*, NF2, NTHL1, PALB2*, PHOX2B, PMS2*, POT1, PRKAR1A, PTCH1, PTEN*, RAD51C*, RAD51D*,RB1, RECQL, RET, SDHA, SDHAF2, SDHB, SDHC, SDHD, SMAD4, SMARCA4, SMARCB1, SMARCE1, STK11, SUFU, TMEM127, TP53*,TSC1, TSC2, VHL and XRCC2 (sequencing and deletion/duplication); EGFR, EGLN1, HOXB13, KIT, MITF, PDGFRA, POLD1 and POLE (sequencing only); EPCAM and GREM1 (deletion/duplication only).  The test report has been scanned into EPIC and is located under the Molecular Pathology section of the Results Review tab.  A portion of the result report is included below for reference.     BRIP1   Clinical condition Women who are carriers of a single pathogenic BRIP1 variant have an increased risk of ovarian and breast cancer. The exact risk figures for breast cancer have yet to be determined; however, studies suggest the risk of ovarian cancer is approximately 8% and may be up to 15% (PMID: 09628366, 29476546, 50354656, 81275170). An individual with a BRIP1 pathogenic variant will not necessarily develop cancer in their lifetime, but the risk for cancer is increased over the  general population risk.    Inheritance Hereditary predisposition to cancer due to a single pathogenic variant in the BRIP1 gene has autosomal  dominant inheritance. This means that an individual with a pathogenic variant has a 50% chance of passing the condition on to their offspring. Once Tanya Glenn a variant is detected in an individual, it is possible to identify at-risk relatives who can pursue testing for this specific familial variant. Many cases are inherited from a parent, but some cases can occur spontaneously (i.e., an individual with a pathogenic variant has parents who do not have it).    Additionally, individuals with a pathogenic variant in BRIP1 are carriers of Fanconi anemia type J. Fanconi anemia is an autosomal recessive disorder that is characterized by bone marrow failure and variable presentation of anomalies, including short stature, abnormal skin pigmentation, abnormal thumbs, malformations of the skeletal and central nervous systems, and developmental delay (PMID: 3557322, 02542706). Risks for leukemia and early onset solid tumors are significantly elevated (PMID: 23762831, 51761607, 37106269). For there to be a risk of Fanconi anemia in offspring, both parents would each have to have a single pathogenic variant in [place]; in Tanya Glenn a case, the risk of having an affected child is 25%.   Management The [place] (NCCN) recommends consideration of prophylactic salpingo-oophorectomy (surgical removal of the ovaries and fallopian tubes) for women with a pathogenic variant in [place] after childbearing is complete (NCCN v.1.2023), ages 68-50 or earlier based on family history. The current NCCN guidelines do not recommend additional breast cancer screening for individuals with a single pathogenic BRIP1 variant beyond what is recommended for the general population. However, they caution that cancer screening should ultimately be guided by personal and family history (NCCN v.1.2023).   An individual's cancer risk and medical management are not determined by genetic test results alone. Overall cancer risk assessment  incorporates additional factors, including personal medical history, family history, and any available genetic information that may result in a personalized plan for cancer prevention and surveillance.   Even though data regarding BRIP1 is preliminary, knowing if a pathogenic variant is present is advantageous. At-risk relatives can be identified, enabling pursuit of a diagnostic evaluation. Further, the available information regarding hereditary cancer susceptibility genes is constantly evolving and more clinically relevant data regarding BRIP1 are likely to become available in the near future. Awareness of this cancer predisposition encourages patients and their providers to inform at-risk family members, to diligently follow standard screening protocols, and to be vigilant in maintaining close and regular contact with their local genetics clinic in anticipation of new information.    Additionally, since we are not sure how much the BRIP1 mutation contributed to her mother's breast cancer, risk model CanRisk was used to estimate her risk of developing breast cancer. This estimates her lifetime risk of developing breast cancer to be approximately 20.8%.  The patient's lifetime breast cancer risk is a preliminary estimate based on available information using one of several models endorsed by the [employer]  (NCCN). The NCCN recommends consideration of breast MRI screening as an adjunct to mammography for patients at high risk (defined as 20% or greater lifetime risk).  This risk estimate can change over time and may be repeated to reflect new information in her personal or family history in the future.   Ms. Shook has been determined to be at high risk for breast cancer. For women with a greater than 20% lifetime risk of breast cancer, the [employer]  (  NCCN) recommends the following:   1.      Clinical encounter every 6-12 months to begin when identified  as being at increased risk, but not before age 47  2.      Annual mammograms. Tomosynthesis is recommended starting 10 years earlier than the youngest breast cancer diagnosis in the family or at age 35 (whichever comes first), but not before age 41    3.      Annual breast MRI starting 10 years earlier than the youngest breast cancer diagnosis in the family or at age 44 (whichever comes first), but not before age 1   FAMILY MEMBERS: It is important that all of Ms. Hardman's relatives (both men and women) know of the presence of this gene mutation. Site-specific genetic testing can sort out who in the family is at risk and who is not.    Ms. Eshbach children and siblings have a 50% chance to have inherited this mutation. We recommend they have genetic testing for this same mutation, as identifying the presence of this mutation would allow them to also take advantage of risk-reducing measures.    PLAN:   1. These results will be made available to  her care team. She would like a referral to the high risk clinic.   2. Ms. Essex plans to discuss these results with her family and will reach out to [place] if we can be of any assistance in coordinating genetic testing for any of her relatives.    SUPPORT AND RESOURCES: If Ms. Lehtinen is interested in information and support, there are two groups, Facing Our Risk (www.facingourrisk.com) and Bright Pink (www.brightpink.org) which some people have found useful. They provide opportunities to speak with other individuals from high-risk families. To locate genetic counselors in other cities, visit the website of the [employer] (ArtistMovie.se) and Secretary/administrator for a Social worker by zip code.   We encourage Ms. Sprankle to remain in contact with [place] on an annual basis so we can update her personal and family histories, and let her know of advances in cancer genetics that may benefit the family. Our contact number was provided. Ms. Nickolson  questions were answered to her satisfaction today, and she knows she is welcome to call anytime with additional questions.    Faith Rogue, MS, [facility] Genetic Counselor [email or web address] Phone: [phone]

## 2021-01-04 NOTE — Progress Notes (Signed)
Routine Prenatal Care Visit  Subjective  Tanya Glenn is a 35 y.o. G2P0100 at 59w1dbeing seen today for ongoing prenatal care.  She is currently monitored for the following issues for this high-risk pregnancy and has Leg pain, right; Psoriasis of scalp; Anxiety and depression; Asthma, mild intermittent; Palpitations; Hypokalemia; Narcolepsy; BPPV (benign paroxysmal positional vertigo); GERD (gastroesophageal reflux disease); Lipid screening; Dizziness; Supervision of high risk pregnancy, antepartum; Encounter for supervision of pregnancy resulting from assisted reproductive technology; Antepartum multigravida of advanced maternal age; Asthma during pregnancy; Low TSH level; Anticardiolipin antibody positive; Thrombocytopenia ([place]; Varicose veins of leg with swelling, right; Prior pregnancy with fetal demise; Hypothyroid in pregnancy, antepartum; Family history of breast cancer; Family history of gene mutation; Genetic testing; and Monoallelic mutation of BRIP1 gene on their problem list.  ----------------------------------------------------------------------------------- Patient reports LLQ abdominal pain that has persisted. She had a visit with [facility] yesterday for a nursing visit and ultrasound. The ultrasound did not reveal an etiology for her pain. She did have microscopic hematuria for which she awaiting the results of a urine culture.      . Vag. Bleeding: None.   . Leaking Fluid denies.  ----------------------------------------------------------------------------------- The following portions of the patient's history were reviewed and updated as appropriate: allergies, current medications, past family history, past medical history, past social history, past surgical history and problem list. Problem list updated.  Objective  Blood pressure 126/80, weight 157 lb (71.2 kg), last menstrual period 10/07/2020. Pregravid weight 155 lb (70.3 kg) Total Weight Gain 2 lb (0.907 kg) Urinalysis:  Urine Protein    Urine Glucose    Fetal Status: Fetal Heart Rate (bpm): present (normal)         General:  Alert, oriented and cooperative. Patient is in no acute distress.  Skin: Skin is warm and dry. No rash noted.   Cardiovascular: Normal heart rate noted  Respiratory: Normal respiratory effort, no problems with respiration noted  Abdomen: Soft, gravid, appropriate for gestational age.       Pelvic:  Cervical exam deferred        Extremities: Normal range of motion.     Mental Status: Normal mood and affect. Normal behavior. Normal judgment and thought content.   Bedside u/s shows single, living IUP with normal FCA and +FM.  LLQ appears normal from this limited ultrasound.   Assessment   35y.o. G2P0100 at 17w1dy  07/12/2021, by Embryo Transfer presenting for routine prenatal visit  Plan   pregnancy Problems (from 12/19/20 to present)     Problem Noted Resolved   Prior pregnancy with fetal demise 12/19/2020 by JaWill BonnetMD No   Overview Signed 12/19/2020  3:15 PM by JaWill BonnetMD    At 28 weeks, ?nuchal cord. No other etiology found, ? Elevated anticardiolipin abs      Hypothyroid in pregnancy, antepartum 12/19/2020 by JaWill BonnetMD No   Overview Signed 12/19/2020  3:44 PM by JaWill BonnetMD    - on levothyroxine 50 mcg - [facility]    Supervision of high risk pregnancy, antepartum 01/04/2020 by JaWill BonnetMD No   Overview Addendum 12/19/2020  3:12 PM by JaWill BonnetMD    Clinic [facility] Prenatal Labs  Dating  Blood type: O/Positive/-- (11/15 1455)   Genetic Screen NIPS: Antibody:Negative (11/15 1455)  Anatomic USKoreaRubella: 8.19 (11/15 1455)  Varicella: immune  GTT Early:  Third trimester:  RPR: Non Reactive (11/15 1455)   Rhogam  HBsAg: Negative (11/15 1455)   TDaP vaccine                       Flu Shot: HIV: Non Reactive (11/15 1455)   Baby Food                                GBS:    Contraception  Pap: up to date, normal  CBB     CS/VBAC    Support Person Husband: Jovita Kussmaul           Antepartum multigravida of advanced maternal age 79/15/2021 by Will Bonnet, MD No   Overview Addendum 01/08/2020 12:32 PM by Will Bonnet, MD    _0  NIPS testing pending, Euploid on PGS      Asthma during pregnancy 01/04/2020 by Will Bonnet, MD No        Preterm labor symptoms and general obstetric precautions including but not limited to vaginal bleeding, contractions, leaking of fluid and fetal movement were reviewed in detail with the patient. Please refer to After Visit Summary for other counseling recommendations.   - Continue follow up with [facility] for [facility] consultation regarding outcome of her prior pregnancy and finding of +ACLA.  She did not meet criteria for APLAS.  She is taking lovenox at this point as recommended by her REI.   - LLQ pain. No obvious etiology. Cervix appears closed on transabdominal u/s.  Patient declines pelvic exam today.   - She also has an anatomy u/s with [facility], which I encouraged her to keep.    Return in about 4 weeks (around 02/01/2021) for ROB (w AMS).   Prentice Docker, MD, [facility], [facility] 01/05/2021 8:36 AM

## 2021-01-09 ENCOUNTER — Other Ambulatory Visit: Payer: Self-pay

## 2021-01-09 ENCOUNTER — Other Ambulatory Visit: Payer: Self-pay | Admitting: Obstetrics and Gynecology

## 2021-01-09 ENCOUNTER — Ambulatory Visit (INDEPENDENT_AMBULATORY_CARE_PROVIDER_SITE_OTHER): Payer: 59

## 2021-01-09 ENCOUNTER — Encounter: Payer: Self-pay | Admitting: Obstetrics and Gynecology

## 2021-01-09 DIAGNOSIS — R1032 Left lower quadrant pain: Secondary | ICD-10-CM | POA: Diagnosis not present

## 2021-01-09 DIAGNOSIS — O26892 Other specified pregnancy related conditions, second trimester: Secondary | ICD-10-CM | POA: Diagnosis not present

## 2021-01-09 LAB — POCT URINALYSIS DIPSTICK
Bilirubin, UA: NEGATIVE
Blood, UA: NEGATIVE
Glucose, UA: NEGATIVE
Ketones, UA: NEGATIVE
Nitrite, UA: NEGATIVE
Protein, UA: POSITIVE — AB
Spec Grav, UA: >=1.03 — AB (ref 1.010–1.025)
Urobilinogen, UA: NEGATIVE E.U./dL — AB
pH, UA: 5 (ref 5.0–8.0)

## 2021-01-09 NOTE — Progress Notes (Signed)
Pt here for urine drop off; see pt msg from today; see u/a. Will send to provider for further orders.

## 2021-01-10 ENCOUNTER — Other Ambulatory Visit: Payer: Self-pay

## 2021-01-11 DIAGNOSIS — E063 Autoimmune thyroiditis: Secondary | ICD-10-CM | POA: Diagnosis not present

## 2021-01-11 DIAGNOSIS — E038 Other specified hypothyroidism: Secondary | ICD-10-CM | POA: Diagnosis not present

## 2021-01-13 LAB — URINE CULTURE

## 2021-01-17 ENCOUNTER — Encounter: Payer: 59 | Admitting: Obstetrics and Gynecology

## 2021-01-17 DIAGNOSIS — O09819 Supervision of pregnancy resulting from assisted reproductive technology, unspecified trimester: Secondary | ICD-10-CM | POA: Insufficient documentation

## 2021-01-17 DIAGNOSIS — Z8759 Personal history of other complications of pregnancy, childbirth and the puerperium: Secondary | ICD-10-CM | POA: Diagnosis not present

## 2021-01-17 DIAGNOSIS — J4520 Mild intermittent asthma, uncomplicated: Secondary | ICD-10-CM | POA: Diagnosis not present

## 2021-01-17 DIAGNOSIS — O0992 Supervision of high risk pregnancy, unspecified, second trimester: Secondary | ICD-10-CM | POA: Diagnosis not present

## 2021-01-24 ENCOUNTER — Other Ambulatory Visit: Payer: Self-pay

## 2021-01-24 ENCOUNTER — Encounter: Payer: Self-pay | Admitting: Oncology

## 2021-01-24 ENCOUNTER — Inpatient Hospital Stay: Payer: 59 | Attending: Oncology | Admitting: Oncology

## 2021-01-24 ENCOUNTER — Inpatient Hospital Stay: Payer: 59

## 2021-01-24 VITALS — BP 114/69 | HR 92 | Temp 97.2°F | Resp 16 | Ht 64.5 in | Wt 162.8 lb

## 2021-01-24 DIAGNOSIS — Z803 Family history of malignant neoplasm of breast: Secondary | ICD-10-CM | POA: Diagnosis not present

## 2021-01-24 DIAGNOSIS — Z808 Family history of malignant neoplasm of other organs or systems: Secondary | ICD-10-CM

## 2021-01-24 DIAGNOSIS — Z1502 Genetic susceptibility to malignant neoplasm of ovary: Secondary | ICD-10-CM

## 2021-01-24 DIAGNOSIS — Z1501 Genetic susceptibility to malignant neoplasm of breast: Secondary | ICD-10-CM | POA: Diagnosis not present

## 2021-01-24 DIAGNOSIS — Z1509 Genetic susceptibility to other malignant neoplasm: Secondary | ICD-10-CM | POA: Diagnosis not present

## 2021-01-24 DIAGNOSIS — Z1589 Genetic susceptibility to other disease: Secondary | ICD-10-CM

## 2021-01-25 NOTE — Progress Notes (Signed)
Hematology/Oncology Consult note [facility] [phone] [phone]  Patient Care Team: Leone Haven, MD as PCP - General (Family Medicine) Garwin Brothers, NT as Technician   Name of the patient: Tanya Glenn  [id number]  01/25/1986    Reason for referral-increased risk of breast and ovarian cancer   Referring provider: Faith Rogue  Date of visit: 01/25/21   History of presenting illness-patient is a 35 year old female whose mother was found to have triple negative breast cancer at the age of 46 and subsequently diagnosed with BRIP1 gene mutation.  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  Patient has been seen by genetic counseling and given her increased risk of ovarian cancer and possible breast cancer she has been referred to high risk clinic at this time.  Patient is currently [redacted] weeks pregnant through IVF G3, P0 L0. She has had problems conceiving. She had a still birth with previous pregnancy. Menarche at the age of 54.  She has used birth control in the past for 18 years.  No prior breast biopsies or mammograms.She has 1 sister who is 40 and no history of cancer  ECOG PS- 0  Pain scale- 0   Review of systems- Review of Systems  Constitutional:  Negative for chills, fever, malaise/fatigue and weight loss.  HENT:  Negative for congestion, ear discharge and nosebleeds.   Eyes:  Negative for blurred vision.  Respiratory:  Negative for cough, hemoptysis, sputum production, shortness of breath and wheezing.   Cardiovascular:  Negative for chest pain, palpitations, orthopnea and claudication.  Gastrointestinal:  Negative for abdominal pain, blood in stool, constipation, diarrhea, heartburn, melena, nausea and vomiting.  Genitourinary:  Negative for dysuria, flank pain, frequency, hematuria and urgency.  Musculoskeletal:  Negative for back pain, joint  pain and myalgias.  Skin:  Negative for rash.  Neurological:  Negative for dizziness, tingling, focal weakness, seizures, weakness and headaches.  Endo/Heme/Allergies:  Does not bruise/bleed easily.  Psychiatric/Behavioral:  Negative for depression and suicidal ideas. The patient does not have insomnia.    Allergies  Allergen Reactions   Sulfa Antibiotics Hives    Patient Active Problem List   Diagnosis Date Noted   Genetic testing 80/04/4915   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   Family history of breast cancer 12/21/2020   Family history of gene mutation 12/21/2020   Prior pregnancy with fetal demise 12/19/2020   Hypothyroid in pregnancy, antepartum 12/19/2020   Varicose veins of leg with swelling, 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   Low TSH level 08/15/2020   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   Thrombocytopenia ([place]) 08/15/2020   Supervision of high risk pregnancy, antepartum 01/04/2020   Encounter for supervision of pregnancy resulting from assisted reproductive technology 01/04/2020   Antepartum multigravida of advanced maternal age 90/15/2021   Asthma during pregnancy 01/04/2020   Dizziness 09/01/2019   Lipid screening 04/29/2018   BPPV (benign paroxysmal positional vertigo) 06/26/2017   GERD (gastroesophageal reflux disease) 06/26/2017   Narcolepsy 12/21/2016   Palpitations 10/08/2016   Hypokalemia 10/08/2016   Asthma, mild intermittent 02/16/2016   Leg pain, right 09/26/2015   Anxiety and depression 02/05/2012   Psoriasis of scalp 03/01/2011     Past Medical History:  Diagnosis Date   Asthma    Asthma    Family history of breast cancer    Family history of gene mutation    Narcolepsy    Symptomatic PVCs  Past Surgical History:  Procedure Laterality Date   WISDOM TOOTH EXTRACTION      Social History   Socioeconomic History   Marital status: Married    Spouse name: Gwenyth Bouillon   Number of children: Not on file   Years of education: Not  on file   Highest education level: Not on file  Occupational History   Occupation: Pharmacist  Tobacco Use   Smoking status: Never   Smokeless tobacco: Never  Vaping Use   Vaping Use: Never used  Substance and Sexual Activity   Alcohol use: Yes    Comment: occasional drink on the weekend.   Drug use: No   Sexual activity: Yes    Birth control/protection: None  Other Topics Concern   Not on file  Social History Narrative   Lives in [place].  Exercises - wt training - 4+ x /wk.     Social Determinants of Health   Financial Resource Strain: Not on file  Food Insecurity: Not on file  Transportation Needs: Not on file  Physical Activity: Not on file  Stress: Not on file  Social Connections: Not on file  Intimate Partner Violence: Not on file     Family History  Problem Relation Age of Onset   Breast cancer Mother 46       triple negative, BRIP1+   Hyperlipidemia Father    Skin cancer Maternal Aunt    Heart disease Maternal Grandmother    Stroke Maternal Grandmother      Current Outpatient Medications:    albuterol (VENTOLIN HFA) 108 (90 Base) MCG/ACT inhaler, INHALE 1 PUFF BY MOUTH EVERY 4 HOURS AS NEEDED FOR WHEEZING OR SHORTNESS OF BREATH., Disp: 18 g, Rfl: 2   aspirin 81 MG chewable tablet, Chew by mouth daily., Disp: , Rfl:    beclomethasone (QVAR) 40 MCG/ACT inhaler, INHALE 2 PUFFS INTO THE LUNGS TWO TIMES DAILY, Disp: 10.6 g, Rfl: 2   levothyroxine (SYNTHROID) 50 MCG tablet, Take by mouth., Disp: , Rfl:    omega-3 acid ethyl esters (LOVAZA) 1 g capsule, Take by mouth 2 (two) times daily., Disp: , Rfl:    Prenatal MV-Min-Fe Fum-FA-DHA (PRENATAL 1 PO), Take by mouth., Disp: , Rfl:    estradiol (VIVELLE-DOT) 0.1 MG/24HR patch, Place 1 patch onto the skin every 3 days. (Patient not taking: Reported on 12/19/2020), Disp: 8 patch, Rfl: 3   heparin 5000 UNIT/ML injection, Inject into the skin. (Patient not taking: Reported on 01/24/2021), Disp: , Rfl:    medroxyPROGESTERone  (PROVERA) 10 MG tablet, Take 1 tablet (10 mg total) by mouth daily for 10 days. (Patient not taking: Reported on 01/24/2021), Disp: 10 tablet, Rfl: 0   NEEDLE, DISP, 22 G (BD DISP NEEDLES) 22G X 1-1/2" MISC, Use as directed, Disp: 30 each, Rfl: 2   NEEDLE, DISP, 30 G (BD DISP NEEDLES) 30G X 1/2" MISC, use as directed, Disp: 60 each, Rfl: 2   progesterone 50 MG/ML injection, Inject 1 ml into the muscle daily. (Patient not taking: Reported on 12/19/2020), Disp: 30 mL, Rfl: 2   SYRINGE-NEEDLE, DISP, 3 ML (B-D 3CC LUER-LOK SYR 18GX1-1/2) 18G X 1-1/2" 3 ML MISC, Use as directed (Patient not taking: Reported on 01/24/2021), Disp: 30 each, Rfl: 2   Physical exam:  Vitals:   01/24/21 1507  BP: 114/69  Pulse: 92  Resp: 16  Temp: (!) 97.2 F (36.2 C)  SpO2: 100%  Weight: 162 lb 12.8 oz (73.8 kg)  Height: 5' 4.5" (1.638 m)  Physical Exam Cardiovascular:     Rate and Rhythm: Normal rate and regular rhythm.     Heart sounds: Normal heart sounds.  Pulmonary:     Effort: Pulmonary effort is normal.     Breath sounds: Normal breath sounds.  Abdominal:     General: Bowel sounds are normal.     Palpations: Abdomen is soft.  Skin:    General: Skin is warm and dry.  Neurological:     Mental Status: She is alert and oriented to person, place, and time.       CMP Latest Ref Rng & Units 04/29/2018  Glucose 70 - 99 mg/dL 105(H)  BUN 6 - 23 mg/dL 19  Creatinine 0.40 - 1.20 mg/dL 1.03  Sodium 135 - 145 mEq/L 136  Potassium 3.5 - 5.1 mEq/L 4.0  Chloride 96 - 112 mEq/L 100  CO2 19 - 32 mEq/L 28  Calcium 8.4 - 10.5 mg/dL 9.5  Total Protein 6.0 - 8.3 g/dL 7.2  Total Bilirubin 0.2 - 1.2 mg/dL 0.4  Alkaline Phos 39 - 117 U/L 40  AST 0 - 37 U/L 22  ALT 0 - 35 U/L 23   CBC Latest Ref Rng & Units 12/19/2020  WBC 3.4 - 10.8 x10E3/uL 7.3  Hemoglobin 11.1 - 15.9 g/dL 13.2  Hematocrit 34.0 - 46.6 % 38.9  Platelets 150 - 450 x10E3/uL 203    Assessment and plan- Patient is a 35 y.o. female referred  for pathogenic BRIP1 gene mutation  With regards to patient's breast cancer risk we did recalculate her cancer risk score which comes at 19.5%.  It was previously calculated at 20.8%.  Since she is below the threshold for lifetime risk of 20% of breast cancer, she would not technically qualify for annual breast MRIs but this is something that can be considered after she turns 39 alternating with mammogram given that her risk is almost approaching 20%.  Risk of ovarian cancer with BRCA1 mutation ranges from 8 to 15%.  Prophylactic risk reduction salpingo-oophorectomy is recommended in women after childbearing is completed between the ages of 34-50.  Patient was interested in knowing if use of fertility medication/IVF would further increase her risk of ovarian cancer.Based on the data I am seeing more studies do not show significant risk of breast endometrial or ovarian cancer with the use of fertility medications.  Some studies have shown possible increased relative risk of borderline ovarian cancer but the absolute risk was small without a clear causal relationship.  There is no family history of ovarian cancer.  Thank you for this kind referral and the opportunity to participate in the care of this patient   Visit Diagnosis 1. Mutation in BRIP1 gene     Dr. Randa Evens, MD, MPH [facility] [phone] 01/25/2021

## 2021-02-01 ENCOUNTER — Ambulatory Visit (INDEPENDENT_AMBULATORY_CARE_PROVIDER_SITE_OTHER): Payer: 59 | Admitting: Obstetrics and Gynecology

## 2021-02-01 ENCOUNTER — Other Ambulatory Visit: Payer: Self-pay

## 2021-02-01 VITALS — BP 118/76 | Wt 165.0 lb

## 2021-02-01 DIAGNOSIS — Z1589 Genetic susceptibility to other disease: Secondary | ICD-10-CM

## 2021-02-01 DIAGNOSIS — O09299 Supervision of pregnancy with other poor reproductive or obstetric history, unspecified trimester: Secondary | ICD-10-CM

## 2021-02-01 DIAGNOSIS — O09529 Supervision of elderly multigravida, unspecified trimester: Secondary | ICD-10-CM

## 2021-02-01 DIAGNOSIS — O0990 Supervision of high risk pregnancy, unspecified, unspecified trimester: Secondary | ICD-10-CM

## 2021-02-01 DIAGNOSIS — Z1501 Genetic susceptibility to malignant neoplasm of breast: Secondary | ICD-10-CM

## 2021-02-01 DIAGNOSIS — O09819 Supervision of pregnancy resulting from assisted reproductive technology, unspecified trimester: Secondary | ICD-10-CM

## 2021-02-01 DIAGNOSIS — J45909 Unspecified asthma, uncomplicated: Secondary | ICD-10-CM

## 2021-02-01 DIAGNOSIS — O99519 Diseases of the respiratory system complicating pregnancy, unspecified trimester: Secondary | ICD-10-CM

## 2021-02-01 NOTE — Progress Notes (Signed)
ROB - LT side lower abdomen pain comes and goes x1 day. RM 5

## 2021-02-01 NOTE — Progress Notes (Signed)
° ° °Routine Prenatal Care Visit ° °Subjective  °Tanya Glenn is a 35 y.o. G2P0100 at [redacted]w[redacted]d being seen today for ongoing prenatal care.  She is currently monitored for the following issues for this high-risk pregnancy and has Leg pain, right; Psoriasis of scalp; Anxiety and depression; Asthma, mild intermittent; Palpitations; Hypokalemia; Narcolepsy; BPPV (benign paroxysmal positional vertigo); GERD (gastroesophageal reflux disease); Lipid screening; Dizziness; Supervision of high risk pregnancy, antepartum; Encounter for supervision of pregnancy resulting from assisted reproductive technology; Antepartum multigravida of advanced maternal age; Asthma during pregnancy; Low TSH level; Anticardiolipin antibody positive; Thrombocytopenia (HCC); Varicose veins of leg with swelling, right; Prior pregnancy with fetal demise; Hypothyroid in pregnancy, antepartum; Family history of breast cancer; Family history of gene mutation; Genetic testing; and Monoallelic mutation of BRIP1 gene on their problem list.  °----------------------------------------------------------------------------------- °Patient reports  occasional left sided cramping .  She reports prior history of short cervix in her prior pregnancy for which she was being followed °Contractions: Not present. Vag. Bleeding: None.  Movement: Absent. Denies leaking of fluid.  °----------------------------------------------------------------------------------- °The following portions of the patient's history were reviewed and updated as appropriate: allergies, current medications, past family history, past medical history, past social history, past surgical history and problem list. Problem list updated. ° ° °Objective  °Blood pressure 118/76, weight 165 lb (74.8 kg), last menstrual period 10/07/2020. °Pregravid weight 155 lb (70.3 kg) Total Weight Gain 10 lb (4.536 kg) °Urinalysis:     ° °Fetal Status: Fetal Heart Rate (bpm): 150   Movement: Absent    ° °General:   Alert, oriented and cooperative. Patient is in no acute distress.  °Skin: Skin is warm and dry. No rash noted.   °Cardiovascular: Normal heart rate noted  °Respiratory: Normal respiratory effort, no problems with respiration noted  °Abdomen: Soft, gravid, appropriate for gestational age. Pain/Pressure: Absent     °Pelvic:  Cervical exam deferred        °Extremities: Normal range of motion.     °ental Status: Normal mood and affect. Normal behavior. Normal judgment and thought content.  ° ° ° °Assessment  ° °35 y.o. G2P0100 at [redacted]w[redacted]d by  07/12/2021, by Embryo Transfer presenting for routine prenatal visit ° °Plan  ° °pregnancy Problems (from 12/19/20 to present)   ° ° Problem Noted Resolved  ° Prior pregnancy with fetal demise 12/19/2020 by Jackson, Stephen D, MD No  ° Overview Signed 12/19/2020  3:15 PM by Jackson, Stephen D, MD  °  At 28 weeks, ?nuchal cord. No other etiology found, ? Elevated anticardiolipin abs °  °  ° Hypothyroid in pregnancy, antepartum 12/19/2020 by Jackson, Stephen D, MD No  ° Overview Signed 12/19/2020  3:44 PM by Jackson, Stephen D, MD  °  - on levothyroxine 50 mcg °- followed by Dr. Solum at [facility] °  °  ° Supervision of high risk pregnancy, antepartum 01/04/2020 by Jackson, Stephen D, MD No  ° Overview Addendum 12/19/2020  3:12 PM by Jackson, Stephen D, MD  °  Clinic [facility] Prenatal Labs  °Dating  Blood type: O/Positive/-- (11/15 1455)   °Genetic Screen NIPS: Antibody:Negative (11/15 1455)  °Anatomic US  Rubella: 8.19 (11/15 1455)  °Varicella: immune  °GTT Early:               Third trimester:  RPR: Non Reactive (11/15 1455)   °Rhogam  HBsAg: Negative (11/15 1455)   °TDaP vaccine                         Flu Shot: HIV: Non Reactive (11/15 1455)   Baby Food                                GBS:   Contraception  Pap: up to date, normal  CBB     CS/VBAC    Support Person Husband: Jovita Kussmaul           Antepartum multigravida of advanced maternal age 65/15/2021 by Will Bonnet, MD  No   Overview Addendum 01/08/2020 12:32 PM by Will Bonnet, MD    [ ] NIPS testing pending, Euploid on PGS      Asthma during pregnancy 01/04/2020 by Will Bonnet, MD No        Gestational age appropriate obstetric precautions including but not limited to vaginal bleeding, contractions, leaking of fluid and fetal movement were reviewed in detail with the patient.    1) Reports history of short cervix  - 16 week cervical length ultrasound discussed  2) Anatomy scan - scheduled with [facility]  3) Constipation - discussed option for self care.  Suspect related to left sided cramping.  Uterus is at appropriate fundal height no concern for entrapment which she asked about as a potential etiology  Return in about 4 weeks (around 03/01/2021) for ROB 4 weeks.  Malachy Mood, MD, [facility], [facility] 02/01/2021, 2:32 PM

## 2021-02-02 DIAGNOSIS — E038 Other specified hypothyroidism: Secondary | ICD-10-CM | POA: Diagnosis not present

## 2021-02-02 DIAGNOSIS — E063 Autoimmune thyroiditis: Secondary | ICD-10-CM | POA: Diagnosis not present

## 2021-02-06 ENCOUNTER — Other Ambulatory Visit: Payer: Self-pay | Admitting: Family Medicine

## 2021-02-06 ENCOUNTER — Other Ambulatory Visit: Payer: Self-pay

## 2021-02-06 MED ORDER — LEVOTHYROXINE SODIUM 50 MCG PO TABS
50.0000 ug | ORAL_TABLET | Freq: Every day | ORAL | 1 refills | Status: DC
  Filled 2021-02-06: qty 90, 90d supply, fill #0
  Filled 2021-05-10: qty 90, 90d supply, fill #1

## 2021-02-07 ENCOUNTER — Other Ambulatory Visit: Payer: Self-pay

## 2021-02-08 ENCOUNTER — Other Ambulatory Visit: Payer: Self-pay

## 2021-02-08 DIAGNOSIS — E038 Other specified hypothyroidism: Secondary | ICD-10-CM | POA: Diagnosis not present

## 2021-02-08 DIAGNOSIS — O99282 Endocrine, nutritional and metabolic diseases complicating pregnancy, second trimester: Secondary | ICD-10-CM | POA: Diagnosis not present

## 2021-02-08 DIAGNOSIS — E063 Autoimmune thyroiditis: Secondary | ICD-10-CM | POA: Diagnosis not present

## 2021-02-08 DIAGNOSIS — E079 Disorder of thyroid, unspecified: Secondary | ICD-10-CM | POA: Diagnosis not present

## 2021-02-08 MED ORDER — LEVOTHYROXINE SODIUM 50 MCG PO TABS
ORAL_TABLET | ORAL | 5 refills | Status: DC
  Filled 2021-02-08: qty 30, 30d supply, fill #0

## 2021-02-09 ENCOUNTER — Other Ambulatory Visit: Payer: Self-pay

## 2021-02-14 ENCOUNTER — Observation Stay: Payer: 59

## 2021-02-14 ENCOUNTER — Encounter: Payer: Self-pay | Admitting: Emergency Medicine

## 2021-02-14 ENCOUNTER — Other Ambulatory Visit: Payer: Self-pay

## 2021-02-14 ENCOUNTER — Observation Stay
Admission: EM | Admit: 2021-02-14 | Discharge: 2021-02-14 | Disposition: A | Discharge status: Home / Self Care | Payer: 59 | Attending: Obstetrics & Gynecology | Admitting: Obstetrics & Gynecology

## 2021-02-14 DIAGNOSIS — O09812 Supervision of pregnancy resulting from assisted reproductive technology, second trimester: Secondary | ICD-10-CM

## 2021-02-14 DIAGNOSIS — Z3A19 19 weeks gestation of pregnancy: Secondary | ICD-10-CM | POA: Diagnosis not present

## 2021-02-14 DIAGNOSIS — O4692 Antepartum hemorrhage, unspecified, second trimester: Principal | ICD-10-CM | POA: Diagnosis present

## 2021-02-14 DIAGNOSIS — O09292 Supervision of pregnancy with other poor reproductive or obstetric history, second trimester: Secondary | ICD-10-CM | POA: Diagnosis not present

## 2021-02-14 DIAGNOSIS — Z7982 Long term (current) use of aspirin: Secondary | ICD-10-CM | POA: Diagnosis not present

## 2021-02-14 DIAGNOSIS — O09522 Supervision of elderly multigravida, second trimester: Secondary | ICD-10-CM | POA: Diagnosis not present

## 2021-02-14 DIAGNOSIS — F419 Anxiety disorder, unspecified: Secondary | ICD-10-CM | POA: Diagnosis not present

## 2021-02-14 DIAGNOSIS — J45909 Unspecified asthma, uncomplicated: Secondary | ICD-10-CM | POA: Insufficient documentation

## 2021-02-14 DIAGNOSIS — Z3689 Encounter for other specified antenatal screening: Secondary | ICD-10-CM | POA: Diagnosis not present

## 2021-02-14 DIAGNOSIS — Z79899 Other long term (current) drug therapy: Secondary | ICD-10-CM | POA: Insufficient documentation

## 2021-02-14 DIAGNOSIS — O99512 Diseases of the respiratory system complicating pregnancy, second trimester: Secondary | ICD-10-CM | POA: Insufficient documentation

## 2021-02-14 DIAGNOSIS — E039 Hypothyroidism, unspecified: Secondary | ICD-10-CM | POA: Diagnosis not present

## 2021-02-14 DIAGNOSIS — G47411 Narcolepsy with cataplexy: Secondary | ICD-10-CM | POA: Diagnosis not present

## 2021-02-14 DIAGNOSIS — O99282 Endocrine, nutritional and metabolic diseases complicating pregnancy, second trimester: Secondary | ICD-10-CM | POA: Insufficient documentation

## 2021-02-14 DIAGNOSIS — O0990 Supervision of high risk pregnancy, unspecified, unspecified trimester: Secondary | ICD-10-CM

## 2021-02-14 DIAGNOSIS — J4520 Mild intermittent asthma, uncomplicated: Secondary | ICD-10-CM | POA: Diagnosis not present

## 2021-02-14 DIAGNOSIS — O09299 Supervision of pregnancy with other poor reproductive or obstetric history, unspecified trimester: Secondary | ICD-10-CM

## 2021-02-14 DIAGNOSIS — Z3A18 18 weeks gestation of pregnancy: Secondary | ICD-10-CM | POA: Insufficient documentation

## 2021-02-14 DIAGNOSIS — O99519 Diseases of the respiratory system complicating pregnancy, unspecified trimester: Secondary | ICD-10-CM

## 2021-02-14 DIAGNOSIS — O09529 Supervision of elderly multigravida, unspecified trimester: Secondary | ICD-10-CM

## 2021-02-14 DIAGNOSIS — O09819 Supervision of pregnancy resulting from assisted reproductive technology, unspecified trimester: Secondary | ICD-10-CM | POA: Diagnosis not present

## 2021-02-14 DIAGNOSIS — O209 Hemorrhage in early pregnancy, unspecified: Secondary | ICD-10-CM | POA: Diagnosis not present

## 2021-02-14 HISTORY — DX: Anxiety disorder, unspecified: F41.9

## 2021-02-14 HISTORY — DX: Depression, unspecified: F32.A

## 2021-02-14 LAB — CBC
HCT: 37.5 % (ref 36.0–46.0)
Hemoglobin: 13.2 g/dL (ref 12.0–15.0)
MCH: 32.2 pg (ref 26.0–34.0)
MCHC: 35.2 g/dL (ref 30.0–36.0)
MCV: 91.5 fL (ref 80.0–100.0)
Platelets: 170 10*3/uL (ref 150–400)
RBC: 4.1 MIL/uL (ref 3.87–5.11)
RDW: 12.4 % (ref 11.5–15.5)
WBC: 9.3 10*3/uL (ref 4.0–10.5)
nRBC: 0 % (ref 0.0–0.2)

## 2021-02-14 MED ORDER — ACETAMINOPHEN 325 MG PO TABS: 650.0000 mg | ORAL_TABLET | ORAL | Status: DC | PRN

## 2021-02-14 NOTE — Discharge Summary (Signed)
Physician Discharge Summary  Patient ID: Tanya Glenn MRN: [id number] DOB/AGE: 08-19-1985 35 y.o.  Admit date: 02/14/2021 Discharge date: 02/14/2021  Admission Diagnoses:Principal Problem:   Second trimester bleeding  Discharge Diagnoses:  Principal Problem:   Second trimester bleeding   Discharged Condition: good  Hospital Course: Pt seen and examined, also had ultrasound.  No s/sx cervical incompetence although symptoms worrisome.  Plan to see [facility] as already scheduled for anatomy scan.  Consults: None  Significant Diagnostic Studies: Ultrasound does not show cervixl dilation or funneling.  Length 3.5cm.  Marginal placenta previa, although early gest age.  Treatments: none  Discharge Exam: Blood pressure 117/78, pulse 96, temperature 98.5 F (36.9 C), temperature source Oral, resp. rate 17, height 5' 4.5" (1.638 m), weight 74.8 kg, last menstrual period 10/07/2020, SpO2 100 %. No change  Disposition: Discharge disposition: 01-Home or Self Care       Discharge Instructions     Call MD for:   Complete by: As directed    Worsening contractions or pain; leakage of fluid; bleeding.   Diet - low sodium heart healthy   Complete by: As directed    Increase activity slowly   Complete by: As directed       Allergies as of 02/14/2021       Reactions   Sulfa Antibiotics Hives        Medication List     TAKE these medications    albuterol 108 (90 Base) MCG/ACT inhaler Commonly known as: VENTOLIN HFA INHALE 1 PUFF BY MOUTH EVERY 4 HOURS AS NEEDED FOR WHEEZING OR SHORTNESS OF BREATH.   aspirin 81 MG chewable tablet Chew by mouth daily.   B-D 3CC LUER-LOK SYR 18GX1-1/2 18G X 1-1/2" 3 ML Misc Generic drug: SYRINGE-NEEDLE (DISP) 3 ML Use as directed   BD Disp Needles 22G X 1-1/2" Misc Generic drug: NEEDLE (DISP) 22 G Use as directed   BD Disp Needles 30G X 1/2" Misc Generic drug: NEEDLE (DISP) 30 G use as directed   estradiol 0.1 MG/24HR  patch Commonly known as: VIVELLE-DOT Place 1 patch onto the skin every 3 days.   heparin 5000 UNIT/ML injection Inject into the skin.   levothyroxine 50 MCG tablet Commonly known as: SYNTHROID Take 1 tablet (50 mcg total) by mouth daily before breakfast.   levothyroxine 50 MCG tablet Commonly known as: SYNTHROID Take 1 tablet (50 mcg total) by mouth once daily Take on an empty stomach with a glass of water at least 30-60 minutes before breakfast.   medroxyPROGESTERone 10 MG tablet Commonly known as: Provera Take 1 tablet (10 mg total) by mouth daily for 10 days.   omega-3 acid ethyl esters 1 g capsule Commonly known as: LOVAZA Take by mouth 2 (two) times daily.   PRENATAL 1 PO Take by mouth.   progesterone 50 MG/ML injection Inject 1 ml into the muscle daily.   Qvar RediHaler 40 MCG/ACT inhaler Generic drug: beclomethasone INHALE 2 PUFFS INTO THE LUNGS TWO TIMES DAILY         Signed: Letitia Libra 02/14/2021, 10:12 AM

## 2021-02-14 NOTE — ED Notes (Signed)
Spoke with L&D  states to send her up to OBS 1   will page Dr Tiburcio Pea

## 2021-02-14 NOTE — ED Triage Notes (Addendum)
Presents with family with heavy vaginal bleeding   states pain started during the night  also passing clots  pt is 19.5 weeks preg  [facility] 07/12/2021

## 2021-02-14 NOTE — OB Triage Note (Signed)
Pt reports to L&D c/o  bright red vaginal bleeding that she noticed this morning. Pt reports last intercourse yesterday afternoon. Pt denies LOF. VSS. MD called to the bedside. Doppler FHT of 140 obtained at the bedside. Pt reports ,mild cramping that started this morning. Will continue to monitor.

## 2021-02-14 NOTE — Discharge Summary (Signed)
RN reviewed discharge instructions with patient. Gave pt opportunity for questions. All questions answered at this time. Pt verbalized understanding. Pt discharged home with significant other.  

## 2021-02-14 NOTE — H&P (Signed)
Obstetrics Admission History & Physical   Vaginal Bleeding (/)   HPI:  35 y.o. G2P0100 @ [redacted]w[redacted]d(07/12/2021, by Embryo Transfer). 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   Family history of breast cancer 12/21/2020   Family history of gene mutation 12/21/2020   Prior pregnancy with fetal demise 12/19/2020   Hypothyroid in pregnancy, antepartum 12/19/2020   Varicose veins of leg with swelling, 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   Low TSH level 08/15/2020   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   Thrombocytopenia ([place] 08/15/2020   Supervision of high risk pregnancy, antepartum 01/04/2020   Encounter for supervision of pregnancy resulting from assisted reproductive technology 01/04/2020   Antepartum multigravida of advanced maternal age 65/15/2021   Asthma during pregnancy 01/04/2020   Dizziness 09/01/2019   Lipid screening 04/29/2018   BPPV (benign paroxysmal positional vertigo) 06/26/2017   GERD (gastroesophageal reflux disease) 06/26/2017   Narcolepsy 12/21/2016   Palpitations 10/08/2016   Hypokalemia 10/08/2016   Asthma, mild intermittent 02/16/2016   Leg pain, right 09/26/2015   Anxiety and depression 02/05/2012   Psoriasis of scalp 03/01/2011    Presents for bleeding she noted this am.  No recent trauma or other sx's.  She did have sex yesterday.  She has prior 28 week IUFD, and this pregnancy has had help [facility] embryo transfer [facility]  No nausea, fever, other sx's.   [facility] by  as above .  ROS: A review of systems was performed and negative, except as stated in the above HPI. PMHx:  Past Medical History:  Diagnosis Date   Asthma    Asthma    Family history of breast cancer    Family history of gene mutation    Narcolepsy    Symptomatic PVCs    PSHx:   Past Surgical History:  Procedure Laterality Date   WISDOM TOOTH EXTRACTION     Medications:  Medications Prior to Admission  Medication Sig Dispense Refill Last Dose   albuterol (VENTOLIN HFA) 108 (90 Base) MCG/ACT inhaler INHALE 1 PUFF BY MOUTH EVERY 4 HOURS AS NEEDED FOR WHEEZING OR SHORTNESS OF BREATH. 18 g 2    aspirin 81 MG chewable tablet Chew by mouth daily.      beclomethasone (QVAR) 40 MCG/ACT inhaler INHALE 2 PUFFS INTO THE LUNGS TWO TIMES DAILY 10.6 g 2    estradiol (VIVELLE-DOT) 0.1 MG/24HR patch Place 1 patch onto the skin every 3 days. 8 patch 3    heparin 5000 UNIT/ML injection Inject into the skin. (Patient not taking: Reported on 01/24/2021)      levothyroxine (SYNTHROID) 50 MCG tablet Take 1 tablet (50 mcg total) by mouth daily before breakfast. 90 tablet 1    levothyroxine (SYNTHROID) 50 MCG tablet Take 1 tablet (50 mcg total) by mouth once daily Take on an empty stomach with a glass of water at least 30-60 minutes before breakfast. 30 tablet 5    medroxyPROGESTERone (PROVERA) 10 MG tablet Take 1 tablet (10 mg total) by mouth daily for 10 days. (Patient not taking: Reported on 01/24/2021) 10 tablet 0    NEEDLE, DISP, 22 G (BD DISP NEEDLES) 22G X 1-1/2" MISC Use as directed (Patient not taking: Reported on 02/01/2021) 30 each 2    NEEDLE, DISP, 30 G (BD DISP NEEDLES) 30G X 1/2" MISC use  as directed (Patient not taking: Reported on 02/01/2021) 60 each 2    omega-3 acid ethyl esters (LOVAZA) 1 g capsule Take by mouth 2 (two) times daily.      Prenatal MV-Min-Fe Fum-FA-DHA (PRENATAL 1 PO) Take by mouth.      progesterone 50 MG/ML injection Inject 1 ml into the muscle daily. (Patient not taking: Reported on 12/19/2020) 30 mL 2    SYRINGE-NEEDLE, DISP, 3 ML (B-D 3CC LUER-LOK SYR 18GX1-1/2) 18G X 1-1/2" 3 ML MISC Use as directed (Patient not taking: Reported on 01/24/2021) 30 each 2    Allergies: is allergic to sulfa antibiotics. OBHx:  OB History  Gravida Para Term Preterm AB  Living  2 1 0 1 0 0  SAB IAB Ectopic Multiple Live Births  0 0 0 0      # Outcome Date GA Lbr Len/2nd Weight Sex Delivery Anes PTL Lv  2 Current           1 Preterm 05/04/20 [redacted]w[redacted]d/ 00:13 990 g F Vag-Spont EPI  FD   FIWO:EHOZYYQM/[id number] as detailed in HPI..Marland Kitchen No family history of birth defects. Soc Hx: Alcohol: none and Recreational drug use: none  Objective:   Vitals:   02/14/21 0818  BP: 117/78  Pulse: 96  Resp: 17  Temp: 98.5 F (36.9 C)  SpO2: 100%   Constitutional: Well nourished, well developed female in no acute distress.  HEENT: normal Skin: Warm and dry.  Cardiovascular:Regular rate and rhythm.   Extremity: trace to 1+ bilateral pedal edema Respiratory: Clear to auscultation bilateral. Normal respiratory effort Abdomen: gravid, ND, FHT present, without guarding, without rebound tenderness on exam Back: no CVAT Neuro: DTRs 2+, Cranial nerves grossly intact Psych: Alert and Oriented x3. No memory deficits. Normal mood and affect.  MS: normal gait, normal bilateral lower extremity ROM/strength/stability.  Pelvic exam: is not limited by body habitus EGBUS: within normal limits Vagina: within normal limits and with normal mucosa, scant blood Cervix: SSE- no dilation, scant blood at os, no membranes    SVE- FT, 20% or long cervix by gentle palpation FHT 140s by Doppler  Assessment & Plan:   35y.o. G2P0100 @ 162w6dAdmitted on 02/14/2021:Second trimester bleeding, concern for cervical incompetence  Ultrasound to assess cervix [place] will plan to keep appt if [place] reassuring.  If [place] concerning for shortened cervix, then will arrange transfer by ambulance for further assessment and potential for Cerclage.     PaBarnett ApplebaumMD, [facility]ardon[facility]yn, [facility] 02/14/2021  9:00 AM

## 2021-02-15 ENCOUNTER — Encounter: Payer: Self-pay | Admitting: Obstetrics and Gynecology

## 2021-02-15 NOTE — Telephone Encounter (Signed)
Pt tx'd from [facility]; was seen in L&D yesterday for heavy bleeding; was sent home and told to do nothing more than walk; bleeding stopped; when she wiped a while ago it was stringy and bright red (see picture send in MyChart.  Pt did have IC Monday.  Adv probably from that.  Adv if becomes like a period again to be seen again.

## 2021-02-16 ENCOUNTER — Other Ambulatory Visit: Payer: Self-pay | Admitting: Obstetrics & Gynecology

## 2021-02-19 NOTE — L&D Delivery Note (Signed)
Delivery Note ?At 10:02 AM a viable female was delivered via Vaginal, Spontaneous (Presentation: Left Occiput Anterior).  APGAR: 9, 9 ; weight pending.   ?Placenta status: Spontaneous, Intact.  Cord: 3 vessels with the following complications: None.  Cord pH: n/a ? ?Anesthesia: Epidural ?Episiotomy: None ?Lacerations: 2nd degree;Vaginal;Perineal ?Suture Repair: 2.0 vicryl ?Est. Blood Loss (mL):  450 ? ?Mom to postpartum.  Baby to Couplet care / Skin to Skin. ? ?Called to see patient.  Mom pushed to deliver a viable female infant.  The head followed by shoulders, which delivered without difficulty, and the rest of the body.  No nuchal cord noted.  Baby to mom's chest.  Cord clamped and cut after > 1 min delay.  Cord blood obtained.  Placenta delivered spontaneously, intact, with a 3-vessel cord.  Second degree perineal laceration repaired with 2-0 Vicryl in standard fashion.  All counts correct.  Hemostasis obtained with IV pitocin and fundal massage. EBL 450 mL.   QBL is pending. ? ?Thomasene Mohair, MD ?06/26/2021, 10:33 AM ? ? ? ?

## 2021-02-21 ENCOUNTER — Other Ambulatory Visit: Payer: Self-pay

## 2021-02-21 DIAGNOSIS — O99512 Diseases of the respiratory system complicating pregnancy, second trimester: Secondary | ICD-10-CM | POA: Diagnosis not present

## 2021-02-21 DIAGNOSIS — O99282 Endocrine, nutritional and metabolic diseases complicating pregnancy, second trimester: Secondary | ICD-10-CM | POA: Diagnosis not present

## 2021-02-21 DIAGNOSIS — E039 Hypothyroidism, unspecified: Secondary | ICD-10-CM | POA: Diagnosis not present

## 2021-02-21 DIAGNOSIS — J45909 Unspecified asthma, uncomplicated: Secondary | ICD-10-CM | POA: Diagnosis not present

## 2021-02-21 DIAGNOSIS — O209 Hemorrhage in early pregnancy, unspecified: Secondary | ICD-10-CM | POA: Diagnosis not present

## 2021-02-21 DIAGNOSIS — Z3A19 19 weeks gestation of pregnancy: Secondary | ICD-10-CM | POA: Diagnosis not present

## 2021-02-21 MED ORDER — METRONIDAZOLE 500 MG PO TABS
500.0000 mg | ORAL_TABLET | Freq: Two times a day (BID) | ORAL | 0 refills | Status: DC
  Filled 2021-02-21: qty 14, 7d supply, fill #0

## 2021-02-23 DIAGNOSIS — E038 Other specified hypothyroidism: Secondary | ICD-10-CM | POA: Diagnosis not present

## 2021-02-23 DIAGNOSIS — E063 Autoimmune thyroiditis: Secondary | ICD-10-CM | POA: Diagnosis not present

## 2021-02-28 DIAGNOSIS — O09819 Supervision of pregnancy resulting from assisted reproductive technology, unspecified trimester: Secondary | ICD-10-CM | POA: Diagnosis not present

## 2021-02-28 DIAGNOSIS — O09292 Supervision of pregnancy with other poor reproductive or obstetric history, second trimester: Secondary | ICD-10-CM | POA: Diagnosis not present

## 2021-02-28 DIAGNOSIS — O0992 Supervision of high risk pregnancy, unspecified, second trimester: Secondary | ICD-10-CM | POA: Diagnosis not present

## 2021-02-28 DIAGNOSIS — R760 Raised antibody titer: Secondary | ICD-10-CM | POA: Diagnosis not present

## 2021-03-03 ENCOUNTER — Encounter: Payer: 59 | Admitting: Obstetrics and Gynecology

## 2021-03-13 ENCOUNTER — Other Ambulatory Visit: Payer: Self-pay | Admitting: Obstetrics & Gynecology

## 2021-03-13 DIAGNOSIS — O09812 Supervision of pregnancy resulting from assisted reproductive technology, second trimester: Secondary | ICD-10-CM | POA: Diagnosis not present

## 2021-03-13 DIAGNOSIS — Z3689 Encounter for other specified antenatal screening: Secondary | ICD-10-CM | POA: Diagnosis not present

## 2021-03-13 DIAGNOSIS — O09299 Supervision of pregnancy with other poor reproductive or obstetric history, unspecified trimester: Secondary | ICD-10-CM | POA: Diagnosis not present

## 2021-03-13 DIAGNOSIS — Z3A22 22 weeks gestation of pregnancy: Secondary | ICD-10-CM | POA: Diagnosis not present

## 2021-03-13 DIAGNOSIS — O0992 Supervision of high risk pregnancy, unspecified, second trimester: Secondary | ICD-10-CM | POA: Diagnosis not present

## 2021-03-15 ENCOUNTER — Encounter: Payer: Self-pay | Admitting: Obstetrics and Gynecology

## 2021-03-15 ENCOUNTER — Other Ambulatory Visit: Payer: Self-pay

## 2021-03-15 ENCOUNTER — Observation Stay
Admission: EM | Admit: 2021-03-15 | Discharge: 2021-03-15 | Disposition: A | Discharge status: Home / Self Care | Payer: 59 | Attending: Obstetrics and Gynecology | Admitting: Obstetrics and Gynecology

## 2021-03-15 DIAGNOSIS — O26892 Other specified pregnancy related conditions, second trimester: Secondary | ICD-10-CM | POA: Diagnosis not present

## 2021-03-15 DIAGNOSIS — O212 Late vomiting of pregnancy: Secondary | ICD-10-CM | POA: Diagnosis not present

## 2021-03-15 DIAGNOSIS — R112 Nausea with vomiting, unspecified: Secondary | ICD-10-CM | POA: Diagnosis present

## 2021-03-15 DIAGNOSIS — O09529 Supervision of elderly multigravida, unspecified trimester: Secondary | ICD-10-CM

## 2021-03-15 DIAGNOSIS — O99280 Endocrine, nutritional and metabolic diseases complicating pregnancy, unspecified trimester: Secondary | ICD-10-CM

## 2021-03-15 DIAGNOSIS — O99612 Diseases of the digestive system complicating pregnancy, second trimester: Secondary | ICD-10-CM | POA: Diagnosis not present

## 2021-03-15 DIAGNOSIS — E039 Hypothyroidism, unspecified: Secondary | ICD-10-CM | POA: Diagnosis not present

## 2021-03-15 DIAGNOSIS — Z3A23 23 weeks gestation of pregnancy: Secondary | ICD-10-CM | POA: Insufficient documentation

## 2021-03-15 DIAGNOSIS — O99282 Endocrine, nutritional and metabolic diseases complicating pregnancy, second trimester: Secondary | ICD-10-CM | POA: Diagnosis not present

## 2021-03-15 DIAGNOSIS — O99519 Diseases of the respiratory system complicating pregnancy, unspecified trimester: Secondary | ICD-10-CM

## 2021-03-15 DIAGNOSIS — R197 Diarrhea, unspecified: Secondary | ICD-10-CM | POA: Insufficient documentation

## 2021-03-15 DIAGNOSIS — K2970 Gastritis, unspecified, without bleeding: Secondary | ICD-10-CM | POA: Diagnosis not present

## 2021-03-15 DIAGNOSIS — Z20822 Contact with and (suspected) exposure to covid-19: Secondary | ICD-10-CM | POA: Insufficient documentation

## 2021-03-15 DIAGNOSIS — O09299 Supervision of pregnancy with other poor reproductive or obstetric history, unspecified trimester: Secondary | ICD-10-CM

## 2021-03-15 DIAGNOSIS — O0990 Supervision of high risk pregnancy, unspecified, unspecified trimester: Secondary | ICD-10-CM

## 2021-03-15 DIAGNOSIS — O4692 Antepartum hemorrhage, unspecified, second trimester: Secondary | ICD-10-CM | POA: Diagnosis not present

## 2021-03-15 LAB — COMPREHENSIVE METABOLIC PANEL
ALT: 13 U/L (ref 0–44)
AST: 20 U/L (ref 15–41)
Albumin: 3 g/dL — ABNORMAL LOW (ref 3.5–5.0)
Alkaline Phosphatase: 46 U/L (ref 38–126)
Anion gap: 8 (ref 5–15)
BUN: 15 mg/dL (ref 6–20)
CO2: 23 mmol/L (ref 22–32)
Calcium: 8.2 mg/dL — ABNORMAL LOW (ref 8.9–10.3)
Chloride: 105 mmol/L (ref 98–111)
Creatinine, Ser: 0.55 mg/dL (ref 0.44–1.00)
GFR, Estimated: >60 mL/min (ref >60)
Glucose, Bld: 112 mg/dL — ABNORMAL HIGH (ref 70–99)
Potassium: 3.7 mmol/L (ref 3.5–5.1)
Sodium: 136 mmol/L (ref 135–145)
Total Bilirubin: 0.4 mg/dL (ref 0.3–1.2)
Total Protein: 6.5 g/dL (ref 6.5–8.1)

## 2021-03-15 LAB — CBC WITH DIFFERENTIAL/PLATELET
Abs Immature Granulocytes: 0.04 10*3/uL (ref 0.00–0.07)
Basophils Absolute: 0 10*3/uL (ref 0.0–0.1)
Basophils Relative: 0 %
Eosinophils Absolute: 0 10*3/uL (ref 0.0–0.5)
Eosinophils Relative: 0 %
HCT: 36.2 % (ref 36.0–46.0)
Hemoglobin: 12.7 g/dL (ref 12.0–15.0)
Immature Granulocytes: 0 %
Lymphocytes Relative: 4 %
Lymphs Abs: 0.5 10*3/uL — ABNORMAL LOW (ref 0.7–4.0)
MCH: 31.7 pg (ref 26.0–34.0)
MCHC: 35.1 g/dL (ref 30.0–36.0)
MCV: 90.3 fL (ref 80.0–100.0)
Monocytes Absolute: 0.2 10*3/uL (ref 0.1–1.0)
Monocytes Relative: 2 %
Neutro Abs: 10.3 10*3/uL — ABNORMAL HIGH (ref 1.7–7.7)
Neutrophils Relative %: 94 %
Platelets: 138 10*3/uL — ABNORMAL LOW (ref 150–400)
RBC: 4.01 MIL/uL (ref 3.87–5.11)
RDW: 12.5 % (ref 11.5–15.5)
WBC: 11 10*3/uL — ABNORMAL HIGH (ref 4.0–10.5)
nRBC: 0 % (ref 0.0–0.2)

## 2021-03-15 LAB — RESP PANEL BY RT-PCR (FLU A&B, COVID) ARPGX2
Influenza A by PCR: NEGATIVE
Influenza B by PCR: NEGATIVE
SARS Coronavirus 2 by RT PCR: NEGATIVE

## 2021-03-15 LAB — URINALYSIS, COMPLETE (UACMP) WITH MICROSCOPIC
Bacteria, UA: NONE SEEN
Bilirubin Urine: NEGATIVE
Glucose, UA: NEGATIVE mg/dL
Hgb urine dipstick: NEGATIVE
Ketones, ur: 15 mg/dL — AB
Leukocytes,Ua: NEGATIVE
Nitrite: NEGATIVE
Protein, ur: NEGATIVE mg/dL
Specific Gravity, Urine: 1.01 (ref 1.005–1.030)
pH: 7 (ref 5.0–8.0)

## 2021-03-15 MED ORDER — PROMETHAZINE HCL 25 MG PO TABS
12.5000 mg | ORAL_TABLET | Freq: Four times a day (QID) | ORAL | 0 refills | Status: DC | PRN
  Filled 2021-03-15: qty 15, 8d supply, fill #0

## 2021-03-15 MED ORDER — LACTATED RINGERS IV SOLN: INTRAVENOUS | Status: DC

## 2021-03-15 MED ORDER — LOPERAMIDE HCL 2 MG PO CAPS
2.0000 mg | ORAL_CAPSULE | ORAL | Status: DC | PRN
  Filled 2021-03-15 (×2): qty 1

## 2021-03-15 MED ORDER — SODIUM CHLORIDE 0.9 % IV SOLN
25.0000 mg | Freq: Once | INTRAVENOUS | Status: AC
  Administered 2021-03-15: 13:00:00 25 mg via INTRAVENOUS
  Filled 2021-03-15: qty 1

## 2021-03-15 MED ORDER — LOPERAMIDE HCL 2 MG PO CAPS
4.0000 mg | ORAL_CAPSULE | ORAL | Status: DC | PRN
  Administered 2021-03-15: 13:00:00 2 mg via ORAL
  Filled 2021-03-15 (×2): qty 2

## 2021-03-15 MED ORDER — ONDANSETRON HCL 4 MG/2ML IJ SOLN
4.0000 mg | Freq: Four times a day (QID) | INTRAMUSCULAR | Status: DC | PRN
  Filled 2021-03-15: qty 2

## 2021-03-15 MED ORDER — LACTATED RINGERS IV BOLUS
1000.0000 mL | Freq: Once | INTRAVENOUS | Status: AC
Start: 2021-03-15 — End: 2021-03-15
  Administered 2021-03-15: 13:00:00 1000 mL via INTRAVENOUS

## 2021-03-15 NOTE — Progress Notes (Signed)
Pt discharged home per J.Oxley,CNM order.  Pt stable and ambulatory and an After Visit Summary was printed and given to the patient. Discharge education completed with patient/family including follow up instructions, appointments, and medication list. Pt received labor and bleeding precautions. Patient able to verbalize understanding, all questions fully answered upon discharge. Patient instructed to return to ED, call 911, or call MD for any changes in condition. Pt discharged home via personal vehicle with support person.  ° °

## 2021-03-15 NOTE — OB Triage Note (Signed)
Pt Tanya Glenn 36 y.o. presents to labor and delivery triage reporting  N/V and Diarrhea . Pt is a G2P0100 at [redacted]w[redacted]d . Pt denies signs and symptons consistent with rupture of membranes or active vaginal bleeding. Pt denies contractions but reports mild cramping and states positive fetal movement.  Pt reports N/V since 0100 reporting 4-5 episodes within an hour and diarrhea that started at 0400 and reports 4-5 episodes of diarrhea. No N/V or diarrhea since being in triage. External FM and TOCO applied to non-tender abdomen and assessing. Initial FHR 140 . Vital signs obtained and within normal limits. Provider notified of pt.

## 2021-03-15 NOTE — Progress Notes (Signed)
Pt tolerated PO fluids, ice chips, water, ginger ale and crackers. Pt received 1,000 mL LR bolus, phenergan 25mg  IV, and Imodium 2mg  tablet. Pt feels better since coming into triage. Pt had 4 episodes of diarrhea while in triage.

## 2021-03-15 NOTE — Discharge Summary (Signed)
Patient ID: Tanya Glenn MRN: [id number] DOB/AGE: Jan 05, 1986 36 y.o.  Admit date: 03/15/2021 Discharge date: 03/15/2021  Admission Diagnoses: 35yo G2P0 at [redacted]w[redacted]d presents with N/V/D onset at 0100 this am. Pt reports some cramping but has a baseline of cramping with this pregnancy and it also may be related to the diarrhea.   Factors complicating pregnancy: 1. AMA 2. History of fetal demise at 28 weeks 3. Bleeding in second trimester 4. IVF pregnancy 5. Hypothyroidism 6. Narcolepsy 7. Dr Jean Rosenthal will Special patient  Discharge Diagnoses: Gastritis  Prenatal Procedures: none  Consults: None  Significant Diagnostic Studies:  Results for orders placed or performed during the hospital encounter of 03/15/21 (from the past 168 hour(s))  Resp Panel by RT-PCR (Flu A&B, Covid) Nasopharyngeal Swab   Collection Time: 03/15/21  9:50 AM   Specimen: Nasopharyngeal Swab; Nasopharyngeal(NP) swabs in vial transport medium  Result Value Ref Range   SARS Coronavirus 2 by RT PCR NEGATIVE NEGATIVE   Influenza A by PCR NEGATIVE NEGATIVE   Influenza B by PCR NEGATIVE NEGATIVE  CBC with Differential/Platelet   Collection Time: 03/15/21  9:50 AM  Result Value Ref Range   WBC 11.0 (H) 4.0 - 10.5 K/uL   RBC 4.01 3.87 - 5.11 MIL/uL   Hemoglobin 12.7 12.0 - 15.0 g/dL   HCT 97.3 53.2 - 99.2 %   MCV 90.3 80.0 - 100.0 fL   MCH 31.7 26.0 - 34.0 pg   MCHC 35.1 30.0 - 36.0 g/dL   RDW 42.6 83.4 - 19.6 %   Platelets 138 (L) 150 - 400 K/uL   nRBC 0.0 0.0 - 0.2 %   Neutrophils Relative % 94 %   Neutro Abs 10.3 (H) 1.7 - 7.7 K/uL   Lymphocytes Relative 4 %   Lymphs Abs 0.5 (L) 0.7 - 4.0 K/uL   Monocytes Relative 2 %   Monocytes Absolute 0.2 0.1 - 1.0 K/uL   Eosinophils Relative 0 %   Eosinophils Absolute 0.0 0.0 - 0.5 K/uL   Basophils Relative 0 %   Basophils Absolute 0.0 0.0 - 0.1 K/uL   Immature Granulocytes 0 %   Abs Immature Granulocytes 0.04 0.00 - 0.07 K/uL  Comprehensive metabolic panel    Collection Time: 03/15/21  9:50 AM  Result Value Ref Range   Sodium 136 135 - 145 mmol/L   Potassium 3.7 3.5 - 5.1 mmol/L   Chloride 105 98 - 111 mmol/L   CO2 23 22 - 32 mmol/L   Glucose, Bld 112 (H) 70 - 99 mg/dL   BUN 15 6 - 20 mg/dL   Creatinine, Ser 2.22 0.44 - 1.00 mg/dL   Calcium 8.2 (L) 8.9 - 10.3 mg/dL   Total Protein 6.5 6.5 - 8.1 g/dL   Albumin 3.0 (L) 3.5 - 5.0 g/dL   AST 20 15 - 41 U/L   ALT 13 0 - 44 U/L   Alkaline Phosphatase 46 38 - 126 U/L   Total Bilirubin 0.4 0.3 - 1.2 mg/dL   GFR, Estimated >97 >98 mL/min   Anion gap 8 5 - 15  Urinalysis, Complete w Microscopic Urine, Clean Catch   Collection Time: 03/15/21 11:38 AM  Result Value Ref Range   Color, Urine YELLOW YELLOW   APPearance CLEAR CLEAR   Specific Gravity, Urine 1.010 1.005 - 1.030   pH 7.0 5.0 - 8.0   Glucose, UA NEGATIVE NEGATIVE mg/dL   Hgb urine dipstick NEGATIVE NEGATIVE   Bilirubin Urine NEGATIVE NEGATIVE   Ketones, ur 15 (A) NEGATIVE  mg/dL   Protein, ur NEGATIVE NEGATIVE mg/dL   Nitrite NEGATIVE NEGATIVE   Leukocytes,Ua NEGATIVE NEGATIVE   Squamous Epithelial / LPF 0-5 0 - 5   WBC, UA 0-5 0 - 5 WBC/hpf   RBC / HPF 0-5 0 - 5 RBC/hpf   Bacteria, UA NONE SEEN NONE SEEN   Mucus PRESENT     Treatments: IV hydration, antiemetics   Hospital Course:  This is a 36 y.o. G2P0100 with IUP at [redacted]w[redacted]d seen for N/V/D, labs collected and IV hydration initiated. Offered antiemetics and antidiarrhea.  Dr. Jean Rosenthal came to bedside to discuss plan of care after reviewing labs and FHTs - 2L total IVF (1L bolus), 25mg  of Phenergan, and 4mg  of Imodium, and can send home with 12.5 mg Phenergan PO.  She was observed, fetal heart rate monitoring remained reassuring, and she had no signs/symptoms of preterm labor or other maternal-fetal concerns.  She was deemed stable for discharge to home with outpatient follow up.  Discharge Physical Exam:  BP 117/83 (BP Location: Left Arm)    Pulse 97    Temp 98.2 F (36.8 C)  (Oral)    Resp 18    LMP 10/07/2020 (Exact Date)    SpO2 100%   General: NAD CV: RRR Pulm: nl effort ABD: s/nd/nt, gravid DVT Evaluation: LE non-ttp, no evidence of DVT on exam.  TOCO: quiet SVE: deferred      Discharge Condition: Stable  Disposition:  Discharge disposition: 01-Home or Self Care        Allergies as of 03/15/2021       Reactions   Sulfa Antibiotics Hives        Medication List     STOP taking these medications    B-D 3CC LUER-LOK SYR 18GX1-1/2 18G X 1-1/2" 3 ML Misc Generic drug: SYRINGE-NEEDLE (DISP) 3 ML   BD Disp Needles 22G X 1-1/2" Misc Generic drug: NEEDLE (DISP) 22 G   BD Disp Needles 30G X 1/2" Misc Generic drug: NEEDLE (DISP) 30 G   heparin 5000 UNIT/ML injection   medroxyPROGESTERone 10 MG tablet Commonly known as: Provera   metroNIDAZOLE 500 MG tablet Commonly known as: FLAGYL   progesterone 50 MG/ML injection       TAKE these medications    albuterol 108 (90 Base) MCG/ACT inhaler Commonly known as: VENTOLIN HFA INHALE 1 PUFF BY MOUTH EVERY 4 HOURS AS NEEDED FOR WHEEZING OR SHORTNESS OF BREATH.   aspirin 81 MG chewable tablet Chew by mouth daily.   estradiol 0.1 MG/24HR patch Commonly known as: VIVELLE-DOT Place 1 patch onto the skin every 3 days.   levothyroxine 50 MCG tablet Commonly known as: SYNTHROID Take 1 tablet (50 mcg total) by mouth daily before breakfast.   levothyroxine 50 MCG tablet Commonly known as: SYNTHROID Take 1 tablet (50 mcg total) by mouth once daily Take on an empty stomach with a glass of water at least 30-60 minutes before breakfast.   omega-3 acid ethyl esters 1 g capsule Commonly known as: LOVAZA Take by mouth 2 (two) times daily.   PRENATAL 1 PO Take by mouth.   promethazine 25 MG tablet Commonly known as: PHENERGAN Take 0.5 tablets (12.5 mg total) by mouth every 6 (six) hours as needed for nausea or vomiting.   Qvar RediHaler 40 MCG/ACT inhaler Generic drug:  beclomethasone INHALE 2 PUFFS INTO THE LUNGS TWO TIMES DAILY        Follow-up Information     10/09/2020, MD Follow up.  Specialty: Obstetrics and Gynecology Why: Keep all scheduled appointments Contact information: [address] [place] [address] [phone]                 Signed:  Quillian QuinceJENNIFER Zeenat Jeanbaptiste, CNM 03/15/2021 2:40 PM

## 2021-03-15 NOTE — Progress Notes (Signed)
Pt refusing IV zofran and Imodium for her N/V and diarrhea symptoms. Pt states, "Those are category B and C drugs during pregnancy, I do not want to risk it." J.Rochele Raring, CNM notified.

## 2021-03-28 DIAGNOSIS — Z3689 Encounter for other specified antenatal screening: Secondary | ICD-10-CM | POA: Diagnosis not present

## 2021-03-28 DIAGNOSIS — Z3A24 24 weeks gestation of pregnancy: Secondary | ICD-10-CM | POA: Diagnosis not present

## 2021-03-28 DIAGNOSIS — O0990 Supervision of high risk pregnancy, unspecified, unspecified trimester: Secondary | ICD-10-CM | POA: Diagnosis not present

## 2021-03-28 DIAGNOSIS — O09812 Supervision of pregnancy resulting from assisted reproductive technology, second trimester: Secondary | ICD-10-CM | POA: Diagnosis not present

## 2021-03-28 DIAGNOSIS — O09819 Supervision of pregnancy resulting from assisted reproductive technology, unspecified trimester: Secondary | ICD-10-CM | POA: Diagnosis not present

## 2021-03-28 DIAGNOSIS — O09292 Supervision of pregnancy with other poor reproductive or obstetric history, second trimester: Secondary | ICD-10-CM | POA: Diagnosis not present

## 2021-03-28 DIAGNOSIS — O09299 Supervision of pregnancy with other poor reproductive or obstetric history, unspecified trimester: Secondary | ICD-10-CM | POA: Diagnosis not present

## 2021-03-28 DIAGNOSIS — O09529 Supervision of elderly multigravida, unspecified trimester: Secondary | ICD-10-CM | POA: Diagnosis not present

## 2021-03-28 DIAGNOSIS — O09522 Supervision of elderly multigravida, second trimester: Secondary | ICD-10-CM | POA: Diagnosis not present

## 2021-03-31 ENCOUNTER — Other Ambulatory Visit: Payer: Self-pay

## 2021-03-31 MED ORDER — CARESTART COVID-19 HOME TEST VI KIT
PACK | 0 refills | Status: DC
  Filled 2021-03-31: qty 2, 4d supply, fill #0

## 2021-04-05 DIAGNOSIS — E038 Other specified hypothyroidism: Secondary | ICD-10-CM | POA: Diagnosis not present

## 2021-04-05 DIAGNOSIS — E063 Autoimmune thyroiditis: Secondary | ICD-10-CM | POA: Diagnosis not present

## 2021-04-12 DIAGNOSIS — E063 Autoimmune thyroiditis: Secondary | ICD-10-CM | POA: Diagnosis not present

## 2021-04-12 DIAGNOSIS — E038 Other specified hypothyroidism: Secondary | ICD-10-CM | POA: Diagnosis not present

## 2021-04-12 DIAGNOSIS — E079 Disorder of thyroid, unspecified: Secondary | ICD-10-CM | POA: Diagnosis not present

## 2021-04-12 DIAGNOSIS — O99283 Endocrine, nutritional and metabolic diseases complicating pregnancy, third trimester: Secondary | ICD-10-CM | POA: Diagnosis not present

## 2021-04-14 DIAGNOSIS — O0990 Supervision of high risk pregnancy, unspecified, unspecified trimester: Secondary | ICD-10-CM | POA: Diagnosis not present

## 2021-04-23 ENCOUNTER — Other Ambulatory Visit: Payer: Self-pay

## 2021-04-23 ENCOUNTER — Ambulatory Visit (HOSPITAL_COMMUNITY)
Admission: AD | Admit: 2021-04-23 | Discharge: 2021-04-23 | Disposition: A | Discharge status: Home / Self Care | Payer: 59 | Source: Other Acute Inpatient Hospital | Attending: Obstetrics and Gynecology | Admitting: Obstetrics and Gynecology

## 2021-04-23 ENCOUNTER — Observation Stay
Admission: EM | Admit: 2021-04-23 | Discharge: 2021-04-23 | DRG: 833 | Disposition: A | Discharge status: Other Acute Inpatient Hospital | Payer: 59 | Attending: Obstetrics and Gynecology | Admitting: Obstetrics and Gynecology

## 2021-04-23 ENCOUNTER — Encounter: Payer: Self-pay | Admitting: Obstetrics and Gynecology

## 2021-04-23 DIAGNOSIS — O479 False labor, unspecified: Secondary | ICD-10-CM | POA: Diagnosis not present

## 2021-04-23 DIAGNOSIS — Z3A00 Weeks of gestation of pregnancy not specified: Secondary | ICD-10-CM | POA: Insufficient documentation

## 2021-04-23 DIAGNOSIS — Z7982 Long term (current) use of aspirin: Secondary | ICD-10-CM

## 2021-04-23 DIAGNOSIS — O0993 Supervision of high risk pregnancy, unspecified, third trimester: Secondary | ICD-10-CM | POA: Diagnosis not present

## 2021-04-23 DIAGNOSIS — O321XX0 Maternal care for breech presentation, not applicable or unspecified: Secondary | ICD-10-CM | POA: Diagnosis not present

## 2021-04-23 DIAGNOSIS — Z20822 Contact with and (suspected) exposure to covid-19: Secondary | ICD-10-CM | POA: Diagnosis present

## 2021-04-23 DIAGNOSIS — E039 Hypothyroidism, unspecified: Secondary | ICD-10-CM | POA: Diagnosis not present

## 2021-04-23 DIAGNOSIS — O99283 Endocrine, nutritional and metabolic diseases complicating pregnancy, third trimester: Secondary | ICD-10-CM | POA: Diagnosis present

## 2021-04-23 DIAGNOSIS — Z3A28 28 weeks gestation of pregnancy: Secondary | ICD-10-CM | POA: Diagnosis not present

## 2021-04-23 DIAGNOSIS — O4703 False labor before 37 completed weeks of gestation, third trimester: Secondary | ICD-10-CM | POA: Diagnosis present

## 2021-04-23 LAB — URINALYSIS, COMPLETE (UACMP) WITH MICROSCOPIC
Bacteria, UA: NONE SEEN
Bilirubin Urine: NEGATIVE
Glucose, UA: NEGATIVE mg/dL
Hgb urine dipstick: NEGATIVE
Ketones, ur: NEGATIVE mg/dL
Leukocytes,Ua: NEGATIVE
Nitrite: NEGATIVE
Protein, ur: NEGATIVE mg/dL
Specific Gravity, Urine: 1.006 (ref 1.005–1.030)
Squamous Epithelial / HPF: NONE SEEN (ref 0–5)
WBC, UA: NONE SEEN WBC/hpf (ref 0–5)
pH: 7 (ref 5.0–8.0)

## 2021-04-23 LAB — CBC
HCT: 32.5 % — ABNORMAL LOW (ref 36.0–46.0)
Hemoglobin: 11.4 g/dL — ABNORMAL LOW (ref 12.0–15.0)
MCH: 31.8 pg (ref 26.0–34.0)
MCHC: 35.1 g/dL (ref 30.0–36.0)
MCV: 90.8 fL (ref 80.0–100.0)
Platelets: 136 10*3/uL — ABNORMAL LOW (ref 150–400)
RBC: 3.58 MIL/uL — ABNORMAL LOW (ref 3.87–5.11)
RDW: 13 % (ref 11.5–15.5)
WBC: 9.4 10*3/uL (ref 4.0–10.5)
nRBC: 0 % (ref 0.0–0.2)

## 2021-04-23 LAB — TYPE AND SCREEN
ABO/RH(D): O POS
Antibody Screen: NEGATIVE

## 2021-04-23 LAB — GROUP B STREP BY PCR: Group B strep by PCR: NEGATIVE

## 2021-04-23 LAB — FETAL FIBRONECTIN: Fetal Fibronectin: NEGATIVE

## 2021-04-23 LAB — WET PREP, GENITAL
Clue Cells Wet Prep HPF POC: NONE SEEN
Sperm: NONE SEEN
Trich, Wet Prep: NONE SEEN
WBC, Wet Prep HPF POC: >=10 — AB (ref <10)
Yeast Wet Prep HPF POC: NONE SEEN

## 2021-04-23 LAB — RESP PANEL BY RT-PCR (FLU A&B, COVID) ARPGX2
Influenza A by PCR: NEGATIVE
Influenza B by PCR: NEGATIVE
SARS Coronavirus 2 by RT PCR: NEGATIVE

## 2021-04-23 MED ORDER — BETAMETHASONE SOD PHOS & ACET 6 (3-3) MG/ML IJ SUSP
12.0000 mg | INTRAMUSCULAR | Status: DC
Start: 2021-04-24 — End: 2021-06-27

## 2021-04-23 MED ORDER — LACTATED RINGERS IV SOLN: INTRAVENOUS | Status: DC

## 2021-04-23 MED ORDER — MAGNESIUM SULFATE 40 GM/1000ML IV SOLN: 2.0000 g/h | INTRAVENOUS | Status: DC

## 2021-04-23 MED ORDER — NIFEDIPINE 10 MG PO CAPS: 10.0000 mg | ORAL_CAPSULE | Freq: Four times a day (QID) | ORAL | Status: DC

## 2021-04-23 MED ORDER — MAGNESIUM SULFATE 40 GM/1000ML IV SOLN
2.0000 g/h | INTRAVENOUS | Status: DC
  Administered 2021-04-23: 2 g/h via INTRAVENOUS
  Filled 2021-04-23: qty 1000

## 2021-04-23 MED ORDER — NIFEDIPINE 10 MG PO CAPS
30.0000 mg | ORAL_CAPSULE | Freq: Once | ORAL | Status: AC
  Administered 2021-04-23: 30 mg via ORAL
  Filled 2021-04-23: qty 3

## 2021-04-23 MED ORDER — MAGNESIUM SULFATE BOLUS VIA INFUSION
4.0000 g | Freq: Once | INTRAVENOUS | Status: AC
  Administered 2021-04-23: 4 g via INTRAVENOUS
  Filled 2021-04-23: qty 1000

## 2021-04-23 MED ORDER — LACTATED RINGERS IV SOLN: 75.0000 mL | INTRAVENOUS | 0 refills | Status: DC

## 2021-04-23 MED ORDER — BETAMETHASONE SOD PHOS & ACET 6 (3-3) MG/ML IJ SUSP
12.0000 mg | INTRAMUSCULAR | Status: DC
  Administered 2021-04-23: 12 mg via INTRAMUSCULAR
  Filled 2021-04-23: qty 5

## 2021-04-23 NOTE — OB Triage Note (Signed)
Pt is a G2P0 at [redacted]w[redacted]d presenting to L&D triage c/o contractions. Pt states she has been contracting since 19w but it got worse today. Pt is rating ctx pain 5/10. Pt also endorses back pain rating 5/10. Pt denies LOF and vaginal bleeding. +FM. Monitors applied and assessing. VSS. ?

## 2021-04-23 NOTE — Discharge Summary (Signed)
Patient ID: ?Tanya Glenn ?MRN: [id number] ?DOB/AGE: 1985-04-07 36 y.o. ? ?Admit date: 04/23/2021 ?Discharge date: 04/24/2021 ? ?Admission Diagnoses: preterm contractions, 28wks ? ?Discharge Diagnoses: Preterm labor 28wks ? ?Prenatal Procedures: NST ? ?Consults: Neonatology ? ?Significant Diagnostic Studies:  ?Results for orders placed or performed during the hospital encounter of 04/23/21 (from the past 168 hour(s))  ?Wet prep, genital  ? Collection Time: 04/23/21  9:18 PM  ? Specimen: Urine, Clean Catch  ?Result Value Ref Range  ? Yeast Wet Prep HPF POC NONE SEEN NONE SEEN  ? Trich, Wet Prep NONE SEEN NONE SEEN  ? Clue Cells Wet Prep HPF POC NONE SEEN NONE SEEN  ? WBC, Wet Prep HPF POC >=10 (A) <10  ? Sperm NONE SEEN   ?Fetal fibronectin  ? Collection Time: 04/23/21  9:18 PM  ?Result Value Ref Range  ? Fetal Fibronectin NEGATIVE NEGATIVE  ?Urinalysis, Complete w Microscopic Urine, Clean Catch  ? Collection Time: 04/23/21  9:18 PM  ?Result Value Ref Range  ? Color, Urine STRAW (A) YELLOW  ? APPearance CLEAR (A) CLEAR  ? Specific Gravity, Urine 1.006 1.005 - 1.030  ? pH 7.0 5.0 - 8.0  ? Glucose, UA NEGATIVE NEGATIVE mg/dL  ? Hgb urine dipstick NEGATIVE NEGATIVE  ? Bilirubin Urine NEGATIVE NEGATIVE  ? Ketones, ur NEGATIVE NEGATIVE mg/dL  ? Protein, ur NEGATIVE NEGATIVE mg/dL  ? Nitrite NEGATIVE NEGATIVE  ? Leukocytes,Ua NEGATIVE NEGATIVE  ? RBC / HPF 0-5 0 - 5 RBC/hpf  ? WBC, UA NONE SEEN 0 - 5 WBC/hpf  ? Bacteria, UA NONE SEEN NONE SEEN  ? Squamous Epithelial / LPF NONE SEEN 0 - 5  ?Group B strep by PCR [facility])  ? Collection Time: 04/23/21  9:26 PM  ? Specimen: Vaginal/Rectal; Genital  ?Result Value Ref Range  ? Group B strep by PCR NEGATIVE NEGATIVE  ?Type and screen [place]  ? Collection Time: 04/23/21  9:49 PM  ?Result Value Ref Range  ? ABO/RH(D) O POS   ? Antibody Screen NEG   ? Sample Expiration    ?  04/26/2021,2359 ?Performed at [facility], [address], [place], [place]  [address] ?  ?CBC  ? Collection Time: 04/23/21  9:57 PM  ?Result Value Ref Range  ? WBC 9.4 4.0 - 10.5 K/uL  ? RBC 3.58 (L) 3.87 - 5.11 MIL/uL  ? Hemoglobin 11.4 (L) 12.0 - 15.0 g/dL  ? HCT 32.5 (L) 36.0 - 46.0 %  ? MCV 90.8 80.0 - 100.0 fL  ? MCH 31.8 26.0 - 34.0 pg  ? MCHC 35.1 30.0 - 36.0 g/dL  ? RDW 13.0 11.5 - 15.5 %  ? Platelets 136 (L) 150 - 400 K/uL  ? nRBC 0.0 0.0 - 0.2 %  ?Resp Panel by RT-PCR (Flu A&B, Covid) Nasopharyngeal Swab  ? Collection Time: 04/23/21 10:00 PM  ? Specimen: Nasopharyngeal Swab; Nasopharyngeal(NP) swabs in vial transport medium  ?Result Value Ref Range  ? SARS Coronavirus 2 by RT PCR NEGATIVE NEGATIVE  ? Influenza A by PCR NEGATIVE NEGATIVE  ? Influenza B by PCR NEGATIVE NEGATIVE  ? ? ?Treatments: IV hydration, steroids: betamethasone, and tocolysis: Mag sulfate 4gram load/2gram per hour for neuroprotection and Nifedipine 67m PO loading.  ? ?Hospital Course:  ?This is a 36y.o. G2P0100 with IUP at 234w4ddmitted for preterm contractions, noted to have a cervical exam of 1-2cm.  No leaking of fluid and no bleeding.  She was initially started on magnesium sulfate for  tocolysis and neuroprotection and also received betamethasone x 1 doses, was given procardia 42m PO x 1.   She was seen by Neonatology during her stay.  [facility]. UCs slowed signficantly after procardia and Mag sulfate. [facility] provided transport.  ? ?Discharge Physical Exam:  ?BP 128/72 (BP Location: Left Arm)   Pulse (!) 107   Temp 98.5 ?F (36.9 ?C) (Axillary)   Resp 18   Ht 5' 6.5" (1.689 m)   Wt 81.2 kg   LMP 10/07/2020 (Exact Date)   SpO2 99%   BMI 28.46 kg/m?  ? ?General: NAD ?CV: RRR ?Pulm: CTABL, nl effort ?ABD: s/nd/nt, gravid ?DVT Evaluation: LE non-ttp, no evidence of DVT on exam. ? ?SVE: 1-2/50/-2, soft/midposition. Recheck prior to transport, unchanged.  ?FHT: 130bpm, mod variability, + accels, no decels ?TOCO: q2-457m ? ? ?Discharge Condition:  Stable ? ?Disposition: Discharge disposition: [facility] Not Defined ? ? ? ? ? ? ? ?Allergies as of 04/23/2021   ? ?   Reactions  ? Sulfa Antibiotics Hives  ? ?  ? ?  ?Medication List  ?  ? ?STOP taking these medications   ? ?[facility] COVID-19 Home Test Kit ?Generic drug: COVID-19 At Home Antigen Test ?  ?estradiol 0.1 MG/24HR patch ?Commonly known as: VIVELLE-DOT ?  ? ?  ? ?TAKE these medications   ? ?albuterol 108 (90 Base) MCG/ACT inhaler ?Commonly known as: VENTOLIN HFA ?INHALE 1 PUFF BY MOUTH EVERY 4 HOURS AS NEEDED FOR WHEEZING OR SHORTNESS OF BREATH. ?  ?aspirin 81 MG chewable tablet ?Chew by mouth daily. ?  ?betamethasone acetate-betamethasone sodium phosphate 6 (3-3) MG/ML injection ?Commonly known as: CELESTONE ?Inject 2 mLs (12 mg total) into the muscle daily. ?  ?lactated ringers infusion ?Inject 75 mLs into the vein continuous. ?  ?levothyroxine 50 MCG tablet ?Commonly known as: SYNTHROID ?Take 1 tablet (50 mcg total) by mouth daily before breakfast. ?What changed: Another medication with the same name was removed. Continue taking this medication, and follow the directions you see here. ?  ?magnesium sulfate 40 grams in SWI 1000 mL 40 GM/1000ML Soln ?Inject 2 g/hr into the vein continuous. ?  ?NIFEdipine 10 MG capsule ?Commonly known as: PROCARDIA ?Take 1 capsule (10 mg total) by mouth every 6 (six) hours. ?  ?omega-3 acid ethyl esters 1 g capsule ?Commonly known as: LOVAZA ?Take by mouth 2 (two) times daily. ?  ?PRENATAL 1 PO ?Take by mouth. ?  ?promethazine 25 MG tablet ?Commonly known as: PHENERGAN ?Take 0.5 tablets (12.5 mg total) by mouth every 6 (six) hours as needed for nausea or vomiting. ?  ?Qvar RediHaler 40 MCG/ACT inhaler ?Generic drug: beclomethasone ?INHALE 2 PUFFS INTO THE LUNGS TWO TIMES DAILY ?  ? ?  ? ? ? ?Signed: ?----- ?ReFrancetta FoundCNM ?04/24/2021 ?5:09 AM ?  ?

## 2021-04-23 NOTE — Consult Note (Addendum)
23 - 29 Weeks ? ?Neonatology Consult Note: ?At the request of the patients midwife R McVey CNM. I met with Ms. Whittenberg and her husband she is at 30 4/7 wks currently with preg complicated by preterm labor. Maternal history of fetal demise @ 28 weeks (05/04/20), current IVF pregnancy, hypothyroidism on Synthroid, second semester bleeding and contracting. ?We discussed morbidity/mortality at this gestional age, delivery room resuscitation, including intubation and surfactant in DR.  Discussed mechanical ventilation and risk for chronic lung disease, risk for IVH , ROP, NEC, sepsis, as well as temperature instability and feeding immaturity.  Discussed NG / OG feeds, benefits of MBM in reducing incidence of NEC.   ?Discussed likely length of stay. ?Parents verbalized understanding, all questions answered. ? ?Mother being transferred to [facility] for further care ? ?Thank you for allowing [place] to participate in her care.  Please call with questions. ? ?Neil Crouch, DNP, NNP-BC ?Neonatal Nurse Practitioner ? ?The total length of face-to-face or floor / unit time for this encounter was 30 minutes.  Counseling and / or coordination of care was greater than fifty percent of the time.     ? ? ?

## 2021-04-23 NOTE — Progress Notes (Signed)
Bedside US performed, breech presentation noted. Fetal back to left.  ?[facility] ETA 55min.  ? ?Francetta Found, CNM ?04/23/2021 ?10:43 PM ? ?

## 2021-04-23 NOTE — H&P (Signed)
Tanya Glenn is a 36 y.o. female. She is at 23w4dgestation. Patient's last menstrual period was 10/07/2020 (exact date). ?Estimated Date of Delivery: 07/12/21 ? ?[facility]  ? ?Current pregnancy complicated by:  ?1. AMA ?2. History of fetal demise at 37 weeks 05/04/2020 ?3. Bleeding in second trimester ?4. IVF pregnancy ?5. Hypothyroidism, synthroid 570m daily ?6. Narcolepsy ? ?Chief complaint: contractions ? ?- has had contractions since about 19wks, but increased frequency today q2-25m49mand accompanied by low back pain.  ?- active FM, denies VB or LOF.  ? ?S: Resting comfortably. no VB.no LOF,  Active fetal movement. Denies: HA, visual changes, SOB, or RUQ/epigastric pain ? ?Maternal Medical History:  ? ?Past Medical History:  ?Diagnosis Date  ? Anxiety   ? Asthma   ? Asthma   ? Depression   ? Family history of breast cancer   ? Family history of gene mutation   ? Narcolepsy   ? Symptomatic PVCs   ? ? ?Past Surgical History:  ?Procedure Laterality Date  ? WISDOM TOOTH EXTRACTION    ? ? ?Allergies  ?Allergen Reactions  ? Sulfa Antibiotics Hives  ? ? ?Prior to Admission medications   ?Medication Sig Start Date End Date Taking? Authorizing Provider  ?albuterol (VENTOLIN HFA) 108 (90 Base) MCG/ACT inhaler INHALE 1 PUFF BY MOUTH EVERY 4 HOURS AS NEEDED FOR WHEEZING OR SHORTNESS OF BREATH. 11/28/20 11/28/21  SonLeone HavenD  ?aspirin 81 MG chewable tablet Chew by mouth daily.    [provider]  ?beclomethasone (QVAR) 40 MCG/ACT inhaler INHALE 2 PUFFS INTO THE LUNGS TWO TIMES DAILY 11/02/20 11/02/21  SonLeone HavenD  ?COVID-19 At Home Antigen Test ([facility]) KIT use as directed 03/31/21   NicLetta MedianPH  ?estradiol (VIVELLE-DOT) 0.1 MG/24HR patch Place 1 patch onto the skin every 3 days. 10/05/20     ?levothyroxine (SYNTHROID) 50 MCG tablet Take 1 tablet (50 mcg total) by mouth daily before breakfast. 02/06/21 02/06/22  SonLeone HavenMD  ?levothyroxine (SYNTHROID) 50 MCG tablet Take 1 tablet (50 mcg total) by mouth once daily Take on an empty stomach with a glass of water at least 30-60 minutes before breakfast. 02/08/21     ?omega-3 acid ethyl esters (LOVAZA) 1 g capsule Take by mouth 2 (two) times daily.    [provider]  ?Prenatal MV-Min-Fe Fum-FA-DHA (PRENATAL 1 PO) Take by mouth.    [provider]  ?promethazine (PHENERGAN) 25 MG tablet Take 0.5 tablets (12.5 mg total) by mouth every 6 (six) hours as needed for nausea or vomiting. 03/15/21   OxlLinda HedgesNM  ? ? ? ? ?Social History: She  reports that she has never smoked. She has never used smokeless tobacco. She reports current alcohol use. She reports that she does not use drugs. ? ?Family History: family history includes Breast cancer (age of onset: 61)84n her mother; Heart disease in her maternal grandmother; Hyperlipidemia in her father; Skin cancer in her maternal aunt; Stroke in her maternal grandmother.  ? ?Review of Systems: A full review of systems was performed and negative except as noted in the HPI.   ? ? ?O: ? BP 120/77 (BP Location: Left Arm)   Pulse 99   Temp 98.8 ?F (37.1 ?C) (Oral)   Resp 20   Ht 5' 6.5" (1.689 m)   Wt 81.2 kg   LMP 10/07/2020 (Exact Date)   BMI 28.46 kg/m?  ?No results found  for this or any previous visit (from the past 48 hour(s)).  ? ?Constitutional: NAD, AAOx3  ?HE/ENT: extraocular movements grossly intact, moist mucous membranes ?CV: RRR ?PULM: nl respiratory effort, CTABL     ?Abd: gravid, non-tender, non-distended, soft      ?Ext: Non-tender, Nonedematous   ?Psych: mood appropriate, speech normal ?Pelvic: SSE done: visually dilated and thinning with mod amt cervical mucus, scant white/yellow adherent DC.  ?SVE: 1-2/50/-2, soft/midposition.  ? ?Fetal  monitoring: Cat I Appropriate for GA ?Baseline: 130bpm ?Variability: moderate ?Accelerations:  present x >2 ?Decelerations absent ? ?Toco: q38mn, palp mild.  ? ? ? ?A/P:  36y.o. 249w4dere for antenatal surveillance for Preterm labor ? ?Principle Diagnosis:  High risk pregnancy in third trimester ? ?Preterm labor with contractions and cervical dilation ?D/w Dr jaGlennon MacMagnesium 4gm/2gm-hr for neuro protection ?BMZ 1249mM 1st dose.  ?Nifedipine 33m76m loading dose, then 10mg90mq6h. ?FFN, wet prep and GBS by PCR ordered.  ? ? ?RebecFrancetta Found ?04/23/2021  ?9:28 PM ? ?

## 2021-04-23 NOTE — Progress Notes (Addendum)
?  Phone call to [facility], [phone]; transfer accepted by [facility] attending Dr Antony Contras.  ? ?Francetta Found, CNM ?04/23/2021 ?10:16 PM ? ?

## 2021-04-24 ENCOUNTER — Encounter: Payer: Self-pay | Admitting: Obstetrics & Gynecology

## 2021-04-24 ENCOUNTER — Other Ambulatory Visit: Payer: Self-pay

## 2021-04-24 DIAGNOSIS — O09813 Supervision of pregnancy resulting from assisted reproductive technology, third trimester: Secondary | ICD-10-CM | POA: Diagnosis not present

## 2021-04-24 DIAGNOSIS — O09523 Supervision of elderly multigravida, third trimester: Secondary | ICD-10-CM | POA: Diagnosis not present

## 2021-04-24 DIAGNOSIS — Z3A28 28 weeks gestation of pregnancy: Secondary | ICD-10-CM | POA: Diagnosis not present

## 2021-04-24 DIAGNOSIS — Z3689 Encounter for other specified antenatal screening: Secondary | ICD-10-CM | POA: Diagnosis not present

## 2021-04-24 DIAGNOSIS — Z7982 Long term (current) use of aspirin: Secondary | ICD-10-CM | POA: Diagnosis not present

## 2021-04-24 DIAGNOSIS — O321XX0 Maternal care for breech presentation, not applicable or unspecified: Secondary | ICD-10-CM | POA: Diagnosis not present

## 2021-04-24 DIAGNOSIS — O99513 Diseases of the respiratory system complicating pregnancy, third trimester: Secondary | ICD-10-CM | POA: Diagnosis not present

## 2021-04-24 DIAGNOSIS — Z79899 Other long term (current) drug therapy: Secondary | ICD-10-CM | POA: Diagnosis not present

## 2021-04-24 DIAGNOSIS — Z8759 Personal history of other complications of pregnancy, childbirth and the puerperium: Secondary | ICD-10-CM | POA: Diagnosis not present

## 2021-04-24 DIAGNOSIS — O99283 Endocrine, nutritional and metabolic diseases complicating pregnancy, third trimester: Secondary | ICD-10-CM | POA: Diagnosis not present

## 2021-04-24 DIAGNOSIS — O09293 Supervision of pregnancy with other poor reproductive or obstetric history, third trimester: Secondary | ICD-10-CM | POA: Diagnosis not present

## 2021-04-24 DIAGNOSIS — Z3A29 29 weeks gestation of pregnancy: Secondary | ICD-10-CM | POA: Diagnosis not present

## 2021-04-24 DIAGNOSIS — E039 Hypothyroidism, unspecified: Secondary | ICD-10-CM | POA: Diagnosis not present

## 2021-04-24 DIAGNOSIS — J4520 Mild intermittent asthma, uncomplicated: Secondary | ICD-10-CM | POA: Diagnosis not present

## 2021-04-24 LAB — RPR: RPR Ser Ql: NONREACTIVE

## 2021-04-25 DIAGNOSIS — R030 Elevated blood-pressure reading, without diagnosis of hypertension: Secondary | ICD-10-CM | POA: Insufficient documentation

## 2021-04-27 DIAGNOSIS — O4702 False labor before 37 completed weeks of gestation, second trimester: Secondary | ICD-10-CM | POA: Insufficient documentation

## 2021-05-10 ENCOUNTER — Other Ambulatory Visit: Payer: Self-pay

## 2021-05-10 DIAGNOSIS — E038 Other specified hypothyroidism: Secondary | ICD-10-CM | POA: Diagnosis not present

## 2021-05-10 DIAGNOSIS — E063 Autoimmune thyroiditis: Secondary | ICD-10-CM | POA: Diagnosis not present

## 2021-05-10 DIAGNOSIS — O09213 Supervision of pregnancy with history of pre-term labor, third trimester: Secondary | ICD-10-CM | POA: Diagnosis not present

## 2021-05-10 DIAGNOSIS — O09293 Supervision of pregnancy with other poor reproductive or obstetric history, third trimester: Secondary | ICD-10-CM | POA: Diagnosis not present

## 2021-05-10 MED ORDER — NIFEDIPINE 10 MG PO CAPS
ORAL_CAPSULE | ORAL | 0 refills | Status: DC
  Filled 2021-05-10: qty 8, 2d supply, fill #0

## 2021-05-17 DIAGNOSIS — Z23 Encounter for immunization: Secondary | ICD-10-CM | POA: Diagnosis not present

## 2021-05-23 DIAGNOSIS — E039 Hypothyroidism, unspecified: Secondary | ICD-10-CM | POA: Diagnosis not present

## 2021-05-23 DIAGNOSIS — O99283 Endocrine, nutritional and metabolic diseases complicating pregnancy, third trimester: Secondary | ICD-10-CM | POA: Diagnosis not present

## 2021-05-31 DIAGNOSIS — Z3483 Encounter for supervision of other normal pregnancy, third trimester: Secondary | ICD-10-CM | POA: Diagnosis not present

## 2021-05-31 DIAGNOSIS — Z3482 Encounter for supervision of other normal pregnancy, second trimester: Secondary | ICD-10-CM | POA: Diagnosis not present

## 2021-06-07 DIAGNOSIS — E038 Other specified hypothyroidism: Secondary | ICD-10-CM | POA: Diagnosis not present

## 2021-06-07 DIAGNOSIS — E063 Autoimmune thyroiditis: Secondary | ICD-10-CM | POA: Diagnosis not present

## 2021-06-14 DIAGNOSIS — E038 Other specified hypothyroidism: Secondary | ICD-10-CM | POA: Diagnosis not present

## 2021-06-14 DIAGNOSIS — O99283 Endocrine, nutritional and metabolic diseases complicating pregnancy, third trimester: Secondary | ICD-10-CM | POA: Diagnosis not present

## 2021-06-14 DIAGNOSIS — Z113 Encounter for screening for infections with a predominantly sexual mode of transmission: Secondary | ICD-10-CM | POA: Diagnosis not present

## 2021-06-14 DIAGNOSIS — Z3A36 36 weeks gestation of pregnancy: Secondary | ICD-10-CM | POA: Diagnosis not present

## 2021-06-14 DIAGNOSIS — Z114 Encounter for screening for human immunodeficiency virus [HIV]: Secondary | ICD-10-CM | POA: Diagnosis not present

## 2021-06-14 DIAGNOSIS — E063 Autoimmune thyroiditis: Secondary | ICD-10-CM | POA: Diagnosis not present

## 2021-06-14 DIAGNOSIS — E079 Disorder of thyroid, unspecified: Secondary | ICD-10-CM | POA: Diagnosis not present

## 2021-06-14 DIAGNOSIS — O0993 Supervision of high risk pregnancy, unspecified, third trimester: Secondary | ICD-10-CM | POA: Diagnosis not present

## 2021-06-14 LAB — OB RESULTS CONSOLE GC/CHLAMYDIA
Chlamydia: NEGATIVE
Gonorrhea: NEGATIVE

## 2021-06-19 DIAGNOSIS — O09523 Supervision of elderly multigravida, third trimester: Secondary | ICD-10-CM | POA: Diagnosis not present

## 2021-06-25 ENCOUNTER — Encounter: Payer: Self-pay | Admitting: Obstetrics and Gynecology

## 2021-06-25 ENCOUNTER — Other Ambulatory Visit: Payer: Self-pay

## 2021-06-25 ENCOUNTER — Inpatient Hospital Stay
Admission: EM | Admit: 2021-06-25 | Discharge: 2021-06-27 | DRG: 807 | Disposition: A | Discharge status: Home / Self Care | Payer: 59 | Attending: Obstetrics and Gynecology | Admitting: Obstetrics and Gynecology

## 2021-06-25 DIAGNOSIS — J45909 Unspecified asthma, uncomplicated: Secondary | ICD-10-CM

## 2021-06-25 DIAGNOSIS — E039 Hypothyroidism, unspecified: Secondary | ICD-10-CM | POA: Diagnosis present

## 2021-06-25 DIAGNOSIS — O26893 Other specified pregnancy related conditions, third trimester: Secondary | ICD-10-CM | POA: Diagnosis present

## 2021-06-25 DIAGNOSIS — O09819 Supervision of pregnancy resulting from assisted reproductive technology, unspecified trimester: Secondary | ICD-10-CM

## 2021-06-25 DIAGNOSIS — O0990 Supervision of high risk pregnancy, unspecified, unspecified trimester: Secondary | ICD-10-CM

## 2021-06-25 DIAGNOSIS — Z3A37 37 weeks gestation of pregnancy: Secondary | ICD-10-CM

## 2021-06-25 DIAGNOSIS — O9081 Anemia of the puerperium: Secondary | ICD-10-CM | POA: Diagnosis not present

## 2021-06-25 DIAGNOSIS — O99284 Endocrine, nutritional and metabolic diseases complicating childbirth: Secondary | ICD-10-CM | POA: Diagnosis not present

## 2021-06-25 DIAGNOSIS — O09529 Supervision of elderly multigravida, unspecified trimester: Secondary | ICD-10-CM

## 2021-06-25 DIAGNOSIS — O09299 Supervision of pregnancy with other poor reproductive or obstetric history, unspecified trimester: Secondary | ICD-10-CM

## 2021-06-25 DIAGNOSIS — O99280 Endocrine, nutritional and metabolic diseases complicating pregnancy, unspecified trimester: Secondary | ICD-10-CM

## 2021-06-25 LAB — CBC
HCT: 33.1 % — ABNORMAL LOW (ref 36.0–46.0)
Hemoglobin: 11.4 g/dL — ABNORMAL LOW (ref 12.0–15.0)
MCH: 31.6 pg (ref 26.0–34.0)
MCHC: 34.4 g/dL (ref 30.0–36.0)
MCV: 91.7 fL (ref 80.0–100.0)
Platelets: 101 10*3/uL — ABNORMAL LOW (ref 150–400)
RBC: 3.61 MIL/uL — ABNORMAL LOW (ref 3.87–5.11)
RDW: 12.5 % (ref 11.5–15.5)
WBC: 8.6 10*3/uL (ref 4.0–10.5)
nRBC: 0 % (ref 0.0–0.2)

## 2021-06-25 MED ORDER — SOD CITRATE-CITRIC ACID 500-334 MG/5ML PO SOLN: 30.0000 mL | ORAL | Status: DC | PRN

## 2021-06-25 MED ORDER — LACTATED RINGERS IV SOLN: INTRAVENOUS | Status: DC

## 2021-06-25 MED ORDER — AMMONIA AROMATIC IN INHA
RESPIRATORY_TRACT | Status: AC
  Filled 2021-06-25: qty 10

## 2021-06-25 MED ORDER — FENTANYL-BUPIVACAINE-NACL 0.5-0.125-0.9 MG/250ML-% EP SOLN
EPIDURAL | Status: AC
  Filled 2021-06-25: qty 250

## 2021-06-25 MED ORDER — OXYTOCIN 10 UNIT/ML IJ SOLN: 10.0000 [IU] | Freq: Once | INTRAMUSCULAR | Status: DC

## 2021-06-25 MED ORDER — OXYTOCIN BOLUS FROM INFUSION
333.0000 mL | Freq: Once | INTRAVENOUS | Status: AC
  Administered 2021-06-26: 333 mL via INTRAVENOUS

## 2021-06-25 MED ORDER — LIDOCAINE HCL (PF) 1 % IJ SOLN
INTRAMUSCULAR | Status: AC
  Filled 2021-06-25: qty 30

## 2021-06-25 MED ORDER — LACTATED RINGERS IV SOLN
500.0000 mL | INTRAVENOUS | Status: DC | PRN
  Administered 2021-06-26: 500 mL via INTRAVENOUS

## 2021-06-25 MED ORDER — OXYTOCIN-SODIUM CHLORIDE 30-0.9 UT/500ML-% IV SOLN
2.5000 [IU]/h | INTRAVENOUS | Status: DC
  Filled 2021-06-25: qty 500

## 2021-06-25 MED ORDER — ONDANSETRON HCL 4 MG/2ML IJ SOLN
4.0000 mg | Freq: Four times a day (QID) | INTRAMUSCULAR | Status: DC | PRN
  Administered 2021-06-26: 4 mg via INTRAVENOUS
  Filled 2021-06-25: qty 2

## 2021-06-25 MED ORDER — OXYTOCIN 10 UNIT/ML IJ SOLN
INTRAMUSCULAR | Status: AC
  Filled 2021-06-25: qty 2

## 2021-06-25 MED ORDER — LIDOCAINE HCL (PF) 1 % IJ SOLN
30.0000 mL | INTRAMUSCULAR | Status: DC | PRN
  Filled 2021-06-25: qty 30

## 2021-06-25 MED ORDER — MISOPROSTOL 200 MCG PO TABS
ORAL_TABLET | ORAL | Status: AC
  Filled 2021-06-25: qty 4

## 2021-06-25 NOTE — H&P (Signed)
OB History & Physical  ? ?History of Present Illness:  ?Chief Complaint: regular uterine contractions ? ?HPI:  ?Tanya Glenn is Glenn 36 y.o. G58P0100 female at [redacted]w[redacted]d dated by embryo transfer.  Her pregnancy has been complicated by history of fetal demise of G1 at 48 weeks, advanced maternal age, pregnancy resulting from assisted reproductive technology (negative fetal ECHO), hypothyroidism, preterm labor in third trimester .   ? ?She reports contractions most of the day, worse than previous.   She denies leakage of fluid.   She denies vaginal bleeding.   She reports fetal movement.   ? ?Total weight gain for pregnancy: 13.2 kg  ? ?Obstetrical Problem List: ?pregnancy Problems (from 12/19/20 to present)   ? ? Problem Noted Resolved  ? Prior pregnancy with fetal demise 12/19/2020 by Tanya Novak, Tanya Glenn No  ? Overview Signed 12/19/2020  3:15 PM by Tanya Novak, Tanya Glenn  ?  At 28 weeks, ?nuchal cord. No other etiology found, ? Elevated anticardiolipin abs ? ?  ?  ? Hypothyroid in pregnancy, antepartum 12/19/2020 by Tanya Novak, Tanya Glenn No  ? Overview Signed 12/19/2020  3:44 PM by Tanya Novak, Tanya Glenn  ?  - on levothyroxine 50 mcg ?- followed [facility] Tedd Glenn [facility] ? ?  ?  ? Supervision of high risk pregnancy, antepartum 01/04/2020 by Tanya Novak, Tanya Glenn No  ? Overview Addendum 12/19/2020  3:12 PM by Tanya Novak, Tanya Glenn  ?  [facility] Prenatal Labs  ?Dating  Blood type: O/Positive/-- (11/15 1455)   ?Genetic Screen NIPS: Antibody:Negative (11/15 1455)  ?Anatomic [place]  Rubella: 8.19 (11/15 1455)  ?Varicella: immune  ?GTT Early:               Third trimester:  RPR: Non Reactive (11/15 1455)   ?Rhogam  HBsAg: Negative (11/15 1455)   ?TDaP vaccine                       Flu Shot: HIV: Non Reactive (11/15 1455)   ?Baby Food                                GBS:   ?Contraception  Pap: up to date, normal  ?CBB     ?CS/VBAC    ?Support Person Husband: Tanya Glenn   ? ? ? ?  ?  ? Antepartum multigravida of advanced  maternal age 87/15/2021 by Tanya Novak, Tanya Glenn No  ? Overview Addendum 01/08/2020 12:32 PM by Tanya Novak, Tanya Glenn  ?  [ ]  NIPS testing pending, Euploid on PGS ? ?  ?  ? Asthma during pregnancy 01/04/2020 by 01/06/2020, Tanya Glenn No  ? ?  ?  ? ?Maternal Medical History:  ? ?Past Medical History:  ?Diagnosis Date  ? Anxiety   ? Asthma   ? Asthma   ? Depression   ? Family history of breast cancer   ? Family history of gene mutation   ? Narcolepsy   ? Symptomatic PVCs   ? ? ?Past Surgical History:  ?Procedure Laterality Date  ? WISDOM TOOTH EXTRACTION    ? ? ?Allergies  ?Allergen Reactions  ? Sulfa Antibiotics Hives  ? ? ?Prior to Admission medications   ?Medication Sig Start Date End Date Taking? Authorizing Provider  ?aspirin 81 MG chewable tablet Chew by mouth daily.   Yes Provider, Historical, Tanya Glenn  ?levothyroxine (SYNTHROID) 50 MCG tablet Take  1 tablet (50 mcg total) by mouth daily before breakfast. 02/06/21 02/06/22 Yes Tanya Glenn, Tanya Glenn, Tanya Glenn  ?Prenatal MV-Min-Fe Fum-FA-DHA (PRENATAL 1 PO) Take by mouth.   Yes Provider, Historical, Tanya Glenn  ?albuterol (VENTOLIN HFA) 108 (90 Base) MCG/ACT inhaler INHALE 1 PUFF BY MOUTH EVERY 4 HOURS AS NEEDED FOR WHEEZING OR SHORTNESS OF BREATH. 11/28/20 11/28/21  Tanya Glenn, Tanya Glenn, Tanya Glenn  ?beclomethasone (QVAR) 40 MCG/ACT inhaler INHALE 2 PUFFS INTO THE LUNGS TWO TIMES DAILY 11/02/20 11/02/21  Tanya Glenn, Tanya Glenn, Tanya Glenn  ?betamethasone acetate-betamethasone sodium phosphate (CELESTONE) 6 (3-3) MG/ML injection Inject 2 mLs (12 mg total) into the muscle daily. 04/24/21   Tanya Glenn, Tanya Glenn, Tanya Glenn  ?lactated ringers infusion Inject 75 mLs into the vein continuous. 04/23/21   Tanya Glenn, Tanya Glenn, Tanya Glenn  ?magnesium sulfate 40 grams in SWI 1000 mL 40 GM/1000ML SOLN Inject 2 Glenn/hr into the vein continuous. 04/23/21   Tanya Glenn, Tanya Glenn, Tanya Glenn  ?NIFEdipine (PROCARDIA) 10 MG capsule Take 1 capsule (10 mg total) by mouth every 6 (six) hours. 04/24/21   Tanya Glenn, Tanya Glenn, Tanya Glenn  ?NIFEdipine (PROCARDIA) 10 MG capsule Take 1  capsule (10 mg total) by mouth every 6 (six) hours for 2 days 05/10/21     ?omega-3 acid ethyl esters (LOVAZA) 1 Glenn capsule Take by mouth 2 (two) times daily.    Provider, Historical, Tanya Glenn  ?promethazine (PHENERGAN) 25 MG tablet Take 0.5 tablets (12.5 mg total) by mouth every 6 (six) hours as needed for nausea or vomiting. 03/15/21   Tanya Glenn, Tanya Glenn, Tanya Glenn  ? ? ?OB History  ?Gravida Para Term Preterm AB Living  ?2 1 0 1 0 0  ?SAB IAB Ectopic Multiple Live Births  ?0 0 0 0    ?  ?# Outcome Date GA Lbr Len/2nd Weight Sex Delivery Anes PTL Lv  ?2 Current           ?1 Preterm 05/04/20 [id number] / 00:13 990 Glenn F Vag-Spont EPI  FD  ? ? ?[facility] ? ?Social History: She  reports that she has never smoked. She has never used smokeless tobacco. She reports current alcohol use. She reports that she does not use drugs. ? ?Family History: family history includes Breast cancer (age of onset: 5761) in her mother; Heart disease in her maternal grandmother; Hyperlipidemia in her father; Skin cancer in her maternal aunt; Stroke in her maternal grandmother.  ? ?Review of Systems  ?Constitutional: Negative.   ?HENT: Negative.    ?Eyes: Negative.   ?Respiratory: Negative.    ?Cardiovascular: Negative.   ?Gastrointestinal:  Positive for abdominal pain (contractions).  ?Genitourinary: Negative.   ?Musculoskeletal: Negative.   ?Skin: Negative.   ?Neurological: Negative.   ?Psychiatric/Behavioral: Negative.     ? ?Physical Exam:  ?Ht 5\' 6"  (1.676 m)   Wt 83.5 kg   LMP 10/07/2020 (Exact Date)   BMI 29.70 kg/m?   ?Physical Exam ?Constitutional:   ?   General: She is not in acute distress. ?   Appearance: Normal appearance.  ?Genitourinary:  ?   Genitourinary Comments: 6.5/70/-1 per RN  ?HENT:  ?   Head: Normocephalic and atraumatic.  ?Eyes:  ?   General: No scleral icterus. ?   Conjunctiva/sclera: Conjunctivae normal.  ?Abdominal:  ?   Palpations: There is mass (gravid, NT).  ?Neurological:  ?   General: No focal deficit  present.  ?   Mental Status: She is alert and oriented to person, place, and time.  ?   Cranial Nerves: No cranial  nerve deficit.  ?Psychiatric:     ?   Mood and Affect: Mood normal.     ?   Behavior: Behavior normal.     ?   Judgment: Judgment normal.  ?  ? ?Baseline FHR: 125 beats/min   Variability: moderate   Accelerations: present   Decelerations: absent ?Contractions: present frequency: 3 q 10 min ?Overall assessment: cat 1 ? ?Recent ultrasound shows fetus in cephalic presentation, EFW 25th%ile, AFI 16 cm ? ?Assessment:  ?Avigail Pilling is Glenn 36 y.o. G46P0100 female at [redacted]w[redacted]d with normal labor.  ? ?Plan:  ?Admit to Labor & Delivery  ?CBC, T&S, Clrs, IVF ?GBS negative on 06/14/21.   ?Fetwal well-being: reassuring ?Expectant management.  ? ?Tanya Mohair, Tanya Glenn ?06/25/2021 11:49 PM  ?  ?

## 2021-06-26 ENCOUNTER — Inpatient Hospital Stay: Payer: 59 | Admitting: Anesthesiology

## 2021-06-26 ENCOUNTER — Encounter: Payer: Self-pay | Admitting: Obstetrics and Gynecology

## 2021-06-26 DIAGNOSIS — Z3A37 37 weeks gestation of pregnancy: Secondary | ICD-10-CM | POA: Diagnosis not present

## 2021-06-26 LAB — TYPE AND SCREEN
ABO/RH(D): O POS
Antibody Screen: NEGATIVE

## 2021-06-26 LAB — RPR: RPR Ser Ql: NONREACTIVE

## 2021-06-26 MED ORDER — WITCH HAZEL-GLYCERIN EX PADS
1.0000 | MEDICATED_PAD | CUTANEOUS | Status: DC | PRN
Start: 2021-06-26 — End: 2021-06-27
  Administered 2021-06-26 – 2021-06-27 (×2): 1 via TOPICAL
  Filled 2021-06-26 (×2): qty 100

## 2021-06-26 MED ORDER — PHENYLEPHRINE 80 MCG/ML (10ML) SYRINGE FOR IV PUSH (FOR BLOOD PRESSURE SUPPORT)
80.0000 ug | PREFILLED_SYRINGE | INTRAVENOUS | Status: DC | PRN
  Filled 2021-06-26 (×2): qty 10

## 2021-06-26 MED ORDER — EPHEDRINE 5 MG/ML INJ
10.0000 mg | INTRAVENOUS | Status: DC | PRN
  Filled 2021-06-26: qty 2

## 2021-06-26 MED ORDER — EPHEDRINE 5 MG/ML INJ
10.0000 mg | INTRAVENOUS | Status: DC | PRN
  Filled 2021-06-26: qty 4
  Filled 2021-06-26: qty 2

## 2021-06-26 MED ORDER — ACETAMINOPHEN 325 MG PO TABS
650.0000 mg | ORAL_TABLET | ORAL | Status: DC | PRN
  Administered 2021-06-26 (×2): 650 mg via ORAL
  Filled 2021-06-26 (×2): qty 2

## 2021-06-26 MED ORDER — PRENATAL MULTIVITAMIN CH
1.0000 | ORAL_TABLET | Freq: Every day | ORAL | Status: DC
  Administered 2021-06-26 – 2021-06-27 (×2): 1 via ORAL
  Filled 2021-06-26 (×2): qty 1

## 2021-06-26 MED ORDER — LIDOCAINE HCL (PF) 1 % IJ SOLN
INTRAMUSCULAR | Status: DC | PRN
  Administered 2021-06-26: 3 mL via SUBCUTANEOUS

## 2021-06-26 MED ORDER — ALBUTEROL SULFATE (2.5 MG/3ML) 0.083% IN NEBU: 2.5000 mg | INHALATION_SOLUTION | RESPIRATORY_TRACT | Status: DC | PRN

## 2021-06-26 MED ORDER — BENZOCAINE-MENTHOL 20-0.5 % EX AERO
1.0000 "application " | INHALATION_SPRAY | CUTANEOUS | Status: DC | PRN
  Administered 2021-06-27: 1 via TOPICAL
  Filled 2021-06-26 (×2): qty 56

## 2021-06-26 MED ORDER — DIBUCAINE (PERIANAL) 1 % EX OINT
1.0000 | TOPICAL_OINTMENT | CUTANEOUS | Status: DC | PRN
Start: 2021-06-26 — End: 2021-06-27
  Administered 2021-06-26: 1 via RECTAL
  Filled 2021-06-26: qty 28

## 2021-06-26 MED ORDER — ZOLPIDEM TARTRATE 5 MG PO TABS: 5.0000 mg | ORAL_TABLET | Freq: Every evening | ORAL | Status: DC | PRN

## 2021-06-26 MED ORDER — ONDANSETRON HCL 4 MG/2ML IJ SOLN: 4.0000 mg | INTRAMUSCULAR | Status: DC | PRN

## 2021-06-26 MED ORDER — SENNOSIDES-DOCUSATE SODIUM 8.6-50 MG PO TABS
2.0000 | ORAL_TABLET | Freq: Every day | ORAL | Status: DC
  Administered 2021-06-26 – 2021-06-27 (×2): 2 via ORAL
  Filled 2021-06-26 (×2): qty 2

## 2021-06-26 MED ORDER — FENTANYL-BUPIVACAINE-NACL 0.5-0.125-0.9 MG/250ML-% EP SOLN
12.0000 mL/h | EPIDURAL | Status: DC | PRN
  Administered 2021-06-26: 12 mL/h via EPIDURAL

## 2021-06-26 MED ORDER — LEVOTHYROXINE SODIUM 50 MCG PO TABS: 50.0000 ug | ORAL_TABLET | Freq: Every day | ORAL | Status: DC

## 2021-06-26 MED ORDER — OXYTOCIN-SODIUM CHLORIDE 30-0.9 UT/500ML-% IV SOLN
1.0000 m[IU]/min | INTRAVENOUS | Status: DC
  Administered 2021-06-26: 2 m[IU]/min via INTRAVENOUS
  Filled 2021-06-26: qty 500

## 2021-06-26 MED ORDER — DIPHENHYDRAMINE HCL 25 MG PO CAPS: 25.0000 mg | ORAL_CAPSULE | Freq: Four times a day (QID) | ORAL | Status: DC | PRN

## 2021-06-26 MED ORDER — TETANUS-DIPHTH-ACELL PERTUSSIS 5-2.5-18.5 LF-MCG/0.5 IM SUSY
0.5000 mL | PREFILLED_SYRINGE | Freq: Once | INTRAMUSCULAR | Status: DC
Start: 2021-06-27 — End: 2021-06-27
  Filled 2021-06-26: qty 0.5

## 2021-06-26 MED ORDER — BUPIVACAINE HCL (PF) 0.25 % IJ SOLN
INTRAMUSCULAR | Status: DC | PRN
  Administered 2021-06-26 (×2): 5 mL via EPIDURAL

## 2021-06-26 MED ORDER — PRENATAL MULTIVITAMIN CH: 1.0000 | ORAL_TABLET | Freq: Every day | ORAL | Status: DC

## 2021-06-26 MED ORDER — LACTATED RINGERS IV SOLN
500.0000 mL | Freq: Once | INTRAVENOUS | Status: AC
  Administered 2021-06-26: 500 mL via INTRAVENOUS

## 2021-06-26 MED ORDER — LIDOCAINE-EPINEPHRINE (PF) 1.5 %-1:200000 IJ SOLN
INTRAMUSCULAR | Status: DC | PRN
  Administered 2021-06-26: 3 mL via EPIDURAL

## 2021-06-26 MED ORDER — PHENYLEPHRINE 80 MCG/ML (10ML) SYRINGE FOR IV PUSH (FOR BLOOD PRESSURE SUPPORT)
80.0000 ug | PREFILLED_SYRINGE | INTRAVENOUS | Status: DC | PRN
  Administered 2021-06-26: 80 ug via INTRAVENOUS
  Filled 2021-06-26 (×2): qty 10

## 2021-06-26 MED ORDER — LEVOTHYROXINE SODIUM 25 MCG PO TABS
25.0000 ug | ORAL_TABLET | Freq: Every day | ORAL | Status: DC
  Administered 2021-06-27: 25 ug via ORAL
  Filled 2021-06-26: qty 1

## 2021-06-26 MED ORDER — DIPHENHYDRAMINE HCL 50 MG/ML IJ SOLN: 12.5000 mg | INTRAMUSCULAR | Status: DC | PRN

## 2021-06-26 MED ORDER — TERBUTALINE SULFATE 1 MG/ML IJ SOLN: 0.2500 mg | Freq: Once | INTRAMUSCULAR | Status: DC | PRN

## 2021-06-26 MED ORDER — SIMETHICONE 80 MG PO CHEW
80.0000 mg | CHEWABLE_TABLET | ORAL | Status: DC | PRN
Start: 2021-06-26 — End: 2021-06-27
  Administered 2021-06-27: 80 mg via ORAL
  Filled 2021-06-26: qty 1

## 2021-06-26 MED ORDER — IBUPROFEN 600 MG PO TABS
600.0000 mg | ORAL_TABLET | Freq: Four times a day (QID) | ORAL | Status: DC
  Administered 2021-06-26 – 2021-06-27 (×5): 600 mg via ORAL
  Filled 2021-06-26 (×5): qty 1

## 2021-06-26 MED ORDER — ONDANSETRON HCL 4 MG PO TABS
4.0000 mg | ORAL_TABLET | ORAL | Status: DC | PRN
Start: 2021-06-26 — End: 2021-06-27

## 2021-06-26 MED ORDER — SENNOSIDES-DOCUSATE SODIUM 8.6-50 MG PO TABS: 2.0000 | ORAL_TABLET | Freq: Every day | ORAL | Status: DC

## 2021-06-26 MED ORDER — COCONUT OIL OIL
1.0000 "application " | TOPICAL_OIL | Status: DC | PRN
  Administered 2021-06-26: 1 via TOPICAL
  Filled 2021-06-26: qty 120

## 2021-06-26 NOTE — Anesthesia Procedure Notes (Signed)
Epidural ?Patient location during procedure: OB ?Start time: 06/26/2021 12:12 AM ?End time: 06/26/2021 12:16 AM ? ?Staffing ?Anesthesiologist: Lenard Simmer, MD ?Performed: anesthesiologist  ? ?Preanesthetic Checklist ?Completed: patient identified, IV checked, site marked, risks and benefits discussed, surgical consent, monitors and equipment checked, pre-op evaluation and timeout performed ? ?Epidural ?Patient position: sitting ?Prep: ChloraPrep ?Patient monitoring: heart rate, continuous pulse ox and blood pressure ?Approach: midline ?Location: L3-L4 ?Injection technique: LOR saline ? ?Needle:  ?Needle type: Tuohy  ?Needle gauge: 17 G ?Needle length: 9 cm and 9 ?Needle insertion depth: 5 cm ?Catheter type: closed end flexible ?Catheter size: 19 Gauge ?Catheter at skin depth: 10 cm ?Test dose: negative and 1.5% lidocaine with Epi 1:200 K ? ?Assessment ?Sensory level: T10 ?Events: blood not aspirated, injection not painful, no injection resistance, no paresthesia and negative IV test ? ?Additional Notes ?1st attempt ?Pt. Evaluated and documentation done after procedure finished. ?Patient identified. Risks/Benefits/Options discussed with patient including but not limited to bleeding, infection, nerve damage, paralysis, failed block, incomplete pain control, headache, blood pressure changes, nausea, vomiting, reactions to medication both or allergic, itching and postpartum back pain. Confirmed with bedside nurse the patient's most recent platelet count. Confirmed with patient that they are not currently taking any anticoagulation, have any bleeding history or any family history of bleeding disorders. Patient expressed understanding and wished to proceed. All questions were answered. Sterile technique was used throughout the entire procedure. Please see nursing notes for vital signs. Test dose was given through epidural catheter and negative prior to continuing to dose epidural or start infusion. Warning signs of high  block given to the patient including shortness of breath, tingling/numbness in hands, complete motor block, or any concerning symptoms with instructions to call for help. Patient was given instructions on fall risk and not to get out of bed. All questions and concerns addressed with instructions to call with any issues or inadequate analgesia.   ? ?Patient tolerated the insertion well without immediate complications.Reason for block:procedure for pain ? ? ? ?

## 2021-06-26 NOTE — Discharge Summary (Signed)
Obstetrical Discharge Summary ? ?Patient Name: Tanya Glenn ?DOB: 1986/01/31 ?MRN: [id number] ? ?Date of Admission: 06/25/2021 ?Date of Delivery: 06/26/21 ?Delivered by: Prentice Docker MD ?Date of Discharge: 06/27/2021 ? ?Primary OB: [place]  ?HWT:[id number]'L last menstrual period was 10/07/2020 (exact date). ?EDC Estimated Date of Delivery: 07/12/21 ?Gestational Age at Delivery: [redacted]w[redacted]d ? ?Antepartum complications:  ?1. AMA ?2. History of fetal demise at 262 weeks?3. Bleeding in second trimester ?4. IVF pregnancy ?? Echo scheduled 03/13/21 - done and normal ?5. Hypothyroidism, synthroid 525m daily (to change to 2528mdaily after delivery) ?6. Narcolepsy ?7. Mild gestational thrombocytopenia: platelets 142 at 27 weeks ? ?Admitting Diagnosis: active labor, 37wks ?Secondary Diagnosis: SVD, 2nd deg lac ? ?Patient Active Problem List  ? Diagnosis Date Noted  ? Normal labor 06/25/2021  ? Preterm uterine contractions in third trimester, antepartum 04/23/2021  ? Nausea & vomiting 03/15/2021  ? Second trimester bleeding 02/14/2021  ? Genetic testing 01/04/2021  ? 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 Family history of breast cancer 12/21/2020  ? Family history of gene mutation 12/21/2020  ? Prior pregnancy with fetal demise 12/19/2020  ? Hypothyroid in pregnancy, antepartum 12/19/2020  ? Varicose veins of leg with swelling, 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  ? Low TSH level 08/15/2020  ? 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  ? [place]rombocytopenia ([place]/20[place]  ? Supervision of high risk pregnancy, antepartum 01/04/2020  ? Encounter for supervision of pregnancy resulting from assisted reproductive technology 01/04/2020  ? Antepartum multigravida of advanced maternal age 66/15/2021  ? Asthma during pregnancy 01/04/2020  ? Dizziness 09/01/2019  ? Lipid screening 04/29/2018  ? BPPV (benign paroxysmal positional vertigo) 06/26/2017  ? GERD (gastroesophageal reflux disease) 06/26/2017  ? Narcolepsy 12/21/2016  ?  Palpitations 10/08/2016  ? Hypokalemia 10/08/2016  ? Asthma, mild intermittent 02/16/2016  ? Leg pain, right 09/26/2015  ? Anxiety and depression 02/05/2012  ? 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 ? ?Augmentation: AROM and Pitocin ?Complications: None ?Intrapartum complications/course: admitted in active labor, augment with PItocin and AROM. See delivery note for further details.  ?Date of Delivery: 06/26/21 ?Delivered By: JacGlennon Mac ?Delivery Type: spontaneous vaginal delivery ?Anesthesia: epidural ?Placenta: spontaneous ?Laceration: 2nd deg lac ?Episiotomy: none ?Newborn Data: ?Live born female ?Birth Weight:  7lb 11.1oz ?APGAR: 9, 9 ? ?Newborn Delivery   ?Birth date/time: 06/26/2021 10:02:00 ?Delivery type: Vaginal, Spontaneous ?  ? ? ?Postpartum Procedures: none ? ?Edinburgh:  ?   ? View : No data to display.  ?  ?  ?  ?  ? ? ?Post partum course:  ?Patient had an uncomplicated postpartum course.  She considered staying until PPD#2 for infant circumcision, but decided she would like to be discharged on PPD#1. By time of discharge on PPD#1, her pain was controlled on oral pain medications; she had appropriate lochia and was ambulating, voiding without difficulty and tolerating regular diet.  She was deemed stable for discharge to home.   ? ?Discharge Physical Exam:   ?BP 100/70 (BP Location: Left Arm)   Pulse 88   Temp 98.1 ?F (36.7 ?C) (Oral)   Resp 18   Ht 5' 6" (1.676 m)   Wt 83.5 kg   LMP 10/07/2020 (Exact Date)   SpO2 100%   Breastfeeding Unknown   BMI 29.70 kg/m?  ? ?General: NAD ?CV: RRR ?Pulm: CTABL, nl effort ?ABD: s/nd/nt, fundus firm and below the umbilicus ?Lochia: moderate ?Perineum: well approximated ?DVT Evaluation: LE non-ttp, no evidence of DVT on exam. ? ?Hemoglobin  ?Date Value  Ref Range Status  ?06/27/2021 9.4 (L) 12.0 - 15.0 g/dL Final  ?12/19/2020 13.2 11.1 - 15.9 g/dL Final  ? ?HCT  ?Date Value Ref Range Status  ?06/27/2021 27.9 (L) 36.0 - 46.0 % Final  ? ?Hematocrit  ?Date Value Ref Range  Status  ?12/19/2020 38.9 34.0 - 46.6 % Final  ? ? ? ?Disposition: stable, discharge to home. ?Baby Feeding: breastmilk ?Baby Disposition: home with mom ? ?Rh Immune globulin given: n/a ?Rubella vaccine given: n/a, immune ?Varicella vaccine given: n/a, immune ?Tdap vaccine given in AP or PP setting: received AP ?Flu vaccine given in AP or PP setting: received AP ? ?Contraception: declines ? ?Prenatal Labs:  ?[facility] Prenatal Labs  ?Dating   Blood type: O/Positive/-- (11/15 1455)   ?Genetic Screen NIPS: Antibody:Negative (11/15 1455)  ?Anatomic [place]   Rubella: 8.19 (11/15 1455)  ?Varicella: immune  ?GTT Early:               Third trimester: 113 RPR: Non Reactive (11/15 1455)   ?Rhogam  n/a HBsAg: Negative (11/15 1455)   ?TDaP vaccine         05/17/21              Flu Shot: 11/2020 HIV: Non Reactive (11/15 1455)   ?Baby Food                                GBS: Neg  ?Contraception   Pap: up to date, normal  ?CBB       ?CS/VBAC      ?Support Person Husband: Tanya Glenn    ? ? ? ?Plan:  ?Tanya Glenn was discharged to home in good condition. ?Follow-up appointment with delivering provider in 2 weeks. ? ?Discharge Medications: ?Allergies as of 06/27/2021   ? ?   Reactions  ? Sulfa Antibiotics Hives  ? ?  ? ?  ?Medication List  ?  ? ?STOP taking these medications   ? ?aspirin 81 MG chewable tablet ?  ?betamethasone acetate-betamethasone sodium phosphate 6 (3-3) MG/ML injection ?Commonly known as: CELESTONE ?  ?lactated ringers infusion ?  ?magnesium sulfate 40 grams in SWI 1000 mL 40 GM/1000ML Soln ?  ?NIFEdipine 10 MG capsule ?Commonly known as: PROCARDIA ?  ?promethazine 25 MG tablet ?Commonly known as: PHENERGAN ?  ? ?  ? ?TAKE these medications   ? ?acetaminophen 325 MG tablet ?Commonly known as: Tylenol ?Take 2 tablets (650 mg total) by mouth every 4 (four) hours as needed (for pain scale < 4). ?  ?albuterol 108 (90 Base) MCG/ACT inhaler ?Commonly known as: VENTOLIN HFA ?INHALE 1 PUFF BY MOUTH EVERY 4 HOURS AS  NEEDED FOR WHEEZING OR SHORTNESS OF BREATH. ?  ?benzocaine-Menthol 20-0.5 % Aero ?Commonly known as: DERMOPLAST ?Apply 1 application. topically as needed for irritation (perineal discomfort). ?  ?dibucaine 1 % Oint ?Commonly known as: NUPERCAINAL ?Place 1 application. rectally as needed for hemorrhoids. ?  ?ibuprofen 600 MG tablet ?Commonly known as: ADVIL ?Take 1 tablet (600 mg total) by mouth every 6 (six) hours. ?  ?levothyroxine 25 MCG tablet ?Commonly known as: SYNTHROID ?Take 1 tablet (25 mcg total) by mouth daily before breakfast. ?Start taking on: Jun 28, 2021 ?What changed:  ?medication strength ?how much to take ?  ?omega-3 acid ethyl esters 1 g capsule ?Commonly known as: LOVAZA ?Take by mouth 2 (two) times daily. ?  ?PRENATAL 1 PO ?Take by mouth. ?  ?Qvar RediHaler  40 MCG/ACT inhaler ?Generic drug: beclomethasone ?INHALE 2 PUFFS INTO THE LUNGS TWO TIMES DAILY ?  ?senna-docusate 8.6-50 MG tablet ?Commonly known as: Senokot-S ?Take 2 tablets by mouth daily. ?Start taking on: Jun 28, 2021 ?  ?simethicone 80 MG chewable tablet ?Commonly known as: MYLICON ?Chew 1 tablet (80 mg total) by mouth as needed for flatulence. ?  ?witch hazel-glycerin pad ?Commonly known as: TUCKS ?Apply 1 application. topically as needed for hemorrhoids. ?  ? ?  ? ? ? Follow-up Information   ? ? Will Bonnet, MD. Schedule an appointment as soon as possible for a visit in 2 week(s).   ?Specialty: Obstetrics and Gynecology ?Why: 2 week mood check and 6wk Postpartum visit ?Contact information: ?[address] ?[place] [address] ?[phone] ? ? ?  ?  ? ?  ?  ? ?  ? ? ?Signed: ? ?Gertie Fey, CNM ?06/27/2021  ?12:01 PM ? ? ? ?

## 2021-06-26 NOTE — Progress Notes (Signed)
Labor Check ? ?Subj:  ?Complaints: comfortable with epidural  ? ?Obj:  ?BP 104/64   Pulse 83   Temp 98.2 ?F (36.8 ?C) (Oral)   Resp 16   Ht 5\' 6"  (1.676 m)   Wt 83.5 kg   LMP 10/07/2020 (Exact Date)   SpO2 98%   BMI 29.70 kg/m?  ?   ?Cervix: Dilation: 7.5 / Effacement (%): 70 / Station: -1, 0  ? AROM: clear fluid ?Baseline FHR: 135 beats/min   Variability: moderate   Accelerations: present   Decelerations: absent ?Contractions: present frequency: 1-2 q 10 min ?Overall assessment: cat 1 ? ?Female chaperone present for pelvic exam:  ? ?A/P: 36 y.o. G2P0100 female at [redacted]w[redacted]d with normal labor, now stalled.  ?1.  Labor: AROM - recheck 1 hour, if no progress, add pitocin  ?2.  FWB: reassuring, Overall assessment: category 1  ?3.  GBS negative  ?4.  Pain: epidural ?5.  Recheck: 1 hour, as above  ? ?[redacted]w[redacted]d, MD, FACOG ?[facility] ?06/26/2021 4:45 AM   ?

## 2021-06-26 NOTE — Lactation Note (Signed)
This note was copied from a baby's chart. ?Lactation Consultation Note ? ?Patient Name: Tanya Glenn ?Today's Date: 06/26/2021 ?Reason for consult: Initial assessment;Primapara;Early term 37-38.6wks;RN request ?Age:37 hours ? ?Maternal Data ? ?Mom delivered vaginally baby Tanya at 42 5/7 weeks. Baby has latched and breastfeed twice since birth. ?Mom with history of: IUFD at 73 weeks, AMA, IVF pregnancy,Hypothyroidism , and narcolepsy. ? ?Does the patient have breastfeeding experience prior to this delivery?: No ? ?Feeding ?Mother's Current Feeding Choice: Breast Milk ? ?LATCH Score ?Latch: Grasps breast easily, tongue down, lips flanged, rhythmical sucking. (baby initially latching at left breast in cradle hold. Provided recommendations for cross cradle hold to support breast to achieve deeper latch. Mom reports baby did latch in the football hold, mom changed positions baby latched well at the right breast.) ? ?Audible Swallowing: Spontaneous and intermittent ? ?Type of Nipple: Everted at rest and after stimulation ? ?Comfort (Breast/Nipple): Soft / non-tender ? ?Hold (Positioning): Assistance needed to correctly position infant at breast and maintain latch. ? ?LATCH Score: 9 ?  ? ?Interventions ?Interventions: Breast feeding basics reviewed;Assisted with latch;Breast compression;Adjust position;Support pillows;Position options;Education ? ?Discharge ?Discharge Education: Other (comment) (Mom reports she will take baby for follow-up at [facility]. Provided information that outpatient [place] available at that practice.) ?Pump: Personal (Mom has a Spectra pump, and a hands fee Willow pump. Discussed with mom difference in pumps.) Mom had questions concerning when to begin pumping as she wants to have a storage of milk incase she needs to start taking her narcolepsy medication. Discussed a few options: once her milk transitions in to optimize those times baby does not take the 2nd breast, post pumping a few  times a day to comfort, and looking for windows of opportunity once the baby regains birth weight and takes a 4-5 hour stretch of sleep she could pump midway of that time frame. ? ?Consult Status ?Consult Status: Follow-up ?Date: 06/27/21 ?Follow-up type: In-patient ? ?Update provided to care nurse. ? ?Jonna Ernesteen Mihalic ?06/26/2021, 3:56 PM ? ? ? ?

## 2021-06-26 NOTE — Anesthesia Preprocedure Evaluation (Signed)
Anesthesia Evaluation  ?Patient identified by MRN, date of birth, ID band ?Patient awake ? ? ? ?Reviewed: ?Allergy & Precautions, H&P , NPO status , Patient's Chart, lab work & pertinent test results ? ?History of Anesthesia Complications ?Negative for: history of anesthetic complications ? ?Airway ?Mallampati: II ? ?TM Distance: >3 FB ? ? ? ? Dental ?no notable dental hx. ? ?  ?Pulmonary ?neg shortness of breath, asthma , neg recent URI,  ?  ? ? ? ? ? ? ? Cardiovascular ?Exercise Tolerance: Good ?(-) hypertensionnegative cardio ROS ? ? ? ? ?  ?Neuro/Psych ?PSYCHIATRIC DISORDERS Anxiety Depression   ? GI/Hepatic ?GERD  ,  ?Endo/Other  ? ? Renal/GU ?  ?negative genitourinary ?  ?Musculoskeletal ? ? Abdominal ?  ?Peds ? Hematology ?negative hematology ROS ?(+)   ?Anesthesia Other Findings ?IUFD ? ?Past Medical History: ?No date: Asthma ?No date: Asthma ?No date: Narcolepsy ?No date: Symptomatic PVCs ? ?Past Surgical History: ?No date: WISDOM TOOTH EXTRACTION ? ?BMI   ? Body Mass Index: 27.50 kg/m?  ?  ? ? Reproductive/Obstetrics ?(+) Pregnancy ? ?  ? ? ? ? ? ? ? ? ? ? ? ? ? ?  ?  ? ? ? ? ? ? ? ? ?Anesthesia Physical ? ?Anesthesia Plan ? ?ASA: 2 ? ?Anesthesia Plan: Epidural  ? ?Post-op Pain Management:   ? ?Induction:  ? ?PONV Risk Score and Plan:  ? ?Airway Management Planned:  ? ?Additional Equipment:  ? ?Intra-op Plan:  ? ?Post-operative Plan:  ? ?Informed Consent: I have reviewed the patients History and Physical, chart, labs and discussed the procedure including the risks, benefits and alternatives for the proposed anesthesia with the patient or authorized representative who has indicated his/her understanding and acceptance.  ? ? ? ? ? ?Plan Discussed with: Anesthesiologist ? ?Anesthesia Plan Comments:   ? ? ? ? ? ? ?Anesthesia Quick Evaluation ? ?

## 2021-06-27 ENCOUNTER — Other Ambulatory Visit: Payer: Self-pay

## 2021-06-27 LAB — CBC
HCT: 27.9 % — ABNORMAL LOW (ref 36.0–46.0)
Hemoglobin: 9.4 g/dL — ABNORMAL LOW (ref 12.0–15.0)
MCH: 31.9 pg (ref 26.0–34.0)
MCHC: 33.7 g/dL (ref 30.0–36.0)
MCV: 94.6 fL (ref 80.0–100.0)
Platelets: 101 10*3/uL — ABNORMAL LOW (ref 150–400)
RBC: 2.95 MIL/uL — ABNORMAL LOW (ref 3.87–5.11)
RDW: 12.9 % (ref 11.5–15.5)
WBC: 8.6 10*3/uL (ref 4.0–10.5)
nRBC: 0 % (ref 0.0–0.2)

## 2021-06-27 MED ORDER — LEVOTHYROXINE SODIUM 25 MCG PO TABS
25.0000 ug | ORAL_TABLET | Freq: Every day | ORAL | 11 refills | Status: DC
  Filled 2021-06-27: qty 30, 30d supply, fill #0

## 2021-06-27 MED ORDER — BENZOCAINE-MENTHOL 20-0.5 % EX AERO: 1.0000 "application " | INHALATION_SPRAY | CUTANEOUS | Status: DC | PRN

## 2021-06-27 MED ORDER — SENNOSIDES-DOCUSATE SODIUM 8.6-50 MG PO TABS: 2.0000 | ORAL_TABLET | Freq: Every day | ORAL | Status: DC

## 2021-06-27 MED ORDER — ACETAMINOPHEN 325 MG PO TABS: 650.0000 mg | ORAL_TABLET | ORAL | Status: DC | PRN

## 2021-06-27 MED ORDER — DIBUCAINE (PERIANAL) 1 % EX OINT: 1.0000 "application " | TOPICAL_OINTMENT | CUTANEOUS | Status: DC | PRN

## 2021-06-27 MED ORDER — SIMETHICONE 80 MG PO CHEW: 80.0000 mg | CHEWABLE_TABLET | ORAL | 0 refills | Status: DC | PRN

## 2021-06-27 MED ORDER — WITCH HAZEL-GLYCERIN EX PADS: 1.0000 "application " | MEDICATED_PAD | CUTANEOUS | 12 refills | Status: DC | PRN

## 2021-06-27 MED ORDER — IBUPROFEN 600 MG PO TABS: 600.0000 mg | ORAL_TABLET | Freq: Four times a day (QID) | ORAL | 0 refills | Status: DC

## 2021-06-27 NOTE — Progress Notes (Signed)
Post Partum Day 1 ?Subjective: ?Doing well, no complaints.  Tolerating regular diet, pain with PO meds, voiding and ambulating without difficulty. ? ?No CP SOB Fever,Chills, N/V or leg pain; denies nipple or breast pain, no HA change of vision, RUQ/epigastric pain ? ?Objective: ?BP 100/70 (BP Location: Left Arm)   Pulse 88   Temp 98.1 ?F (36.7 ?C) (Oral)   Resp 18   Ht 5\' 6"  (1.676 m)   Wt 83.5 kg   LMP 10/07/2020 (Exact Date)   SpO2 100%   Breastfeeding Unknown   BMI 29.70 kg/m?  ?  ?Physical Exam:  ?General: NAD ?Breasts: soft/nontender ?CV: RRR ?Pulm: nl effort, CTABL ?Abdomen: soft, NT, BS x 4 ?Perineum: minimal edema, intact/repair well approximated ?Lochia: moderate ?Uterine Fundus: fundus firm and 1 fb below umbilicus ?DVT Evaluation: no cords, ttp LEs  ? ?Recent Labs  ?  06/25/21 ?2303 06/27/21 ?08/27/21  ?HGB 11.4* 9.4*  ?HCT 33.1* 27.9*  ?WBC 8.6 8.6  ?PLT 101* 101*  ? ? ?Assessment/Plan: ?36 y.o. G2P1101 postpartum day # 1 ? ?- Continue routine PP care ?- Lactation consult ?- Discussed contraceptive options including implant, IUDs hormonal and non-hormonal, injection, pills/ring/patch, condoms, and NFP.  ?- Acute blood loss anemia - hemodynamically stable and asymptomatic; start po ferrous sulfate BID with stool softeners  ?- Immunization status: all Imms up to date ? ?Disposition: Does not desire Dc home today.  ? ? ?30, CNM ?06/27/2021 ?8:40 AM ? ? ? ? ?

## 2021-06-27 NOTE — Anesthesia Postprocedure Evaluation (Signed)
Anesthesia Post Note ? ?Patient: Lakea Mittelman ? ?Procedure(s) Performed: AN AD HOC LABOR EPIDURAL ? ?Patient location during evaluation: Mother Baby ?Anesthesia Type: Epidural ?Level of consciousness: awake, awake and alert and oriented ?Pain management: pain level controlled ?Vital Signs Assessment: post-procedure vital signs reviewed and stable ?Respiratory status: spontaneous breathing, nonlabored ventilation and respiratory function stable ?Cardiovascular status: blood pressure returned to baseline and stable ?Postop Assessment: no headache and no backache ?Anesthetic complications: no ? ? ?No notable events documented. ? ? ?Last Vitals:  ?Vitals:  ? 06/26/21 2242 06/27/21 0415  ?BP: 124/82 110/69  ?Pulse: 85 84  ?Resp: 18 18  ?Temp: 37 ?C 37 ?C  ?SpO2: 98% 99%  ?  ?Last Pain:  ?Vitals:  ? 06/27/21 0609  ?TempSrc:   ?PainSc: 0-No pain  ? ? ?  ?  ?  ?  ?  ?  ? ?Judeth Cornfield Ridhi Hoffert ? ? ? ? ?

## 2021-06-27 NOTE — Lactation Note (Signed)
This note was copied from a baby's chart. ?Lactation Consultation Note ? ?Patient Name: Tanya Glenn ?Today's Date: 06/27/2021 ?Reason for consult: Follow-up assessment;Primapara;Early term 37-38.6wks;RN request;Other (Comment) (potential 24hr discharge) ?Age:37 hours ? ?Lactation called in by mom to ask for formula. This had been previously discussed as a possible path that mom wanted to take.  ?We reviewed infants stomach size, holding baby upright, pace bottle feeding, formula preparation with powder once home, cleaning of bottles. ?Reviewed importance of breast stimulation if baby is offered a bottle instead of breast. Mom verbalized understanding. ? ?Maternal Data ?Has patient been taught Hand Expression?: Yes ?Does the patient have breastfeeding experience prior to this delivery?: No ? ?Feeding ?Mother's Current Feeding Choice: Breast Milk ?Nipple Type: Slow - flow ? ?LATCH Score ?Latch: Grasps breast easily, tongue down, lips flanged, rhythmical sucking. ? ?Audible Swallowing: Spontaneous and intermittent ? ?Type of Nipple: Everted at rest and after stimulation ? ?Comfort (Breast/Nipple): Soft / non-tender ? ?Hold (Positioning): No assistance needed to correctly position infant at breast. (pulled down on chin to deepen latch) ? ?LATCH Score: 10 ? ? ?Lactation Tools Discussed/Used ?  ? ?Interventions ?Interventions: Pace feeding (bottle use, breast stimulation w/ bottle feeding) ? ?Discharge ?Discharge Education: Engorgement and breast care;Warning signs for feeding baby;Outpatient recommendation ?Pump: Hands Free;Personal [facility] (for work); [facility] (home)) ? ?Consult Status ?Consult Status: Complete ?Date: 06/27/21 ?Follow-up type: Call as needed ? ? ? ?Danford Bad ?06/27/2021, 12:29 PM ? ? ? ?

## 2021-06-27 NOTE — Progress Notes (Signed)
Discharge instructions given and reviewed with pt and family. Pt educated on follow up care, appointments and when to notify provider, questions invited and answered. ID bands matched with infant.  Pt and family noted understanding  to teaching/education.Pt escorted off unit by volunteer in wheelchair with infant safely in arms. Infant secured in car seat by family. 

## 2021-06-27 NOTE — Lactation Note (Signed)
This note was copied from a baby's chart. ?Lactation Consultation Note ? ?Patient Name: Tanya Glenn ?Today's Date: 06/27/2021 ?Reason for consult: Follow-up assessment;Primapara;Early term 37-38.6wks;RN request;Other (Comment) (potential 24hr discharge) ?Age:36 years ? ?Maternal Data ?Has patient been taught Hand Expression?: Yes ?Does the patient have breastfeeding experience prior to this delivery?: No ? ?Feeding ?Mother's Current Feeding Choice: Breast Milk ? ?Baby active at the breast; mom notes discomfort. LC observes baby to have shallow latch (corner of mouth narrow), with chomping motion. ? ?LATCH Score ?Latch: Grasps breast easily, tongue down, lips flanged, rhythmical sucking. ? ?Audible Swallowing: Spontaneous and intermittent ? ?Type of Nipple: Everted at rest and after stimulation ? ?Comfort (Breast/Nipple): Soft / non-tender ? ?Hold (Positioning): No assistance needed to correctly position infant at breast. (pulled down on chin to deepen latch) ? ?LATCH Score: 10 ? ?LC assisted with removal of baby at breast- LC notes pinched nipple. Assisted with re-latch by pulling down on chin and helping to pull baby in tight. Showed parents difference in side of mouth. ? ?Lactation Tools Discussed/Used ?  ? ?Interventions ?Interventions: Breast feeding basics reviewed ? ?Reviewed typical feeding patterns for baby, 37week feeding challenges, importance of keeping baby awake at breast for transfer, output expectations for DOL/HOL and going forward. Tips for positioning/latch, benefits of comfort gels and coconut oil to reduce the discomfort of nipple soreness.  ? ?Discharge ?Discharge Education: Engorgement and breast care;Warning signs for feeding baby;Outpatient recommendation ?Pump: Hands Free;Personal [facility] (for work); [facility] (home)) ? ?Parents considering a different feeding plan for overnight. Mom is struggling with sleep deprivation and has dx of narcolepsy. Her and dad have been talking about bottle  feeding overnight while mom pumps; starting with formula and transitioning to breastmilk when mom's supply increases. ?We discussed importance of staying stimulated 8-12x/24 hours, even overnight. Parents are looking to minimize time awake at night if possible. Guidance and support given to help them meet their needs. ? ?Consult Status ?Consult Status: Complete ?Date: 06/27/21 ?Follow-up type: Call as needed ? ? ? ?Lavonia Drafts ?06/27/2021, 9:25 AM ? ? ? ?

## 2021-07-10 ENCOUNTER — Other Ambulatory Visit: Payer: Self-pay

## 2021-07-12 ENCOUNTER — Inpatient Hospital Stay: Admit: 2021-07-12 | Payer: Self-pay

## 2021-07-24 DIAGNOSIS — H5203 Hypermetropia, bilateral: Secondary | ICD-10-CM | POA: Diagnosis not present

## 2021-07-24 DIAGNOSIS — H52223 Regular astigmatism, bilateral: Secondary | ICD-10-CM | POA: Diagnosis not present

## 2021-08-23 DIAGNOSIS — E079 Disorder of thyroid, unspecified: Secondary | ICD-10-CM | POA: Diagnosis not present

## 2021-08-23 DIAGNOSIS — O99283 Endocrine, nutritional and metabolic diseases complicating pregnancy, third trimester: Secondary | ICD-10-CM | POA: Diagnosis not present

## 2021-08-30 ENCOUNTER — Other Ambulatory Visit: Payer: Self-pay | Admitting: Obstetrics and Gynecology

## 2021-08-30 DIAGNOSIS — E063 Autoimmune thyroiditis: Secondary | ICD-10-CM | POA: Diagnosis not present

## 2021-08-30 DIAGNOSIS — R102 Pelvic and perineal pain: Secondary | ICD-10-CM

## 2021-08-30 DIAGNOSIS — E038 Other specified hypothyroidism: Secondary | ICD-10-CM | POA: Diagnosis not present

## 2021-09-01 ENCOUNTER — Ambulatory Visit
Admission: RE | Admit: 2021-09-01 | Discharge: 2021-09-01 | Disposition: A | Discharge status: Home / Self Care | Payer: 59 | Source: Ambulatory Visit | Attending: Obstetrics and Gynecology | Admitting: Obstetrics and Gynecology

## 2021-09-01 DIAGNOSIS — R102 Pelvic and perineal pain: Secondary | ICD-10-CM | POA: Insufficient documentation

## 2021-09-01 IMAGING — US US THYROID
1 series · 14 of 23 positions shown · non-contrast
Comparison: None.

CLINICAL DATA: Postpartum thyroiditis

EXAM:
THYROID ULTRASOUND
TECHNIQUE: Ultrasound examination of the thyroid gland and adjacent soft
tissues was performed.

[Series 1: us thyroid · 0.07mm/px · 14 of 23 slices shown]
[im 1/23]
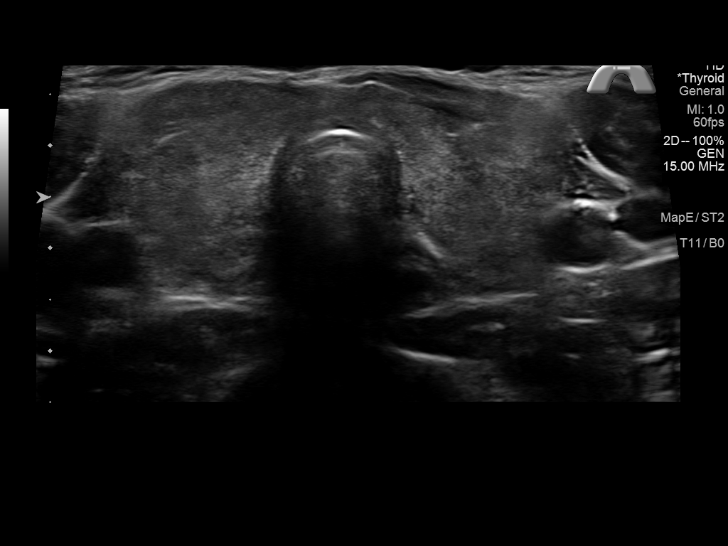
[im 3/23]
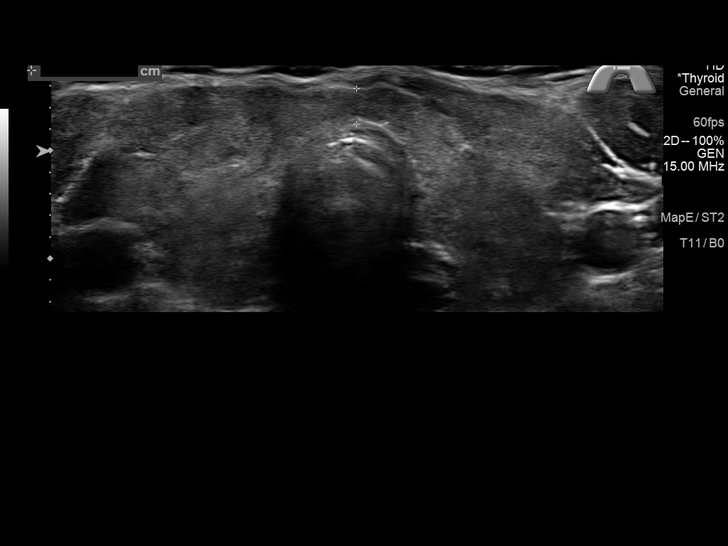
[im 5/23]
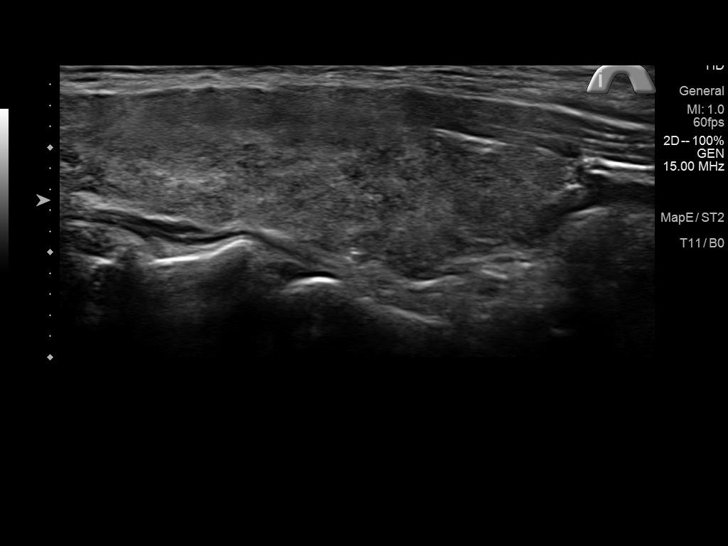
[im 6/23]
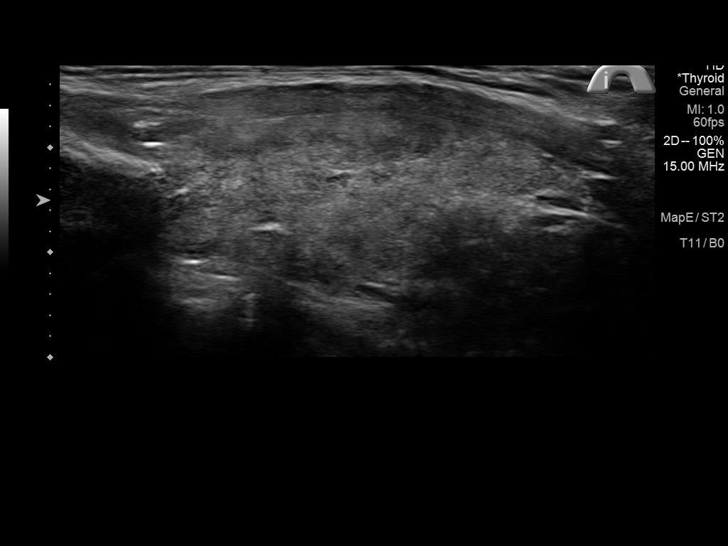
[im 8/23]
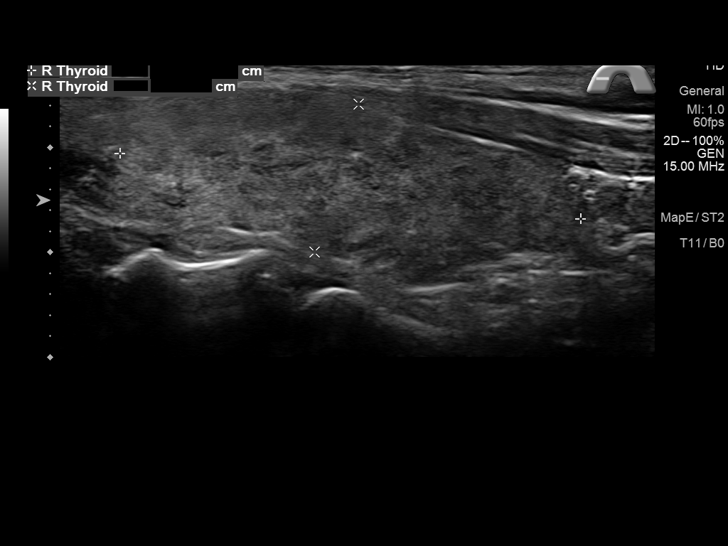
[im 10/23]
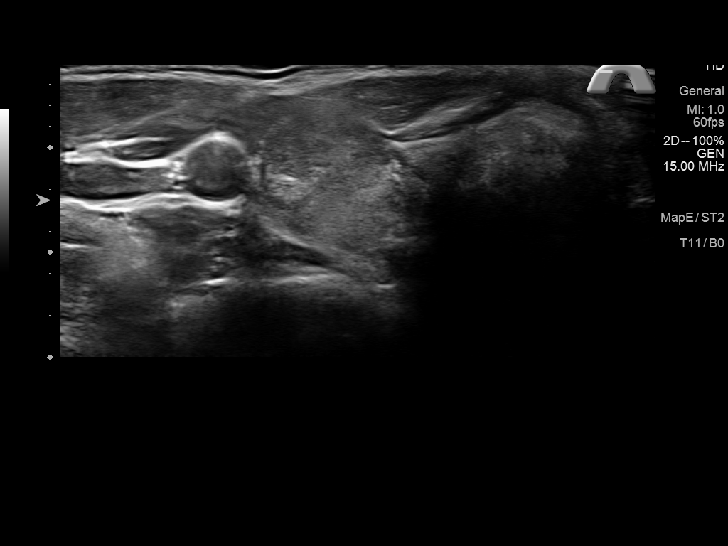
[im 11/23]
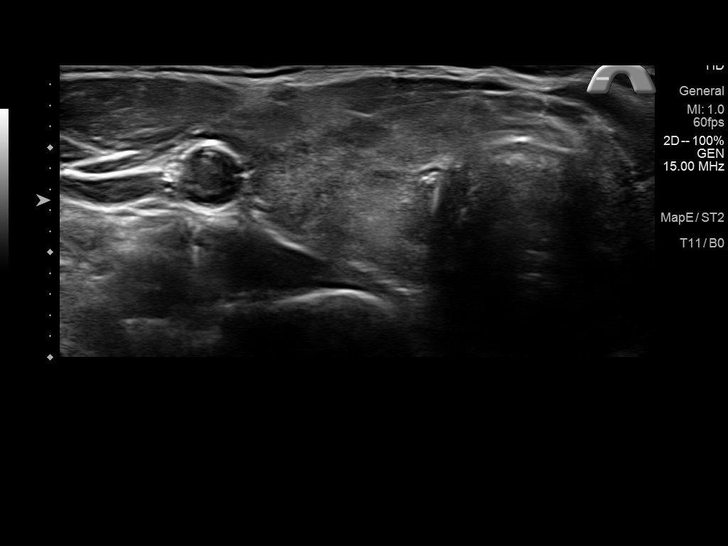
[im 13/23]
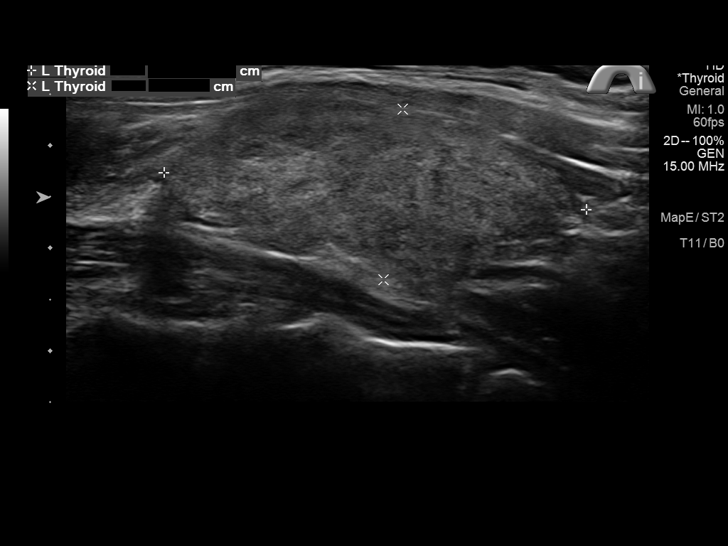
[im 14/23]
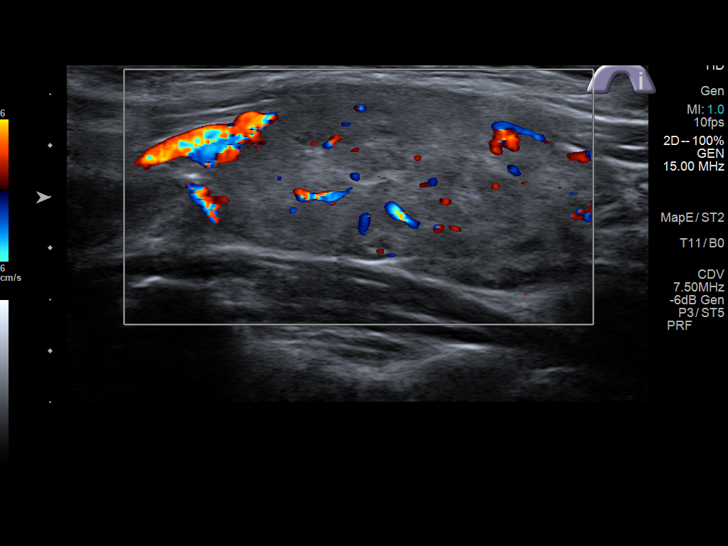
[im 16/23]
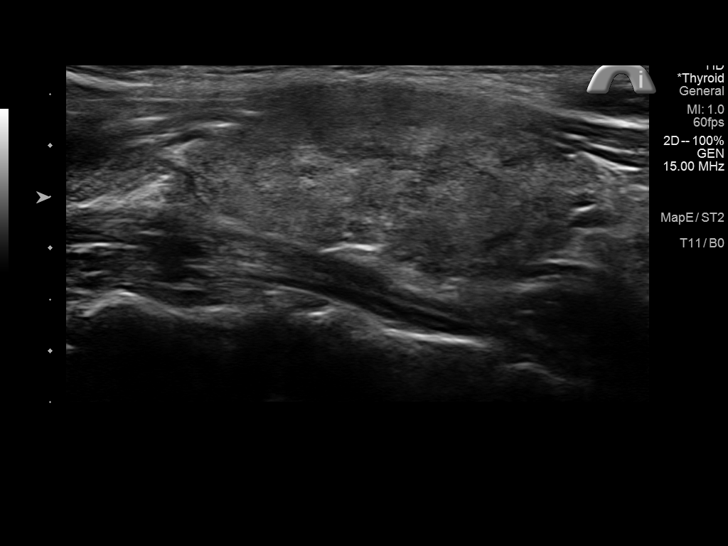
[im 18/23]
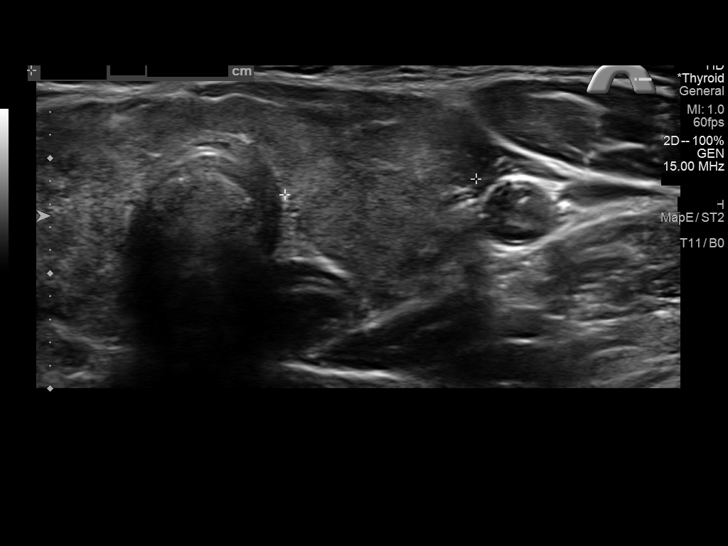
[im 19/23]
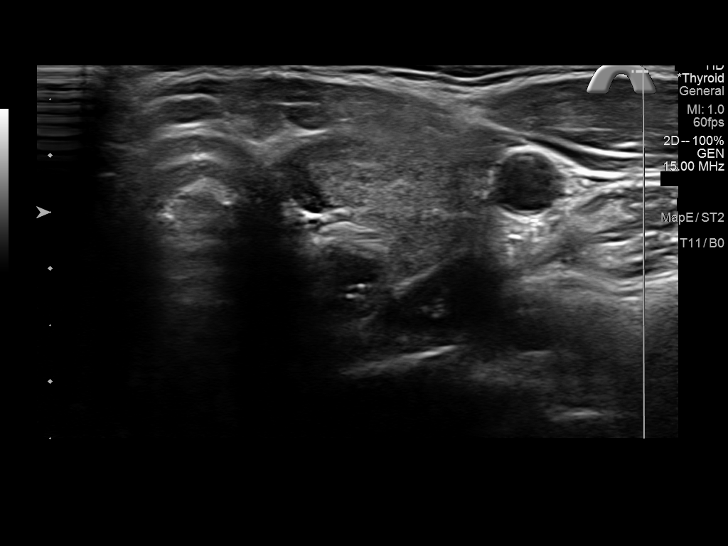
[im 21/23]
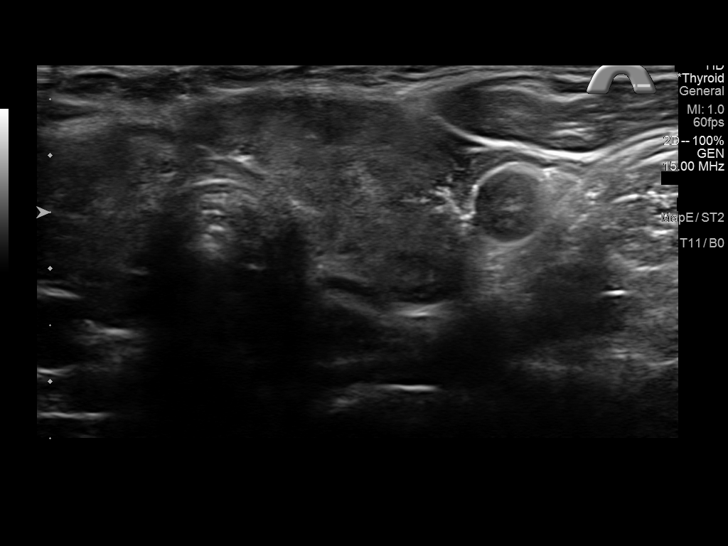
[im 23/23]
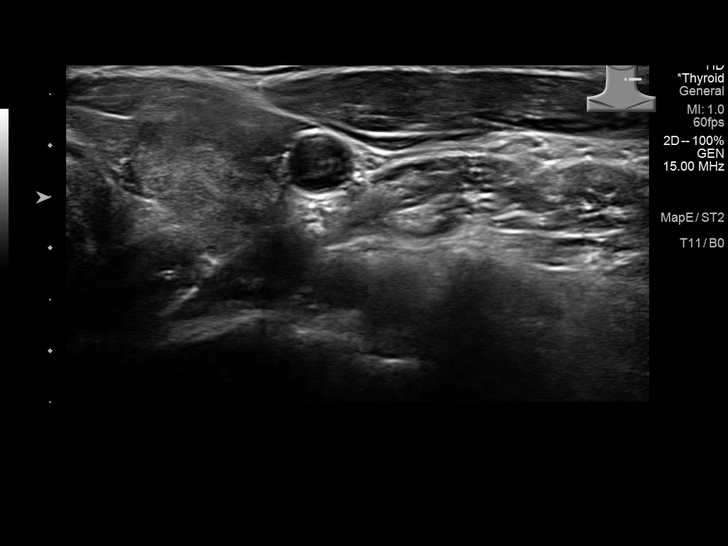

[14 of 23 positions shown; findings below may reference images not displayed]

FINDINGS: Parenchymal Echotexture: Moderately heterogenous

Isthmus: 0.3 cm thickness

Right lobe: 4.4 x 1.5 x 1.6 cm

Left lobe: 4.1 x 1.7 x 1.7 cm

_________________________________________________________

Estimated total number of nodules >/= 1 cm: 0

Number of spongiform nodules >/=  2 cm not described below (TR1): 0

Number of mixed cystic and solid nodules >/= 1.5 cm not described
below (TR2): 0

_________________________________________________________

No discrete nodules are seen within the thyroid gland. No regional
cervical adenopathy identified.
IMPRESSION: Normal-sized thyroid with heterogenous parenchyma.

No nodule or other indication for biopsy or imaging follow-up.

The above is in keeping with the ACR TI-RADS recommendations - [HOSPITAL] 9709;[DATE].

## 2021-09-22 DIAGNOSIS — E063 Autoimmune thyroiditis: Secondary | ICD-10-CM | POA: Diagnosis not present

## 2021-09-22 DIAGNOSIS — E038 Other specified hypothyroidism: Secondary | ICD-10-CM | POA: Diagnosis not present

## 2021-10-04 DIAGNOSIS — G47411 Narcolepsy with cataplexy: Secondary | ICD-10-CM | POA: Diagnosis not present

## 2021-10-31 DIAGNOSIS — F4320 Adjustment disorder, unspecified: Secondary | ICD-10-CM | POA: Diagnosis not present

## 2021-11-06 DIAGNOSIS — F4320 Adjustment disorder, unspecified: Secondary | ICD-10-CM | POA: Diagnosis not present

## 2021-11-08 ENCOUNTER — Ambulatory Visit (INDEPENDENT_AMBULATORY_CARE_PROVIDER_SITE_OTHER): Payer: 59 | Admitting: Family Medicine

## 2021-11-08 ENCOUNTER — Other Ambulatory Visit: Payer: Self-pay

## 2021-11-08 ENCOUNTER — Encounter: Payer: Self-pay | Admitting: Family Medicine

## 2021-11-08 VITALS — BP 90/60 | HR 52 | Temp 98.0°F | Ht 66.0 in | Wt 145.0 lb

## 2021-11-08 DIAGNOSIS — L409 Psoriasis, unspecified: Secondary | ICD-10-CM

## 2021-11-08 DIAGNOSIS — Z0001 Encounter for general adult medical examination with abnormal findings: Secondary | ICD-10-CM | POA: Diagnosis not present

## 2021-11-08 DIAGNOSIS — K582 Mixed irritable bowel syndrome: Secondary | ICD-10-CM

## 2021-11-08 DIAGNOSIS — Z130 Encounter for screening for diseases of the blood and blood-forming organs and certain disorders involving the immune mechanism: Secondary | ICD-10-CM | POA: Diagnosis not present

## 2021-11-08 DIAGNOSIS — Z13220 Encounter for screening for lipoid disorders: Secondary | ICD-10-CM | POA: Diagnosis not present

## 2021-11-08 DIAGNOSIS — K589 Irritable bowel syndrome without diarrhea: Secondary | ICD-10-CM | POA: Insufficient documentation

## 2021-11-08 MED ORDER — CLOBETASOL PROPIONATE 0.05 % EX SHAM
MEDICATED_SHAMPOO | CUTANEOUS | 0 refills | Status: DC
  Filled 2021-11-08: qty 118, 28d supply, fill #0

## 2021-11-08 NOTE — Assessment & Plan Note (Signed)
Patient with persistent scalp psoriasis symptoms.  Has responded well to clobetasol shampoo in the past.  This was refilled for her.  Breast-feeding considerations reviewed and up-to-date noting that topical steroids are generally safe in breast-feeding.  She is advised to not get this topical steroid on her breasts.

## 2021-11-08 NOTE — Progress Notes (Signed)
Tommi Rumps, MD Phone: [phone]  Tanya Glenn is a 36 y.o. female who presents today for CPE.  Diet: does not eat terribly, no fast food, gets plenty of fruits and vegetables, no soda or sweet tea Exercise: was playing kickball, is going to start exercising more Pap smear: notes she had one last year, GYN is following Family history-  Colon cancer: no  Breast cancer: mother  Ovarian cancer: no Menses: spotting recently, she is currently breast feeding and menses has not fully returned after being pregnant Vaccines-   Flu: due, defers to work  Tetanus: UTD  COVID19: declines further vaccines HIV screening: UTD  Hep C Screening: UTD Tobacco use: no Alcohol use: rare Illicit Drug use: no Dentist: yes Ophthalmology: yes  Psoriasis of the scalp: Patient notes she did not have any issues with this while she was pregnant though since delivering she has had more issues.  She would like a refill on her clobetasol shampoo.  IBS: Patient thinks she may be has IBS.  This has been going on intermittently for quite some time.  She has had intermittent issues with increased gas and mucus with her stools.  She notes her stools will be loose when this occurs.  She notes in the past she has alternated between constipation and loose stools.  She does not have to strain to have bowel movements.  She has no blood in her stool.  She does note some abdominal cramping prior to having bowel movements at times.  No vomiting.  Some nausea.  No penciling of her stools though she does note at the end of a bowel movement they are sometimes narrower than the initial portion of the bowel movement. Patient questioned whether these symptoms could be colon cancer related.  Patient notes loose stool symptoms worsen when she has lots of stress.   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   Hypothyroid in pregnancy, antepartum 12/19/2020   Family history of breast cancer 12/21/2020   Family history of gene mutation 47/65/4650   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   Nausea & vomiting 03/15/2021   Disorder of thyroid, unspecified 08/30/2020   IBS (irritable bowel syndrome) 11/08/2021   Resolved Ambulatory Problems    Diagnosis Date Noted   Leg pain, right 09/26/2015   Anxiety and depression 02/05/2012   Palpitations 10/08/2016   Hypokalemia 10/08/2016   Lipid screening 04/29/2018   Pre-conception counseling 04/29/2018   Asthma exacerbation 05/23/2018   Vaginal candidiasis 10/31/2018   Dizziness 09/01/2019   Supervision of high risk pregnancy, antepartum 01/04/2020   Encounter for supervision of pregnancy resulting from assisted reproductive technology 01/04/2020   Antepartum multigravida of advanced maternal age 79/15/2021   Abdominal pain during pregnancy in third trimester 04/30/2020   Decreased fetal movement 05/02/2020   Fetal demise, greater than 22 weeks, antepartum 05/02/2020   [redacted] weeks gestation of pregnancy 05/04/2020   Prior pregnancy with fetal demise 12/19/2020   Genetic testing 01/04/2021   Second trimester bleeding 02/14/2021   Preterm uterine contractions in third  trimester, antepartum 04/23/2021   Normal labor 06/25/2021   COVID-19 08/30/2020   Elevated blood pressure reading 04/25/2021   History of IUFD 01/17/2021   History of stillbirth in currently pregnant patient 01/02/2021   Pregnancy resulting from in vitro fertilization in second trimester 02/14/2021    Pregnancy resulting from in vitro fertilization, antepartum 01/17/2021   Threatened preterm labor, second trimester 04/27/2021   Past Medical History:  Diagnosis Date   Anxiety    Asthma    Asthma    Depression    Symptomatic PVCs     Family History  Problem Relation Age of Onset   Breast cancer Mother 7       triple negative, BRIP1+   Hyperlipidemia Father    Skin cancer Maternal Aunt    Heart disease Maternal Grandmother    Stroke Maternal Grandmother     Social History   Socioeconomic History   Marital status: Married    Spouse name: Gwenyth Bouillon   Number of children: Not on file   Years of education: Not on file   Highest education level: Not on file  Occupational History   Occupation: Pharmacist  Tobacco Use   Smoking status: Never   Smokeless tobacco: Never  Vaping Use   Vaping Use: Never used  Substance and Sexual Activity   Alcohol use: Yes    Comment: occasional drink on the weekend.   Drug use: No   Sexual activity: Yes    Birth control/protection: None  Other Topics Concern   Not on file  Social History Narrative   Lives in [place].  Exercises - wt training - 4+ x /wk.     Social Determinants of Health   Financial Resource Strain: Not on file  Food Insecurity: Not on file  Transportation Needs: Not on file  Physical Activity: Not on file  Stress: Not on file  Social Connections: Not on file  Intimate Partner Violence: Not on file    ROS  General:  Negative for nexplained weight loss, fever Skin: Negative for new or changing mole, sore that won't heal HEENT: Negative for trouble hearing, trouble seeing, ringing in ears, mouth sores, hoarseness, change in voice, dysphagia. CV:  Negative for chest pain, dyspnea, edema, palpitations Resp: Negative for cough, dyspnea, hemoptysis GI: Positive for nausea, loose stools, negative for vomiting, diarrhea, constipation, abdominal pain, melena, hematochezia. GU: Negative for dysuria, incontinence,  urinary hesitance, hematuria, vaginal or penile discharge, polyuria, sexual difficulty, lumps in testicle or breasts MSK: Negative for muscle cramps or aches, joint pain or swelling Neuro: Negative for headaches, weakness, numbness, dizziness, passing out/fainting Psych: Negative for depression, anxiety, memory problems  Objective  Physical Exam Vitals:   11/08/21 1542  BP: 90/60  Pulse: (!) 52  Temp: 98 F (36.7 C)  SpO2: 99%    BP Readings from Last 3 Encounters:  11/08/21 90/60  06/27/21 100/70  04/23/21 128/72   Wt Readings from Last 3 Encounters:  11/08/21 145 lb (65.8 kg)  06/25/21 184 lb (83.5 kg)  04/23/21 179 lb (81.2 kg)    Physical Exam Constitutional:      General: She is not in acute distress.    Appearance: She is not diaphoretic.  HENT:     Mouth/Throat:     Mouth: Mucous membranes are moist.     Pharynx: Oropharynx is clear.  Eyes:     Pupils: Pupils are equal, round, and reactive to light.  Cardiovascular:     Rate and Rhythm: Normal rate and  regular rhythm.     Heart sounds: Normal heart sounds.  Pulmonary:     Effort: Pulmonary effort is normal.     Breath sounds: Normal breath sounds.  Abdominal:     General: Bowel sounds are normal. There is no distension.     Palpations: Abdomen is soft.     Tenderness: There is no abdominal tenderness.  Musculoskeletal:     Right lower leg: No edema.     Left lower leg: No edema.  Skin:    General: Skin is warm and dry.  Neurological:     Mental Status: She is alert.  Psychiatric:        Mood and Affect: Mood normal.      Assessment/Plan:   Problem List Items Addressed This Visit     Encounter for general adult medical examination with abnormal findings - Primary    Physical exam completed.  I encouraged healthy diet and exercise.  She will continue to follow with GYN for her Pap smears.  She will get her flu vaccine through work.  Lab work as outlined.      IBS (irritable bowel syndrome)     I discussed that her symptoms are most consistent with IBS.  Discussed that I cannot 100% tell her that she does not have colon cancer though her symptomatology and age would make this quite unlikely.  The patient was accepting of the fact that this is most likely IBS and opted to monitor at this time.  Discussed if she has persistent issues with diarrhea or loose stools that are not improving with reduction in her stress levels she should let me know so we can have her see GI to consider further treatment or evaluation.      RESOLVED: Lipid screening   Relevant Orders   Comp Met (CMET)   Lipid panel   Psoriasis of scalp    Patient with persistent scalp psoriasis symptoms.  Has responded well to clobetasol shampoo in the past.  This was refilled for her.  Breast-feeding considerations reviewed and up-to-date noting that topical steroids are generally safe in breast-feeding.  She is advised to not get this topical steroid on her breasts.      Other Visit Diagnoses     Screening for deficiency anemia       Relevant Orders   CBC   Scalp psoriasis       Relevant Medications   Clobetasol Propionate 0.05 % shampoo       Return in about 1 year (around 11/09/2022) for CPE.   Tommi Rumps, MD [place]

## 2021-11-08 NOTE — Assessment & Plan Note (Signed)
Physical exam completed.  I encouraged healthy diet and exercise.  She will continue to follow with GYN for her Pap smears.  She will get her flu vaccine through work.  Lab work as outlined.

## 2021-11-08 NOTE — Patient Instructions (Signed)
Nice to see you. We will call you with the lab results.

## 2021-11-08 NOTE — Assessment & Plan Note (Signed)
I discussed that her symptoms are most consistent with IBS.  Discussed that I cannot 100% tell her that she does not have colon cancer though her symptomatology and age would make this quite unlikely.  The patient was accepting of the fact that this is most likely IBS and opted to monitor at this time.  Discussed if she has persistent issues with diarrhea or loose stools that are not improving with reduction in her stress levels she should let me know so we can have her see GI to consider further treatment or evaluation.

## 2021-11-09 LAB — LIPID PANEL
Cholesterol: 172 mg/dL (ref 0–200)
HDL: 93 mg/dL (ref >39.00)
LDL Cholesterol: 69 mg/dL (ref 0–99)
NonHDL: 78.69
Total CHOL/HDL Ratio: 2
Triglycerides: 46 mg/dL (ref 0.0–149.0)
VLDL: 9.2 mg/dL (ref 0.0–40.0)

## 2021-11-09 LAB — COMPREHENSIVE METABOLIC PANEL
ALT: 19 U/L (ref 0–35)
AST: 19 U/L (ref 0–37)
Albumin: 4.3 g/dL (ref 3.5–5.2)
Alkaline Phosphatase: 90 U/L (ref 39–117)
BUN: 17 mg/dL (ref 6–23)
CO2: 28 mEq/L (ref 19–32)
Calcium: 9.8 mg/dL (ref 8.4–10.5)
Chloride: 100 mEq/L (ref 96–112)
Creatinine, Ser: 0.89 mg/dL (ref 0.40–1.20)
GFR: 82.8 mL/min (ref >60.00)
Glucose, Bld: 70 mg/dL (ref 70–99)
Potassium: 4.3 mEq/L (ref 3.5–5.1)
Sodium: 138 mEq/L (ref 135–145)
Total Bilirubin: 0.4 mg/dL (ref 0.2–1.2)
Total Protein: 7.2 g/dL (ref 6.0–8.3)

## 2021-11-09 LAB — CBC
HCT: 39.9 % (ref 36.0–46.0)
Hemoglobin: 13.4 g/dL (ref 12.0–15.0)
MCHC: 33.7 g/dL (ref 30.0–36.0)
MCV: 90.5 fl (ref 78.0–100.0)
Platelets: 170 10*3/uL (ref 150.0–400.0)
RBC: 4.41 Mil/uL (ref 3.87–5.11)
RDW: 14.3 % (ref 11.5–15.5)
WBC: 5.4 10*3/uL (ref 4.0–10.5)

## 2021-11-20 DIAGNOSIS — E038 Other specified hypothyroidism: Secondary | ICD-10-CM | POA: Diagnosis not present

## 2021-11-20 DIAGNOSIS — E063 Autoimmune thyroiditis: Secondary | ICD-10-CM | POA: Diagnosis not present

## 2021-11-21 DIAGNOSIS — F4320 Adjustment disorder, unspecified: Secondary | ICD-10-CM | POA: Diagnosis not present

## 2021-11-22 ENCOUNTER — Other Ambulatory Visit: Payer: Self-pay

## 2021-11-22 DIAGNOSIS — E038 Other specified hypothyroidism: Secondary | ICD-10-CM | POA: Diagnosis not present

## 2021-11-22 DIAGNOSIS — E063 Autoimmune thyroiditis: Secondary | ICD-10-CM | POA: Diagnosis not present

## 2021-11-22 MED ORDER — LEVOTHYROXINE SODIUM 25 MCG PO TABS
ORAL_TABLET | ORAL | 1 refills | Status: DC
  Filled 2021-11-22: qty 90, 90d supply, fill #0
  Filled 2022-05-25: qty 90, 90d supply, fill #1

## 2021-11-23 ENCOUNTER — Other Ambulatory Visit: Payer: Self-pay

## 2021-11-27 ENCOUNTER — Other Ambulatory Visit: Payer: Self-pay | Admitting: Family Medicine

## 2021-11-27 ENCOUNTER — Other Ambulatory Visit: Payer: Self-pay

## 2021-11-28 ENCOUNTER — Other Ambulatory Visit: Payer: Self-pay

## 2021-11-28 ENCOUNTER — Other Ambulatory Visit: Payer: Self-pay | Admitting: Family Medicine

## 2021-11-28 DIAGNOSIS — F4320 Adjustment disorder, unspecified: Secondary | ICD-10-CM | POA: Diagnosis not present

## 2021-11-28 NOTE — Telephone Encounter (Signed)
I do not see this on her current medication list. She has had this in the past. Can you contact her and make sure she needs this and see what prompted the refill request?

## 2021-11-29 ENCOUNTER — Other Ambulatory Visit: Payer: Self-pay

## 2021-11-29 MED FILL — Beclomethasone Diprop HFA Breath Act Inh Aer 40 MCG/ACT: RESPIRATORY_TRACT | 30 days supply | Qty: 10.6 | Fill #0 | Status: AC

## 2021-11-30 ENCOUNTER — Other Ambulatory Visit: Payer: Self-pay

## 2021-12-04 DIAGNOSIS — F4320 Adjustment disorder, unspecified: Secondary | ICD-10-CM | POA: Diagnosis not present

## 2021-12-19 ENCOUNTER — Ambulatory Visit: Payer: 59 | Admitting: Family Medicine

## 2021-12-19 ENCOUNTER — Encounter: Payer: Self-pay | Admitting: Family Medicine

## 2021-12-19 ENCOUNTER — Other Ambulatory Visit: Payer: Self-pay

## 2021-12-19 VITALS — BP 100/60 | HR 82 | Temp 98.4°F | Ht 66.0 in | Wt 134.6 lb

## 2021-12-19 DIAGNOSIS — F419 Anxiety disorder, unspecified: Secondary | ICD-10-CM

## 2021-12-19 DIAGNOSIS — R102 Pelvic and perineal pain: Secondary | ICD-10-CM | POA: Insufficient documentation

## 2021-12-19 DIAGNOSIS — F32A Depression, unspecified: Secondary | ICD-10-CM | POA: Diagnosis not present

## 2021-12-19 DIAGNOSIS — R634 Abnormal weight loss: Secondary | ICD-10-CM | POA: Diagnosis not present

## 2021-12-19 LAB — COMPREHENSIVE METABOLIC PANEL
ALT: 13 U/L (ref 0–35)
AST: 14 U/L (ref 0–37)
Albumin: 4.3 g/dL (ref 3.5–5.2)
Alkaline Phosphatase: 79 U/L (ref 39–117)
BUN: 16 mg/dL (ref 6–23)
CO2: 30 mEq/L (ref 19–32)
Calcium: 9.4 mg/dL (ref 8.4–10.5)
Chloride: 102 mEq/L (ref 96–112)
Creatinine, Ser: 0.98 mg/dL (ref 0.40–1.20)
GFR: 73.71 mL/min (ref >60.00)
Glucose, Bld: 99 mg/dL (ref 70–99)
Potassium: 4 mEq/L (ref 3.5–5.1)
Sodium: 140 mEq/L (ref 135–145)
Total Bilirubin: 0.5 mg/dL (ref 0.2–1.2)
Total Protein: 7.1 g/dL (ref 6.0–8.3)

## 2021-12-19 LAB — CBC WITH DIFFERENTIAL/PLATELET
Basophils Absolute: 0 10*3/uL (ref 0.0–0.1)
Basophils Relative: 1 % (ref 0.0–3.0)
Eosinophils Absolute: 0.1 10*3/uL (ref 0.0–0.7)
Eosinophils Relative: 2.1 % (ref 0.0–5.0)
HCT: 41.2 % (ref 36.0–46.0)
Hemoglobin: 13.7 g/dL (ref 12.0–15.0)
Lymphocytes Relative: 30.8 % (ref 12.0–46.0)
Lymphs Abs: 1.4 10*3/uL (ref 0.7–4.0)
MCHC: 33.3 g/dL (ref 30.0–36.0)
MCV: 91.1 fl (ref 78.0–100.0)
Monocytes Absolute: 0.3 10*3/uL (ref 0.1–1.0)
Monocytes Relative: 6.4 % (ref 3.0–12.0)
Neutro Abs: 2.8 10*3/uL (ref 1.4–7.7)
Neutrophils Relative %: 59.7 % (ref 43.0–77.0)
Platelets: 159 10*3/uL (ref 150.0–400.0)
RBC: 4.53 Mil/uL (ref 3.87–5.11)
RDW: 14.4 % (ref 11.5–15.5)
WBC: 4.7 10*3/uL (ref 4.0–10.5)

## 2021-12-19 MED ORDER — SERTRALINE HCL 50 MG PO TABS
ORAL_TABLET | ORAL | 1 refills | Status: DC
  Filled 2021-12-19: qty 90, 90d supply, fill #0

## 2021-12-19 NOTE — Patient Instructions (Signed)
Nice to see you. We are going to start on Zoloft 25 mg once daily for 14 days.  You will then increase to 50 mg daily.  If you notice any side effects with this medication please let us know. Somebody should contact you to set up the ultrasound.  If you do not hear from them by the end of the week please let us know.

## 2021-12-19 NOTE — Assessment & Plan Note (Signed)
Likely multifactorial.  Her anxiety could be playing a role or could be related to one of her medications such as her sleep medicine or her restarting her Synthroid.  Could be related to what ever is causing her abdominal pain.  We will get some lab work.  We are treating her anxiety as outlined.  She will have her TSH rechecked next week through endocrinology.  We are working up her pelvic discomfort as outlined as well.

## 2021-12-19 NOTE — Assessment & Plan Note (Signed)
Anxiety has worsened quite a bit.  Discussed starting medication.  Patient noted she wanted to start with Zoloft as it is the safest for breast-feeding.  We did discuss having her bottlefeed with her breastmilk that has been saved already to minimize any risk from the Zoloft though I did discuss that the Zoloft is an acceptable medication to use when breast-feeding.  She is aware of possible side effects of Zoloft and if they occur she can let me know.  She will also let me know if she wants to switch to something different in the future when she knows if she is not going to breast-feed anymore.

## 2021-12-19 NOTE — Progress Notes (Signed)
Tommi Rumps, MD Phone: [phone]  Tanya Glenn is a 36 y.o. female who presents today for same-day visit.  Anxiety/depression: This has worsened recently.  This is mostly related to anxiety.  She has mild depression.  Her mom was recently diagnosed with stage IV cancer after previously undergoing treatment for breast cancer.  She has intrusive and ruminating thoughts.  She has been doing therapy though she is not sure its been all that helpful.  She does breast-feed though she thinks she has enough breastmilk to last 6 months to when her child ends up being a-year-old.  She has taken Zoloft in the past and had night sweats and some drowsiness with it.  Weight loss: This has occurred over the last month or so.  She is back on her sleep medications and thinks that could be playing a role.  She does occasionally have abdominal discomfort that typically occurs before she has to use the bathroom and then she will have loose stools.  No vomiting.  For the last 1 to 2 weeks she has had some left lower pelvic discomfort.  She also started on her Synthroid again a few weeks ago though she reports she has not had any hyperthyroid symptoms.  LMP is today.  Social History   Tobacco Use  Smoking Status Never  Smokeless Tobacco Never    Current Outpatient Medications on File Prior to Visit  Medication Sig Dispense Refill   acetaminophen (TYLENOL) 325 MG tablet Take 2 tablets (650 mg total) by mouth every 4 (four) hours as needed (for pain scale < 4).     beclomethasone (QVAR REDIHALER) 40 MCG/ACT inhaler INHALE 2 PUFFS INTO THE LUNGS TWO TIMES DAILY 10.6 g 2   Clobetasol Propionate 0.05 % shampoo Apply thin film to dry scalp once daily (maximum dose: 50 g/week or 50 mL/week); leave in place for 15 minutes, then add water, lather, and rinse thoroughly. Limit treatment to 4 consecutive weeks. 118 mL 0   ibuprofen (ADVIL) 600 MG tablet Take 1 tablet (600 mg total) by mouth every 6 (six) hours. 30  tablet 0   levothyroxine (SYNTHROID) 25 MCG tablet Take 1 tablet (25 mcg total) by mouth once daily Take on an empty stomach with a glass of water at least 30-60 minutes before breakfast. 90 tablet 1   omega-3 acid ethyl esters (LOVAZA) 1 g capsule Take by mouth 2 (two) times daily.     Prenatal MV-Min-Fe Fum-FA-DHA (PRENATAL 1 PO) Take by mouth.     senna-docusate (SENOKOT-S) 8.6-50 MG tablet Take 2 tablets by mouth daily.     Sodium Oxybate (XYREM PO) Take 3.75 g by mouth 2 (two) times daily. Taking at night.     albuterol (VENTOLIN HFA) 108 (90 Base) MCG/ACT inhaler INHALE 1 PUFF BY MOUTH EVERY 4 HOURS AS NEEDED FOR WHEEZING OR SHORTNESS OF BREATH. 18 g 2   No current facility-administered medications on file prior to visit.     ROS see history of present illness  Objective  Physical Exam Vitals:   12/19/21 0828  BP: 100/60  Pulse: 82  Temp: 98.4 F (36.9 C)  SpO2: 99%    BP Readings from Last 3 Encounters:  12/19/21 100/60  11/08/21 90/60  06/27/21 100/70   Wt Readings from Last 3 Encounters:  12/19/21 134 lb 9.6 oz (61.1 kg)  11/08/21 145 lb (65.8 kg)  06/25/21 184 lb (83.5 kg)    Physical Exam Constitutional:      General: She is not in  acute distress.    Appearance: She is not diaphoretic.  Cardiovascular:     Rate and Rhythm: Normal rate and regular rhythm.     Heart sounds: Normal heart sounds.  Pulmonary:     Effort: Pulmonary effort is normal.     Breath sounds: Normal breath sounds.  Abdominal:     General: Bowel sounds are normal. There is no distension.     Palpations: Abdomen is soft.     Tenderness: There is abdominal tenderness (Tender left lower quadrant and left pelvic area).  Skin:    General: Skin is warm and dry.  Neurological:     Mental Status: She is alert.      Assessment/Plan: Please see individual problem list.  Problem List Items Addressed This Visit     Anxiety and depression - Primary    Anxiety has worsened quite a bit.   Discussed starting medication.  Patient noted she wanted to start with Zoloft as it is the safest for breast-feeding.  We did discuss having her bottlefeed with her breastmilk that has been saved already to minimize any risk from the Zoloft though I did discuss that the Zoloft is an acceptable medication to use when breast-feeding.  She is aware of possible side effects of Zoloft and if they occur she can let me know.  She will also let me know if she wants to switch to something different in the future when she knows if she is not going to breast-feed anymore.      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  Her pelvic discomfort could be related to an ovarian cyst given that it started a little before her menstrual cycle.  Given her pain we will get an ultrasound to evaluate her ovaries.  Discussed getting a urine pregnancy test though she declined this.      Relevant Orders   US Pelvic Complete With Transvaginal   Weight loss    Likely multifactorial.  Her anxiety could be playing a role or could be related to one of her medications such as her sleep medicine or her restarting her Synthroid.  Could be related to what ever is causing her abdominal pain.  We will get some lab work.  We are treating her anxiety as outlined.  She will have her TSH rechecked next week through endocrinology.  We are working up her pelvic discomfort as outlined as well.      Relevant Orders   Comp Met (CMET)   CBC w/Diff    Return in about 6 weeks (around 01/30/2022) for Anxiety/depression.  I have spent 32 minutes in the care of this patient regarding history taking, documentation, completion of exam, discussion of plan, placing orders.   Tommi Rumps, MD [place]

## 2021-12-19 NOTE — Assessment & Plan Note (Signed)
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  Her pelvic discomfort could be related to an ovarian cyst given that it started a little before her menstrual cycle.  Given her pain we will get an ultrasound to evaluate her ovaries.  Discussed getting a urine pregnancy test though she declined this.

## 2021-12-21 ENCOUNTER — Ambulatory Visit
Admission: RE | Admit: 2021-12-21 | Discharge: 2021-12-21 | Disposition: A | Discharge status: Home / Self Care | Payer: 59 | Source: Ambulatory Visit | Attending: Family Medicine | Admitting: Family Medicine

## 2021-12-21 DIAGNOSIS — R102 Pelvic and perineal pain: Secondary | ICD-10-CM | POA: Diagnosis not present

## 2021-12-21 DIAGNOSIS — R188 Other ascites: Secondary | ICD-10-CM | POA: Diagnosis not present

## 2021-12-25 ENCOUNTER — Ambulatory Visit: Payer: 59 | Admitting: Family Medicine

## 2021-12-25 DIAGNOSIS — F4320 Adjustment disorder, unspecified: Secondary | ICD-10-CM | POA: Diagnosis not present

## 2021-12-27 DIAGNOSIS — E063 Autoimmune thyroiditis: Secondary | ICD-10-CM | POA: Diagnosis not present

## 2021-12-27 DIAGNOSIS — E038 Other specified hypothyroidism: Secondary | ICD-10-CM | POA: Diagnosis not present

## 2022-01-08 DIAGNOSIS — F4320 Adjustment disorder, unspecified: Secondary | ICD-10-CM | POA: Diagnosis not present

## 2022-01-22 DIAGNOSIS — F4320 Adjustment disorder, unspecified: Secondary | ICD-10-CM | POA: Diagnosis not present

## 2022-01-31 ENCOUNTER — Ambulatory Visit: Payer: 59 | Admitting: Family Medicine

## 2022-01-31 IMAGING — US US OB LIMITED
1 series · 1 of 1 positions shown · non-contrast
Comparison: none

CLINICAL DATA: 35-year-old pregnant female with second trimester
bleeding, assess cervix.

EXAM:
LIMITED OBSTETRIC ULTRASOUND AND TRANSVAGINAL OBSTETRIC ULTRASOUND

[Series 1001: ob us · 1 of 1 slices shown]
[im 1/1]
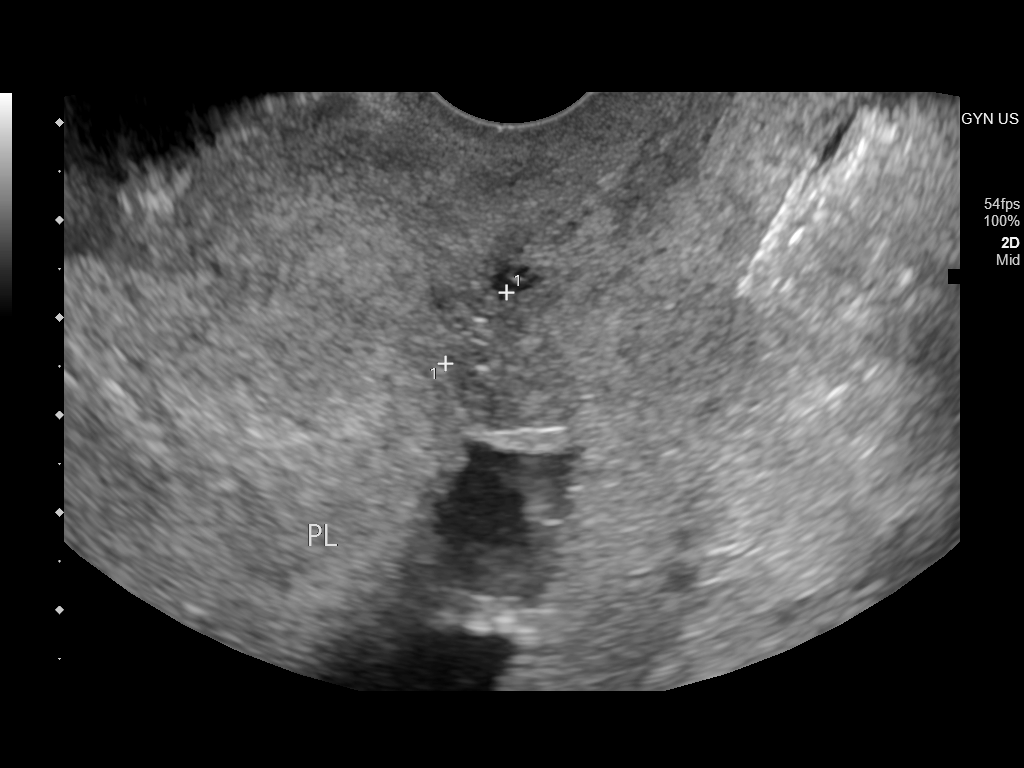

[1 of 1 positions shown; findings below may reference images not displayed]

FINDINGS: Number of Fetuses: 1

Heart Rate:  155 bpm

Movement: Yes

Presentation: Cephalic

Previa: Suggestion of marginal placenta previa on transvaginal
views, although differential includes uterine contraction, as the
inferior placental tip appears greater than 3 cm from the internal
cervical os on transabdominal views.

Placental Location: Anterior

Amniotic Fluid (Subjective): Normal

Deepest vertical fluid pocket: 5.1 cm

BPD:  4.5cm 19w 3d

Maternal Findings:

Cervix: Cervix length 3.5 cm on transvaginal views, with no evidence
of internal cervical funneling.

Uterus/Adnexae: No abnormality visualized.
IMPRESSION: 1. Single living intrauterine gestation at 19 weeks 3 days by
limited fetal biometry, concordant with assigned dating.
2. Normal cervix length 3.5 cm on transvaginal views. Normal
amniotic fluid volume.
3. Questionable anterior marginal placenta previa on transvaginal
views, potentially artifactual due to uterine contraction. Suggest
attention on short-term follow-up obstetric scan in 2-4 weeks.

## 2022-02-05 DIAGNOSIS — F4320 Adjustment disorder, unspecified: Secondary | ICD-10-CM | POA: Diagnosis not present

## 2022-02-08 ENCOUNTER — Ambulatory Visit: Payer: 59 | Admitting: Family Medicine

## 2022-02-08 ENCOUNTER — Encounter: Payer: Self-pay | Admitting: Family Medicine

## 2022-02-08 VITALS — BP 90/60 | HR 70 | Temp 98.4°F | Ht 66.0 in | Wt 131.6 lb

## 2022-02-08 DIAGNOSIS — F32A Depression, unspecified: Secondary | ICD-10-CM | POA: Diagnosis not present

## 2022-02-08 DIAGNOSIS — F419 Anxiety disorder, unspecified: Secondary | ICD-10-CM

## 2022-02-08 NOTE — Assessment & Plan Note (Signed)
Offer my condolences.  She will continue Zoloft 25 mg daily.  She knows how to access hospice grief counseling.  Follow-up in 6 months.  She will let me know if she wants to switch to a different SSRI when she is no longer pumping.

## 2022-02-08 NOTE — Progress Notes (Signed)
  Marikay Alar, MD Phone: [phone]  Tanya Glenn is a 36 y.o. female who presents today for f/u.  Anxiety/depression: Generally doing okay considering the circumstances of losing her mother very recently.  She is grieving to a certain degree.  She notes that Zoloft 25 mg daily has helped stabilize her mood swings.  No SI or HI.  She does get somewhat drowsy with the Zoloft.  Social History   Tobacco Use  Smoking Status Never  Smokeless Tobacco Never    Current Outpatient Medications on File Prior to Visit  Medication Sig Dispense Refill   acetaminophen (TYLENOL) 325 MG tablet Take 2 tablets (650 mg total) by mouth every 4 (four) hours as needed (for pain scale < 4).     beclomethasone (QVAR REDIHALER) 40 MCG/ACT inhaler INHALE 2 PUFFS INTO THE LUNGS TWO TIMES DAILY 10.6 g 2   Clobetasol Propionate 0.05 % shampoo Apply thin film to dry scalp once daily (maximum dose: 50 g/week or 50 mL/week); leave in place for 15 minutes, then add water, lather, and rinse thoroughly. Limit treatment to 4 consecutive weeks. 118 mL 0   ibuprofen (ADVIL) 600 MG tablet Take 1 tablet (600 mg total) by mouth every 6 (six) hours. 30 tablet 0   levothyroxine (SYNTHROID) 25 MCG tablet Take 1 tablet (25 mcg total) by mouth once daily Take on an empty stomach with a glass of water at least 30-60 minutes before breakfast. 90 tablet 1   omega-3 acid ethyl esters (LOVAZA) 1 g capsule Take by mouth 2 (two) times daily.     Prenatal MV-Min-Fe Fum-FA-DHA (PRENATAL 1 PO) Take by mouth.     senna-docusate (SENOKOT-S) 8.6-50 MG tablet Take 2 tablets by mouth daily.     sertraline (ZOLOFT) 50 MG tablet Take 0.5 tablets (25 mg total) by mouth daily for 14 days, THEN 1 tablet (50 mg total) daily. 90 tablet 1   Sodium Oxybate (XYREM PO) Take 3.75 g by mouth 2 (two) times daily. Taking at night.     albuterol (VENTOLIN HFA) 108 (90 Base) MCG/ACT inhaler INHALE 1 PUFF BY MOUTH EVERY 4 HOURS AS NEEDED FOR WHEEZING OR  SHORTNESS OF BREATH. 18 g 2   No current facility-administered medications on file prior to visit.     ROS see history of present illness  Objective  Physical Exam Vitals:   02/08/22 1601  BP: 90/60  Pulse: 70  Temp: 98.4 F (36.9 C)  SpO2: 99%    BP Readings from Last 3 Encounters:  02/08/22 90/60  12/19/21 100/60  11/08/21 90/60   Wt Readings from Last 3 Encounters:  02/08/22 131 lb 9.6 oz (59.7 kg)  12/19/21 134 lb 9.6 oz (61.1 kg)  11/08/21 145 lb (65.8 kg)    Physical Exam Constitutional:      General: She is not in acute distress. Neurological:     Mental Status: She is alert.      Assessment/Plan: Please see individual problem list.  Anxiety and depression Assessment & Plan: Offer my condolences.  She will continue Zoloft 25 mg daily.  She knows how to access hospice grief counseling.  Follow-up in 6 months.  She will let me know if she wants to switch to a different SSRI when she is no longer pumping.     Return in about 6 months (around 08/10/2022).   Marikay Alar, MD [facility]

## 2022-02-16 ENCOUNTER — Other Ambulatory Visit: Payer: Self-pay

## 2022-02-16 MED ORDER — ERYTHROMYCIN 5 MG/GM OP OINT
TOPICAL_OINTMENT | OPHTHALMIC | 0 refills | Status: DC
  Filled 2022-02-16: qty 3.5, 7d supply, fill #0

## 2022-02-19 NOTE — L&D Delivery Note (Signed)
Delivery Note  Date of delivery: 12/19/2022 Estimated Date of Delivery: 01/14/23 Patient's last menstrual period was 04/09/2022. EGA: [redacted]w[redacted]d  Delivery Note At 1:11 AM a viable female was delivered via Vaginal, Spontaneous (Presentation: Right Occiput Anterior).  APGAR: 8, 9; weight pending.  Placenta status: Spontaneous, Intact.  Cord: 3 vessels with the following complications: None.    First Stage: Labor onset: unknown Induction : Cytotec Analgesia /Anesthesia intrapartum: Epidural SROM at 0059  Tanya Glenn presented to L&D for IOL for Pre-e with severe features. She was induced with 1 dose of cytotec. Epidural placed for pain relief.   Second Stage: Complete dilation at 0050 Onset of pushing at 0104 FHR second stage Strip broken, unable to assess  Delivery at 0111 on 12/19/2022  She progressed to complete and had a spontaneous vaginal birth of a live female over an intact perineum. The fetal head was delivered in OA position with restitution to ROA. No nuchal cord. Anterior then posterior shoulders delivered spontaneously. Baby placed on mom's abdomen and attended to by transition RN. Cord clamped and cut after 1+ mins by FOB. Cord blood obtained for newborn labs.  Third Stage: Placenta delivered intact with 3VC at 0116 Placenta disposition: routine disposal Uterine tone firm / bleeding min IV pitocin given for hemorrhage prophylaxis  Anesthesia: Epidural Episiotomy: None Lacerations: None Suture Repair: n/a Est. Blood Loss (mL):  200  Complications: none  Mom to postpartum.  Baby to Couplet care / Skin to Skin.  Newborn: Birth Weight: pending  Apgar Scores: 8, 9 Feeding planned: breastfeeding   [name], CNM 12/19/2022 1:24 AM

## 2022-02-22 ENCOUNTER — Ambulatory Visit (INDEPENDENT_AMBULATORY_CARE_PROVIDER_SITE_OTHER): Payer: 59 | Admitting: Family Medicine

## 2022-02-22 ENCOUNTER — Other Ambulatory Visit: Payer: Self-pay

## 2022-02-22 ENCOUNTER — Encounter: Payer: Self-pay | Admitting: Family Medicine

## 2022-02-22 VITALS — BP 90/60 | HR 65 | Temp 97.8°F | Ht 66.0 in | Wt 131.2 lb

## 2022-02-22 DIAGNOSIS — H0019 Chalazion unspecified eye, unspecified eyelid: Secondary | ICD-10-CM | POA: Insufficient documentation

## 2022-02-22 DIAGNOSIS — J0100 Acute maxillary sinusitis, unspecified: Secondary | ICD-10-CM | POA: Diagnosis not present

## 2022-02-22 DIAGNOSIS — H0011 Chalazion right upper eyelid: Secondary | ICD-10-CM | POA: Diagnosis not present

## 2022-02-22 DIAGNOSIS — J329 Chronic sinusitis, unspecified: Secondary | ICD-10-CM | POA: Insufficient documentation

## 2022-02-22 MED ORDER — AMOXICILLIN-POT CLAVULANATE 875-125 MG PO TABS
1.0000 | ORAL_TABLET | Freq: Two times a day (BID) | ORAL | 0 refills | Status: DC
  Filled 2022-02-22: qty 14, 7d supply, fill #0

## 2022-02-22 NOTE — Assessment & Plan Note (Signed)
I will proceed with treatment with Augmentin to cover for bacterial sinusitis given duration of symptoms.  She will monitor for diarrhea with this.

## 2022-02-22 NOTE — Assessment & Plan Note (Signed)
Refer to ophthalmology for likely chalazion.

## 2022-02-22 NOTE — Progress Notes (Signed)
Tommi Rumps, MD Phone: [phone]  Tanya Glenn is a 37 y.o. female who presents today for same-day visit.  Stye/eye infection: Patient notes she developed a stye on Christmas Eve.  She used warm compresses and it went away though came back the next day.  She subsequently developed eye irritation with erythema and purulent material.  She did a virtual visit and was prescribed erythromycin.  She notes her eye symptoms have improved significantly though she does still have a nodule where the stye was.  She notes it just feels like there is something there though notes no significant pain related to this.  Sinus congestion: This has been going on greater than a week.  She has right maxillary sinus pressure and is blowing some green mucus out of her nose this morning.  She had possible flu exposure.  She had negative COVID test at home.  No fevers.  Social History   Tobacco Use  Smoking Status Never  Smokeless Tobacco Never    Current Outpatient Medications on File Prior to Visit  Medication Sig Dispense Refill   acetaminophen (TYLENOL) 325 MG tablet Take 2 tablets (650 mg total) by mouth every 4 (four) hours as needed (for pain scale < 4).     beclomethasone (QVAR REDIHALER) 40 MCG/ACT inhaler INHALE 2 PUFFS INTO THE LUNGS TWO TIMES DAILY 10.6 g 2   Clobetasol Propionate 0.05 % shampoo Apply thin film to dry scalp once daily (maximum dose: 50 g/week or 50 mL/week); leave in place for 15 minutes, then add water, lather, and rinse thoroughly. Limit treatment to 4 consecutive weeks. 118 mL 0   erythromycin ophthalmic ointment Place 1cm ribbon to affected eye 4 times a day for 5 to 7 days 3.5 g 0   ibuprofen (ADVIL) 600 MG tablet Take 1 tablet (600 mg total) by mouth every 6 (six) hours. 30 tablet 0   levothyroxine (SYNTHROID) 25 MCG tablet Take 1 tablet (25 mcg total) by mouth once daily Take on an empty stomach with a glass of water at least 30-60 minutes before breakfast. 90 tablet 1    omega-3 acid ethyl esters (LOVAZA) 1 g capsule Take by mouth 2 (two) times daily.     Prenatal MV-Min-Fe Fum-FA-DHA (PRENATAL 1 PO) Take by mouth.     senna-docusate (SENOKOT-S) 8.6-50 MG tablet Take 2 tablets by mouth daily.     sertraline (ZOLOFT) 50 MG tablet Take 0.5 tablets (25 mg total) by mouth daily for 14 days, THEN 1 tablet (50 mg total) daily. 90 tablet 1   Sodium Oxybate (XYREM PO) Take 3.75 g by mouth 2 (two) times daily. Taking at night.     albuterol (VENTOLIN HFA) 108 (90 Base) MCG/ACT inhaler INHALE 1 PUFF BY MOUTH EVERY 4 HOURS AS NEEDED FOR WHEEZING OR SHORTNESS OF BREATH. 18 g 2   No current facility-administered medications on file prior to visit.     ROS see history of present illness  Objective  Physical Exam Vitals:   02/22/22 1209  BP: 90/60  Pulse: 65  Temp: 97.8 F (36.6 C)  SpO2: 98%    BP Readings from Last 3 Encounters:  02/22/22 90/60  02/08/22 90/60  12/19/21 100/60   Wt Readings from Last 3 Encounters:  02/22/22 131 lb 3.2 oz (59.5 kg)  02/08/22 131 lb 9.6 oz (59.7 kg)  12/19/21 134 lb 9.6 oz (61.1 kg)    Physical Exam Constitutional:      General: She is not in acute distress.  Appearance: She is not diaphoretic.  HENT:     Head: Normocephalic and atraumatic.     Right Ear: Tympanic membrane normal.     Left Ear: Tympanic membrane normal.  Eyes:     Conjunctiva/sclera: Conjunctivae normal.   Pulmonary:     Effort: Pulmonary effort is normal.  Neurological:     Mental Status: She is alert.      Assessment/Plan: Please see individual problem list.  Chalazion of right upper eyelid Assessment & Plan: Refer to ophthalmology for likely chalazion.  Orders: -     Ambulatory referral to Ophthalmology  Acute non-recurrent maxillary sinusitis Assessment & Plan: I will proceed with treatment with Augmentin to cover for bacterial sinusitis given duration of symptoms.  She will monitor for diarrhea with this.  Orders: -      Amoxicillin-Pot Clavulanate; Take 1 tablet by mouth 2 (two) times daily.  Dispense: 14 tablet; Refill: 0    Return if symptoms worsen or fail to improve.   Tommi Rumps, MD [place]

## 2022-02-22 NOTE — Telephone Encounter (Signed)
Can you call her and see if she can come in at 12 pm today? Thanks.

## 2022-02-26 DIAGNOSIS — F4320 Adjustment disorder, unspecified: Secondary | ICD-10-CM | POA: Diagnosis not present

## 2022-03-01 ENCOUNTER — Encounter: Payer: Self-pay | Admitting: Family Medicine

## 2022-03-02 ENCOUNTER — Telehealth: Payer: Self-pay

## 2022-03-02 NOTE — Telephone Encounter (Signed)
Brandy called from [facility] to state patient has been scheduled to see Dr. Delman Cheadle on 03/06/2022 and they are requesting office notes to support the referral from Dr. Tommi Rumps as soon as possible.  Brandy asked that we please fax this information to [phone], to her attention

## 2022-03-06 DIAGNOSIS — H0011 Chalazion right upper eyelid: Secondary | ICD-10-CM | POA: Diagnosis not present

## 2022-03-20 ENCOUNTER — Telehealth: Payer: Self-pay | Admitting: Family Medicine

## 2022-03-20 NOTE — Telephone Encounter (Signed)
Lft pt vm to call ofc regarding referral. thanks 

## 2022-03-22 ENCOUNTER — Encounter: Payer: Self-pay | Admitting: Family Medicine

## 2022-03-26 DIAGNOSIS — F4320 Adjustment disorder, unspecified: Secondary | ICD-10-CM | POA: Diagnosis not present

## 2022-03-29 ENCOUNTER — Encounter: Payer: Self-pay | Admitting: *Deleted

## 2022-03-29 DIAGNOSIS — Z1239 Encounter for other screening for malignant neoplasm of breast: Secondary | ICD-10-CM

## 2022-03-29 NOTE — Progress Notes (Signed)
Spoke to [facility] breast center and patients are able to get screening mammograms while breast feeding.   Left VM with this information and orders have been entered for the mammogram per Dr. Janese Banks.  [place]'s phone number was given to her as well to call and schedule her mammogram at her convenience.

## 2022-04-04 DIAGNOSIS — E038 Other specified hypothyroidism: Secondary | ICD-10-CM | POA: Diagnosis not present

## 2022-04-04 DIAGNOSIS — E063 Autoimmune thyroiditis: Secondary | ICD-10-CM | POA: Diagnosis not present

## 2022-04-10 DIAGNOSIS — E063 Autoimmune thyroiditis: Secondary | ICD-10-CM | POA: Diagnosis not present

## 2022-04-10 DIAGNOSIS — E038 Other specified hypothyroidism: Secondary | ICD-10-CM | POA: Diagnosis not present

## 2022-04-17 ENCOUNTER — Ambulatory Visit
Admission: RE | Admit: 2022-04-17 | Discharge: 2022-04-17 | Disposition: A | Discharge status: Home / Self Care | Payer: 59 | Source: Ambulatory Visit | Attending: Oncology | Admitting: Oncology

## 2022-04-17 DIAGNOSIS — Z1231 Encounter for screening mammogram for malignant neoplasm of breast: Secondary | ICD-10-CM | POA: Insufficient documentation

## 2022-04-17 DIAGNOSIS — Z803 Family history of malignant neoplasm of breast: Secondary | ICD-10-CM | POA: Diagnosis not present

## 2022-04-17 DIAGNOSIS — Z1239 Encounter for other screening for malignant neoplasm of breast: Secondary | ICD-10-CM

## 2022-04-19 ENCOUNTER — Other Ambulatory Visit: Payer: Self-pay | Admitting: Oncology

## 2022-04-19 DIAGNOSIS — R928 Other abnormal and inconclusive findings on diagnostic imaging of breast: Secondary | ICD-10-CM

## 2022-04-19 DIAGNOSIS — R921 Mammographic calcification found on diagnostic imaging of breast: Secondary | ICD-10-CM

## 2022-04-20 ENCOUNTER — Ambulatory Visit
Admission: RE | Admit: 2022-04-20 | Discharge: 2022-04-20 | Disposition: A | Discharge status: Home / Self Care | Payer: 59 | Source: Ambulatory Visit | Attending: Oncology | Admitting: Oncology

## 2022-04-20 ENCOUNTER — Other Ambulatory Visit: Payer: Self-pay | Admitting: Oncology

## 2022-04-20 ENCOUNTER — Telehealth: Payer: Self-pay | Admitting: *Deleted

## 2022-04-20 DIAGNOSIS — R928 Other abnormal and inconclusive findings on diagnostic imaging of breast: Secondary | ICD-10-CM | POA: Diagnosis not present

## 2022-04-20 DIAGNOSIS — R921 Mammographic calcification found on diagnostic imaging of breast: Secondary | ICD-10-CM

## 2022-04-20 NOTE — Telephone Encounter (Signed)
Called the pt to see if she can get diagnostic mammogram and they have opening today 10:40 or 3:15. She called me back and said that she can come today 10:40 and I called mammography and spoke to [place].

## 2022-04-25 ENCOUNTER — Ambulatory Visit
Admission: RE | Admit: 2022-04-25 | Discharge: 2022-04-25 | Disposition: A | Discharge status: Home / Self Care | Payer: 59 | Source: Ambulatory Visit | Attending: Oncology | Admitting: Oncology

## 2022-04-25 ENCOUNTER — Other Ambulatory Visit: Payer: Self-pay

## 2022-04-25 DIAGNOSIS — Z803 Family history of malignant neoplasm of breast: Secondary | ICD-10-CM | POA: Diagnosis not present

## 2022-04-25 DIAGNOSIS — R928 Other abnormal and inconclusive findings on diagnostic imaging of breast: Secondary | ICD-10-CM

## 2022-04-25 DIAGNOSIS — R921 Mammographic calcification found on diagnostic imaging of breast: Secondary | ICD-10-CM | POA: Insufficient documentation

## 2022-04-25 HISTORY — PX: BREAST BIOPSY: SHX20

## 2022-04-25 MED ORDER — LIDOCAINE HCL (PF) 1 % IJ SOLN
15.0000 mL | Freq: Once | INTRAMUSCULAR | Status: AC
  Administered 2022-04-25: 15 mL
  Filled 2022-04-25: qty 15

## 2022-04-25 MED ORDER — LIDOCAINE-EPINEPHRINE 1 %-1:100000 IJ SOLN
10.0000 mL | Freq: Once | INTRAMUSCULAR | Status: AC
  Administered 2022-04-25: 10 mL
  Filled 2022-04-25: qty 10

## 2022-04-26 ENCOUNTER — Other Ambulatory Visit: Payer: Self-pay

## 2022-04-26 LAB — SURGICAL PATHOLOGY

## 2022-04-26 MED ORDER — AMOXICILLIN 500 MG PO CAPS
500.0000 mg | ORAL_CAPSULE | Freq: Three times a day (TID) | ORAL | 0 refills | Status: DC
  Filled 2022-04-26: qty 21, 7d supply, fill #0

## 2022-04-26 MED ORDER — CHLORHEXIDINE GLUCONATE 0.12 % MT SOLN
OROMUCOSAL | 0 refills | Status: DC
  Filled 2022-04-26: qty 473, 16d supply, fill #0

## 2022-04-27 ENCOUNTER — Other Ambulatory Visit: Payer: Self-pay

## 2022-04-27 MED ORDER — IBUPROFEN 600 MG PO TABS
600.0000 mg | ORAL_TABLET | Freq: Four times a day (QID) | ORAL | 0 refills | Status: DC | PRN
  Filled 2022-04-27: qty 20, 5d supply, fill #0

## 2022-04-27 MED ORDER — OXYCODONE HCL 5 MG PO TABS
5.0000 mg | ORAL_TABLET | Freq: Four times a day (QID) | ORAL | 0 refills | Status: DC | PRN
  Filled 2022-04-27: qty 6, 2d supply, fill #0

## 2022-04-30 DIAGNOSIS — F4320 Adjustment disorder, unspecified: Secondary | ICD-10-CM | POA: Diagnosis not present

## 2022-05-21 DIAGNOSIS — O09891 Supervision of other high risk pregnancies, first trimester: Secondary | ICD-10-CM | POA: Diagnosis not present

## 2022-05-21 DIAGNOSIS — O200 Threatened abortion: Secondary | ICD-10-CM | POA: Diagnosis not present

## 2022-05-21 DIAGNOSIS — E063 Autoimmune thyroiditis: Secondary | ICD-10-CM | POA: Diagnosis not present

## 2022-05-21 DIAGNOSIS — E038 Other specified hypothyroidism: Secondary | ICD-10-CM | POA: Diagnosis not present

## 2022-05-23 DIAGNOSIS — O200 Threatened abortion: Secondary | ICD-10-CM | POA: Diagnosis not present

## 2022-05-23 DIAGNOSIS — O09891 Supervision of other high risk pregnancies, first trimester: Secondary | ICD-10-CM | POA: Diagnosis not present

## 2022-05-25 ENCOUNTER — Other Ambulatory Visit: Payer: Self-pay

## 2022-05-25 DIAGNOSIS — O200 Threatened abortion: Secondary | ICD-10-CM | POA: Diagnosis not present

## 2022-05-25 DIAGNOSIS — O09891 Supervision of other high risk pregnancies, first trimester: Secondary | ICD-10-CM | POA: Diagnosis not present

## 2022-05-28 DIAGNOSIS — Z3A01 Less than 8 weeks gestation of pregnancy: Secondary | ICD-10-CM | POA: Diagnosis not present

## 2022-05-28 DIAGNOSIS — O3680X0 Pregnancy with inconclusive fetal viability, not applicable or unspecified: Secondary | ICD-10-CM | POA: Diagnosis not present

## 2022-06-13 DIAGNOSIS — Z3481 Encounter for supervision of other normal pregnancy, first trimester: Secondary | ICD-10-CM | POA: Diagnosis not present

## 2022-06-18 DIAGNOSIS — O0991 Supervision of high risk pregnancy, unspecified, first trimester: Secondary | ICD-10-CM | POA: Diagnosis not present

## 2022-06-18 DIAGNOSIS — O3680X0 Pregnancy with inconclusive fetal viability, not applicable or unspecified: Secondary | ICD-10-CM | POA: Diagnosis not present

## 2022-06-18 LAB — OB RESULTS CONSOLE HEPATITIS B SURFACE ANTIGEN: Hepatitis B Surface Ag: NEGATIVE

## 2022-06-18 LAB — OB RESULTS CONSOLE VARICELLA ZOSTER ANTIBODY, IGG: Varicella: IMMUNE

## 2022-06-18 LAB — OB RESULTS CONSOLE RUBELLA ANTIBODY, IGM: Rubella: IMMUNE

## 2022-06-26 ENCOUNTER — Other Ambulatory Visit: Payer: Self-pay

## 2022-06-26 DIAGNOSIS — E063 Autoimmune thyroiditis: Secondary | ICD-10-CM | POA: Diagnosis not present

## 2022-06-26 DIAGNOSIS — O99281 Endocrine, nutritional and metabolic diseases complicating pregnancy, first trimester: Secondary | ICD-10-CM | POA: Diagnosis not present

## 2022-06-26 DIAGNOSIS — E038 Other specified hypothyroidism: Secondary | ICD-10-CM | POA: Diagnosis not present

## 2022-06-26 DIAGNOSIS — E079 Disorder of thyroid, unspecified: Secondary | ICD-10-CM | POA: Diagnosis not present

## 2022-06-26 MED ORDER — LEVOTHYROXINE SODIUM 25 MCG PO TABS
25.0000 ug | ORAL_TABLET | Freq: Every day | ORAL | 1 refills | Status: DC
  Filled 2022-06-26 – 2022-08-26 (×2): qty 90, 90d supply, fill #0
  Filled 2022-12-03 (×3): qty 90, 90d supply, fill #1

## 2022-06-27 ENCOUNTER — Encounter: Payer: Self-pay | Admitting: Oncology

## 2022-06-27 ENCOUNTER — Inpatient Hospital Stay: Payer: 59 | Attending: Oncology | Admitting: Oncology

## 2022-06-27 ENCOUNTER — Ambulatory Visit: Payer: 59 | Admitting: Oncology

## 2022-06-27 VITALS — BP 115/77 | HR 94 | Temp 98.8°F | Resp 16 | Ht 66.0 in | Wt 144.0 lb

## 2022-06-27 DIAGNOSIS — Z803 Family history of malignant neoplasm of breast: Secondary | ICD-10-CM | POA: Diagnosis not present

## 2022-06-27 DIAGNOSIS — Z808 Family history of malignant neoplasm of other organs or systems: Secondary | ICD-10-CM | POA: Insufficient documentation

## 2022-06-27 DIAGNOSIS — Z1502 Genetic susceptibility to malignant neoplasm of ovary: Secondary | ICD-10-CM | POA: Diagnosis not present

## 2022-06-27 DIAGNOSIS — Z1589 Genetic susceptibility to other disease: Secondary | ICD-10-CM | POA: Diagnosis not present

## 2022-06-27 DIAGNOSIS — Z9189 Other specified personal risk factors, not elsewhere classified: Secondary | ICD-10-CM | POA: Diagnosis not present

## 2022-06-27 DIAGNOSIS — Z1501 Genetic susceptibility to malignant neoplasm of breast: Secondary | ICD-10-CM | POA: Diagnosis not present

## 2022-06-27 NOTE — Progress Notes (Signed)
Hematology/Oncology Consult note [facility]  [phone] [phone]  Patient Care Team: Tanya Luis, MD as PCP - General (Family Medicine) Tanya Glenn, NT as Technician   Name of the patient: Yeraldine Glenn  [id number]  1985-09-19   Date of visit: 06/27/22  Diagnosis-at high risk for breast and ovarian cancer  Chief complaint/ Reason for visit-annual breast check visit  Heme/Onc history: patient is a 37 year old female whose mother was found to have triple negative breast cancer at the age of 67 and subsequently diagnosed with BRIP1 gene mutation.  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  Patient has been seen by genetic counseling and given her increased risk of ovarian cancer and possible breast cancer she has been referred to high risk clinic at this time.   Patient is G2P1. She has had problems conceiving. She had a still birth with previous pregnancy. Menarche at the age of 56.  She has used birth control in the past for 18 years.  No prior breast biopsies or mammograms.She has 1 sister with no history of cancer  Interval history-patient is currently [redacted] weeks pregnant and conceived naturally.  Denies any breast concerns at this time  ECOG PS- 0 Pain scale- 0   Review of systems- Review of Systems  Constitutional:  Negative for chills, fever, malaise/fatigue and weight loss.  HENT:  Negative for congestion, ear discharge and nosebleeds.   Eyes:  Negative for blurred vision.  Respiratory:  Negative for cough, hemoptysis, sputum production, shortness of breath and wheezing.   Cardiovascular:  Negative for chest pain, palpitations, orthopnea and claudication.  Gastrointestinal:  Negative for abdominal pain, blood in stool, constipation, diarrhea, heartburn, melena, nausea and vomiting.  Genitourinary:  Negative for dysuria, flank pain, frequency,  hematuria and urgency.  Musculoskeletal:  Negative for back pain, joint pain and myalgias.  Skin:  Negative for rash.  Neurological:  Negative for dizziness, tingling, focal weakness, seizures, weakness and headaches.  Endo/Heme/Allergies:  Does not bruise/bleed easily.  Psychiatric/Behavioral:  Negative for depression and suicidal ideas. The patient does not have insomnia.       Allergies  Allergen Reactions   Sulfa Antibiotics Hives     Past Medical History:  Diagnosis Date   Anxiety    Asthma    Asthma    Depression    Family history of breast cancer    Family history of gene mutation    Narcolepsy    Symptomatic PVCs      Past Surgical History:  Procedure Laterality Date   BREAST BIOPSY Right 04/25/2022   Right Breast Stereo Bx, X clip path pending   BREAST BIOPSY Left 04/25/2022   Left Breast Stereo Bx, Coil clip - path pending   BREAST BIOPSY Right 04/25/2022   MM RT BREAST BX W LOC DEV 1ST LESION IMAGE BX SPEC STEREO GUIDE 04/25/2022 [facility]   BREAST BIOPSY Left 04/25/2022   MM LT BREAST BX W LOC DEV 1ST LESION IMAGE BX SPEC STEREO GUIDE 04/25/2022 [facility]   WISDOM TOOTH EXTRACTION      Social History   Socioeconomic History   Marital status: Married    Spouse name: Tanya Glenn   Number of children: Not on file   Years of education: Not on file   Highest education level: Not on file  Occupational History   Occupation: Pharmacist  Tobacco Use   Smoking status: Never  Smokeless tobacco: Never  Vaping Use   Vaping Use: Never used  Substance and Sexual Activity   Alcohol use: Yes    Comment: occasional drink on the weekend.   Drug use: No   Sexual activity: Yes    Birth control/protection: None  Other Topics Concern   Not on file  Social History Narrative   Lives in [place].  Exercises - wt training - 4+ x /wk.     Social Determinants of Health   Financial Resource Strain: Not on file  Food Insecurity: Not on file  Transportation  Needs: Not on file  Physical Activity: Not on file  Stress: Not on file  Social Connections: Not on file  Intimate Partner Violence: Not on file    Family History  Problem Relation Age of Onset   Breast cancer Mother 72       triple negative, BRIP1+   Hyperlipidemia Father    Skin cancer Maternal Aunt    Heart disease Maternal Grandmother    Stroke Maternal Grandmother      Current Outpatient Medications:    acetaminophen (TYLENOL) 325 MG tablet, Take 2 tablets (650 mg total) by mouth every 4 (four) hours as needed (for pain scale < 4)., Disp: , Rfl:    albuterol (VENTOLIN HFA) 108 (90 Base) MCG/ACT inhaler, INHALE 1 PUFF BY MOUTH EVERY 4 HOURS AS NEEDED FOR WHEEZING OR SHORTNESS OF BREATH., Disp: 18 g, Rfl: 2   beclomethasone (QVAR REDIHALER) 40 MCG/ACT inhaler, INHALE 2 PUFFS INTO THE LUNGS TWO TIMES DAILY, Disp: 10.6 g, Rfl: 2   Clobetasol Propionate 0.05 % shampoo, Apply thin film to dry scalp once daily (maximum dose: 50 g/week or 50 mL/week); leave in place for 15 minutes, then add water, lather, and rinse thoroughly. Limit treatment to 4 consecutive weeks., Disp: 118 mL, Rfl: 0   ibuprofen (ADVIL) 600 MG tablet, Take 1 tablet (600 mg total) by mouth every 6 (six) hours as needed for pain., Disp: 20 tablet, Rfl: 0   levothyroxine (SYNTHROID) 25 MCG tablet, Take 1 tablet (25 mcg total) by mouth daily. Take on an empty stomach with a glass of water at least 30-60 minutes before breakfast., Disp: 90 tablet, Rfl: 1   omega-3 acid ethyl esters (LOVAZA) 1 g capsule, Take by mouth 2 (two) times daily., Disp: , Rfl:    Prenatal MV-Min-Fe Fum-FA-DHA (PRENATAL 1 PO), Take by mouth., Disp: , Rfl:    senna-docusate (SENOKOT-S) 8.6-50 MG tablet, Take 2 tablets by mouth daily., Disp: , Rfl:    sertraline (ZOLOFT) 50 MG tablet, Take 0.5 tablets (25 mg total) by mouth daily for 14 days, THEN 1 tablet (50 mg total) daily., Disp: 90 tablet, Rfl: 1   Sodium Oxybate (XYREM PO), Take 3.75 g by mouth 2  (two) times daily. Taking at night., Disp: , Rfl:   Physical exam:  Vitals:   06/27/22 1011  BP: 115/77  Pulse: 94  Resp: 16  Temp: 98.8 F (37.1 C)  TempSrc: Tympanic  SpO2: 100%  Weight: 144 lb (65.3 kg)  Height: 5\' 6"  (1.676 m)   Physical Exam Cardiovascular:     Rate and Rhythm: Normal rate and regular rhythm.     Heart sounds: Normal heart sounds.  Pulmonary:     Effort: Pulmonary effort is normal.     Breath sounds: Normal breath sounds.  Skin:    General: Skin is warm and dry.  Neurological:     Mental Status: She is alert and oriented to  person, place, and time.   Breast exam: No palpable masses in either breast.  No palpable bilateral axillary adenopathy.     Latest Ref Rng & Units 12/19/2021    9:09 AM  CMP  Glucose 70 - 99 mg/dL 99   BUN 6 - 23 mg/dL 16   Creatinine 1.61 - 1.20 mg/dL 0.96   Sodium 045 - 409 mEq/L 140   Potassium 3.5 - 5.1 mEq/L 4.0   Chloride 96 - 112 mEq/L 102   CO2 19 - 32 mEq/L 30   Calcium 8.4 - 10.5 mg/dL 9.4   Total Protein 6.0 - 8.3 g/dL 7.1   Total Bilirubin 0.2 - 1.2 mg/dL 0.5   Alkaline Phos 39 - 117 U/L 79   AST 0 - 37 U/L 14   ALT 0 - 35 U/L 13       Latest Ref Rng & Units 12/19/2021    9:09 AM  CBC  WBC 4.0 - 10.5 K/uL 4.7   Hemoglobin 12.0 - 15.0 g/dL 81.1   Hematocrit 91.4 - 46.0 % 41.2   Platelets 150.0 - 400.0 K/uL 159.0      Assessment and plan- Patient is a 37 y.o. female with a pathogenic BRIP1 gene mutation here for routine annual checkup  Clinically patient is doing well with no concerning palpable masses or palpable adenopathy in either breast.  She had a screening mammogram in February 2024 which showed calcifications in her bilateral breast.  This was followed by diagnostic mammogram as well as bilateral breast biopsiesWhich was consistent with sclerosing adenosis but no evidence of atypia or malignancy.  There was no complex sclerosing adenosis noted.  Sclerosing adenosis: First 2 times the risk of  breast cancer as compared to general population.She also has a history of BRIP1 gene mutation which also confers an increased lifetime risk of breast cancer.  The risk with BRIP1 gene mutation is higher for ovarian cancer and risk reduction oophorectomy is recommended after childbearing is completed.  Can risk model predicted her lifetime risk of developing breast cancer between 19.5 to 20.8%. At this time given that her breast tissue is extremely dense category D coupled with sclerosing adenosis noted on breast biopsy and history of BRIP1 gene mutation, I would recommend mammograms alternating with MRIs if her insurance approves.  She will be delivering her second child in November 2024.  Will plan to get bilateral breast MRI in December 2024.  As such bilateral mastectomy is not recommended and have discussed with her in detail.  I will see her back in 1 year for a routine breast check   Visit Diagnosis 1. At high risk for breast cancer   2. Monoallelic mutation of BRIP1 gene      Dr. Owens Shark, MD, MPH [facility] at [facility] [phone] 06/27/2022 1:30 PM

## 2022-06-29 ENCOUNTER — Encounter: Payer: Self-pay | Admitting: Cardiology

## 2022-06-29 ENCOUNTER — Ambulatory Visit (INDEPENDENT_AMBULATORY_CARE_PROVIDER_SITE_OTHER): Payer: 59

## 2022-06-29 ENCOUNTER — Ambulatory Visit (INDEPENDENT_AMBULATORY_CARE_PROVIDER_SITE_OTHER): Payer: 59 | Admitting: Cardiology

## 2022-06-29 ENCOUNTER — Telehealth: Payer: Self-pay

## 2022-06-29 VITALS — BP 113/72 | HR 74 | Ht 66.0 in | Wt 145.3 lb

## 2022-06-29 DIAGNOSIS — O09529 Supervision of elderly multigravida, unspecified trimester: Secondary | ICD-10-CM

## 2022-06-29 DIAGNOSIS — I493 Ventricular premature depolarization: Secondary | ICD-10-CM

## 2022-06-29 DIAGNOSIS — R002 Palpitations: Secondary | ICD-10-CM

## 2022-06-29 DIAGNOSIS — Z8759 Personal history of other complications of pregnancy, childbirth and the puerperium: Secondary | ICD-10-CM

## 2022-06-29 NOTE — Telephone Encounter (Signed)
Error

## 2022-06-29 NOTE — Progress Notes (Signed)
Cardio-Obstetrics Clinic  New Evaluation  Date:  06/29/2022   ID:  Tanya Glenn, DOB 02-13-1986, MRN [id number]  PCP:  Glori Luis, MD   [facility] Providers Cardiologist:  None  Electrophysiologist:  None       Referring MD: Glori Luis, MD   Chief Complaint: " I am having more pvcs"  History of Present Illness:    Tanya Glenn is a 37 y.o. female [G2P1101] who is being seen today for the evaluation of PVC at the request of Glori Luis, MD.   Medical history includes narcolepsy, symptomatic PVCs, Asthma, presents for evaluations during pregnancy for increasing palpitations. She is currently 12 weeks.   She tells me that she notices these palpitations off and on more frequently. With know hx of pvc she decided to come get evaluated.   Prior CV Studies Reviewed: The following studies were reviewed today: TTE 12-Jul-2016 Study Conclusions   - Left ventricle: The cavity size was normal. Wall thickness was    normal. Systolic function was normal. The estimated ejection    fraction was in the range of 60% to 65%. Wall motion was normal;    there were no regional wall motion abnormalities. Left    ventricular diastolic function parameters were normal.  - Right ventricle: The cavity size was normal. Wall thickness was    normal.  - Tricuspid valve: There was mild regurgitation.   -------------------------------------------------------------------  Labs, prior tests, procedures, and surgery:  ECG.     Abnormal.   -------------------------------------------------------------------  Study data:  No prior study was available for comparison.  Study  status:  Routine.  Procedure:  Transthoracic echocardiography.  Image quality was good.  Study completion:  There were no  complications.          Transthoracic echocardiography.  M-mode,  complete 2D, spectral Doppler, and color Doppler.  Birthdate:  Patient birthdate: 09-30-1985.  Age:  Patient is 37 yr  old.  Sex:  Gender: female.    BMI: 21.6 kg/m^2.  Blood pressure:     104/72  Patient status:  Outpatient.  Study date:  Study date: 10/10/2016.  Study time: 03:41 PM.   -------------------------------------------------------------------   -------------------------------------------------------------------  Left ventricle:  The cavity size was normal. Wall thickness was  normal. Systolic function was normal. The estimated ejection  fraction was in the range of 60% to 65%. Wall motion was normal;  there were no regional wall motion abnormalities. Left ventricular  diastolic function parameters were normal.   -------------------------------------------------------------------  Aortic valve:   Structurally normal valve.   Cusp separation was  normal.  Doppler:  Transvalvular velocity was within the normal  range. There was no stenosis. There was no regurgitation.    VTI  ratio of LVOT to aortic valve: 0.82. Valve area (VTI): 1.87 cm^2.  Indexed valve area (VTI): 1.11 cm^2/m^2. Mean velocity ratio of  LVOT to aortic valve: 0.72. Valve area (Vmean): 1.64 cm^2. Indexed  valve area (Vmean): 0.97 cm^2/m^2.    Mean gradient (S): 4 mm Hg.    -------------------------------------------------------------------  Aorta: Aortic root: The aortic root was normal in size.   -------------------------------------------------------------------  Mitral valve:   Structurally normal valve.   Leaflet separation was  normal.  Doppler:  Transvalvular velocity was within the normal  range. There was no evidence for stenosis. There was trivial  regurgitation.    Valve area by pressure half-time: 6.11 cm^2.  Indexed valve area by pressure half-time: 3.64 cm^2/m^2.    Peak  gradient (  D): 4 mm Hg.   -------------------------------------------------------------------  Left atrium:  The atrium was normal in size.   -------------------------------------------------------------------  Right ventricle:  The cavity  size was normal. Wall thickness was  normal. Systolic function was normal.   -------------------------------------------------------------------  Pulmonic valve:   Poorly visualized.  Doppler:  Transvalvular  velocity was within the normal range. There was no evidence for  stenosis. There was no significant regurgitation.   -------------------------------------------------------------------  Tricuspid valve:   Structurally normal valve.   Leaflet separation  was normal.  Doppler:  Transvalvular velocity was within the normal  range. There was mild regurgitation.   -------------------------------------------------------------------  Pulmonary artery:   Poorly visualized. Pulmonary artery pressure is  probably normal; RVSP 20-25 mmHg plus central venous pressure.   -------------------------------------------------------------------  Right atrium:  The atrium was normal in size.   -------------------------------------------------------------------  Pericardium: There was no pericardial effusion.   -------------------------------------------------------------------  Systemic veins:  Inferior vena cava: Not well visualized.   -------------------------------------------------------------------   Past Medical History:  Diagnosis Date   Anxiety    Asthma    Asthma    Depression    Family history of breast cancer    Family history of gene mutation    Narcolepsy    Symptomatic PVCs     Past Surgical History:  Procedure Laterality Date   BREAST BIOPSY Right 04/25/2022   Right Breast Stereo Bx, X clip path pending   BREAST BIOPSY Left 04/25/2022   Left Breast Stereo Bx, Coil clip - path pending   BREAST BIOPSY Right 04/25/2022   MM RT BREAST BX W LOC DEV 1ST LESION IMAGE BX SPEC STEREO GUIDE 04/25/2022 [facility]   BREAST BIOPSY Left 04/25/2022   MM LT BREAST BX W LOC DEV 1ST LESION IMAGE BX SPEC STEREO GUIDE 04/25/2022 [facility]   WISDOM TOOTH EXTRACTION        OB  History     Gravida  2   Para  2   Term  1   Preterm  1   AB  0   Living  1      SAB  0   IAB  0   Ectopic  0   Multiple  0   Live Births  1               Current Medications: Current Meds  Medication Sig   acetaminophen (TYLENOL) 325 MG tablet Take 2 tablets (650 mg total) by mouth every 4 (four) hours as needed (for pain scale < 4).   beclomethasone (QVAR REDIHALER) 40 MCG/ACT inhaler INHALE 2 PUFFS INTO THE LUNGS TWO TIMES DAILY   Clobetasol Propionate 0.05 % shampoo Apply thin film to dry scalp once daily (maximum dose: 50 g/week or 50 mL/week); leave in place for 15 minutes, then add water, lather, and rinse thoroughly. Limit treatment to 4 consecutive weeks.   levothyroxine (SYNTHROID) 25 MCG tablet Take 1 tablet (25 mcg total) by mouth daily. Take on an empty stomach with a glass of water at least 30-60 minutes before breakfast.   omega-3 acid ethyl esters (LOVAZA) 1 g capsule Take by mouth 2 (two) times daily.   Prenatal MV-Min-Fe Fum-FA-DHA (PRENATAL 1 PO) Take by mouth.   senna-docusate (SENOKOT-S) 8.6-50 MG tablet Take 2 tablets by mouth daily.   Sodium Oxybate (XYREM PO) Take 3.75 g by mouth 2 (two) times daily. Taking at night.     Allergies:   Sulfa antibiotics   Social History   Socioeconomic History   Marital  status: Married    Spouse name: Trish Mage   Number of children: Not on file   Years of education: Not on file   Highest education level: Not on file  Occupational History   Occupation: Pharmacist  Tobacco Use   Smoking status: Never   Smokeless tobacco: Never  Vaping Use   Vaping Use: Never used  Substance and Sexual Activity   Alcohol use: Yes    Comment: occasional drink on the weekend.   Drug use: No   Sexual activity: Yes    Birth control/protection: None  Other Topics Concern   Not on file  Social History Narrative   Lives in [place].  Exercises - wt training - 4+ x /wk.     Social Determinants of Health    Financial Resource Strain: Not on file  Food Insecurity: Not on file  Transportation Needs: Not on file  Physical Activity: Not on file  Stress: Not on file  Social Connections: Not on file      Family History  Problem Relation Age of Onset   Breast cancer Mother 60       triple negative, BRIP1+   Hyperlipidemia Father    Skin cancer Maternal Aunt    Heart disease Maternal Grandmother    Stroke Maternal Grandmother       ROS:   Please see the history of present illness.    Palpiations  All other systems reviewed and are negative.   Labs/EKG Reviewed:    EKG:   EKG is was not ordered today.   Recent Labs: 12/19/2021: ALT 13; BUN 16; Creatinine, Ser 0.98; Hemoglobin 13.7; Platelets 159.0; Potassium 4.0; Sodium 140   Recent Lipid Panel Lab Results  Component Value Date/Time   CHOL 172 11/08/2021 04:03 PM   CHOL 138 01/05/2015 02:52 PM   TRIG 46.0 11/08/2021 04:03 PM   HDL 93.00 11/08/2021 04:03 PM   HDL 63 01/05/2015 02:52 PM   CHOLHDL 2 11/08/2021 04:03 PM   LDLCALC 69 11/08/2021 04:03 PM   LDLCALC 62 12/21/2016 04:02 PM    Physical Exam:    VS:  BP 113/72   Pulse 74   Ht 5\' 6"  (1.676 m)   Wt 145 lb 4.8 oz (65.9 kg)   SpO2 100%   BMI 23.45 kg/m     Wt Readings from Last 3 Encounters:  06/29/22 145 lb 4.8 oz (65.9 kg)  06/27/22 144 lb (65.3 kg)  02/22/22 131 lb 3.2 oz (59.5 kg)     GEN:  Well nourished, well developed in no acute distress HEENT: Normal NECK: No JVD; No carotid bruits LYMPHATICS: No lymphadenopathy CARDIAC: RRR, no murmurs, rubs, gallops RESPIRATORY:  Clear to auscultation without rales, wheezing or rhonchi  ABDOMEN: Soft, non-tender, non-distended MUSCULOSKELETAL:  No edema; No deformity  SKIN: Warm and dry NEUROLOGIC:  Alert and oriented x 3 PSYCHIATRIC:  Normal affect    Risk Assessment/Risk Calculators:     CARPREG II Risk Prediction Index Score:  1.  The patient's risk for a primary cardiac event is 5%.             ASSESSMENT & PLAN:    PVC  Palpitations It will be beneficial to place a monitor on the patient to understand the PVC burden. For now will encourage cutting back on caffeine. Further recommendation once results are available.   Patient Instructions  Medication Instructions:  Your physician recommends that you continue on your current medications as directed. Please refer to the Current Medication  list given to you today.  *If you need a refill on your cardiac medications before your next appointment, please call your pharmacy*   Lab Work: None   Testing/Procedures: Christena Deem- Long Term Monitor Instructions  Your physician has requested you wear a ZIO patch monitor for 14 days.  This is a single patch monitor. [employer] supplies one patch monitor per enrollment. Additional stickers are not available. Please do not apply patch if you will be having a Nuclear Stress Test,  Echocardiogram, Cardiac CT, MRI, or Chest Xray during the period you would be wearing the  monitor. The patch cannot be worn during these tests. You cannot remove and re-apply the  ZIO XT patch monitor.  Your ZIO patch monitor will be mailed 3 day USPS to your address on file. It may take 3-5 days  to receive your monitor after you have been enrolled.  Once you have received your monitor, please review the enclosed instructions. Your monitor  has already been registered assigning a specific monitor serial # to you.  Billing and Patient Assistance Program Information  We have supplied [employer] with any of your insurance information on file for billing purposes. [employer] offers a sliding scale Patient Assistance Program for patients that do not have  insurance, or whose insurance does not completely cover the cost of the ZIO monitor.  You must apply for the Patient Assistance Program to qualify for this discounted rate.  To apply, please call [employer] at [phone], select option 4, select option 2, ask to apply  for  Patient Assistance Program. Meredeth Ide will ask your household income, and how many people  are in your household. They will quote your out-of-pocket cost based on that information.  [employer] will also be able to set up a 25-month, interest-free payment plan if needed.   Do not shower for the first 24 hours. You may shower after the first 24 hours.  Press the button if you feel a symptom. You will hear a small click. Record Date, Time and  Symptom in the Patient Logbook.  When you are ready to remove the patch, follow instructions on the last 2 pages of Patient  Logbook. Stick patch monitor onto the last page of Patient Logbook.  Place Patient Logbook in the blue and white box. Use locking tab on box and tape box closed  securely. The blue and white box has prepaid postage on it. Please place it in the mailbox as  soon as possible. Your physician should have your test results approximately 7 days after the  monitor has been mailed back to [facility].  Call [facility] Customer Care at [phone] if you have questions regarding  your ZIO XT patch monitor. Call them immediately if you see an orange light blinking on your  monitor.  If your monitor falls off in less than 4 days, contact our Monitor department at [phone].  If your monitor becomes loose or falls off after 4 days call [employer] at [phone] for  suggestions on securing your monitor    Follow-Up: At [facility], you and your health needs are our priority.  As part of our continuing mission to provide you with exceptional heart care, we have created designated Provider Care Teams.  These Care Teams include your primary Cardiologist (physician) and Advanced Practice Providers (APPs -  Physician Assistants and Nurse Practitioners) who all work together to provide you with the care you need, when you need it.  We recommend signing up for the patient  portal called "MyChart".  Sign up information is  provided on this After Visit Summary.  MyChart is used to connect with patients for Virtual Visits (Telemedicine).  Patients are able to view lab/test results, encounter notes, upcoming appointments, etc.  Non-urgent messages can be sent to your provider as well.   To learn more about what you can do with MyChart, go to [email or web address].    Your next appointment:   12 week(s)  Provider:   Thomasene Ripple, DO    Dispo:  No follow-ups on file.   Medication Adjustments/Labs and Tests Ordered: Current medicines are reviewed at length with the patient today.  Concerns regarding medicines are outlined above.  Tests Ordered: Orders Placed This Encounter  Procedures   LONG TERM MONITOR (3-14 DAYS)   Medication Changes: No orders of the defined types were placed in this encounter.

## 2022-06-29 NOTE — Telephone Encounter (Signed)
Pt called be cause her monitor would not connect to the app. Monitor is expired. Will apply new monitor to pt Monday.

## 2022-06-29 NOTE — Patient Instructions (Signed)
Medication Instructions:  Your physician recommends that you continue on your current medications as directed. Please refer to the Current Medication list given to you today.  *If you need a refill on your cardiac medications before your next appointment, please call your pharmacy*   Lab Work: None   Testing/Procedures: Christena Deem- Long Term Monitor Instructions  Your physician has requested you wear a ZIO patch monitor for 14 days.  This is a single patch monitor. [employer] supplies one patch monitor per enrollment. Additional stickers are not available. Please do not apply patch if you will be having a Nuclear Stress Test,  Echocardiogram, Cardiac CT, MRI, or Chest Xray during the period you would be wearing the  monitor. The patch cannot be worn during these tests. You cannot remove and re-apply the  ZIO XT patch monitor.  Your ZIO patch monitor will be mailed 3 day USPS to your address on file. It may take 3-5 days  to receive your monitor after you have been enrolled.  Once you have received your monitor, please review the enclosed instructions. Your monitor  has already been registered assigning a specific monitor serial # to you.  Billing and Patient Assistance Program Information  We have supplied [employer] with any of your insurance information on file for billing purposes. [employer] offers a sliding scale Patient Assistance Program for patients that do not have  insurance, or whose insurance does not completely cover the cost of the ZIO monitor.  You must apply for the Patient Assistance Program to qualify for this discounted rate.  To apply, please call [employer] at [phone], select option 4, select option 2, ask to apply for  Patient Assistance Program. Meredeth Ide will ask your household income, and how many people  are in your household. They will quote your out-of-pocket cost based on that information.  [employer] will also be able to set up a 28-month, interest-free payment plan if  needed.   Do not shower for the first 24 hours. You may shower after the first 24 hours.  Press the button if you feel a symptom. You will hear a small click. Record Date, Time and  Symptom in the Patient Logbook.  When you are ready to remove the patch, follow instructions on the last 2 pages of Patient  Logbook. Stick patch monitor onto the last page of Patient Logbook.  Place Patient Logbook in the blue and white box. Use locking tab on box and tape box closed  securely. The blue and white box has prepaid postage on it. Please place it in the mailbox as  soon as possible. Your physician should have your test results approximately 7 days after the  monitor has been mailed back to [facility].  Call [facility] Customer Care at [phone] if you have questions regarding  your ZIO XT patch monitor. Call them immediately if you see an orange light blinking on your  monitor.  If your monitor falls off in less than 4 days, contact our Monitor department at [phone].  If your monitor becomes loose or falls off after 4 days call [employer] at [phone] for  suggestions on securing your monitor    Follow-Up: At [facility], you and your health needs are our priority.  As part of our continuing mission to provide you with exceptional heart care, we have created designated Provider Care Teams.  These Care Teams include your primary Cardiologist (physician) and Advanced Practice Providers (APPs -  Physician Assistants and Nurse Practitioners) who all work  together to provide you with the care you need, when you need it.  We recommend signing up for the patient portal called "MyChart".  Sign up information is provided on this After Visit Summary.  MyChart is used to connect with patients for Virtual Visits (Telemedicine).  Patients are able to view lab/test results, encounter notes, upcoming appointments, etc.  Non-urgent messages can be sent to your provider as well.   To  learn more about what you can do with MyChart, go to [email or web address].    Your next appointment:   12 week(s)  Provider:   Thomasene Ripple, DO

## 2022-07-02 DIAGNOSIS — I493 Ventricular premature depolarization: Secondary | ICD-10-CM

## 2022-07-11 ENCOUNTER — Other Ambulatory Visit: Payer: Self-pay

## 2022-07-19 DIAGNOSIS — I493 Ventricular premature depolarization: Secondary | ICD-10-CM | POA: Diagnosis not present

## 2022-07-25 DIAGNOSIS — E063 Autoimmune thyroiditis: Secondary | ICD-10-CM | POA: Diagnosis not present

## 2022-07-25 DIAGNOSIS — E038 Other specified hypothyroidism: Secondary | ICD-10-CM | POA: Diagnosis not present

## 2022-08-01 DIAGNOSIS — N9489 Other specified conditions associated with female genital organs and menstrual cycle: Secondary | ICD-10-CM | POA: Diagnosis not present

## 2022-08-01 DIAGNOSIS — O0992 Supervision of high risk pregnancy, unspecified, second trimester: Secondary | ICD-10-CM | POA: Diagnosis not present

## 2022-08-13 ENCOUNTER — Ambulatory Visit: Payer: Commercial Managed Care - PPO | Admitting: Family Medicine

## 2022-08-14 DIAGNOSIS — O0992 Supervision of high risk pregnancy, unspecified, second trimester: Secondary | ICD-10-CM | POA: Diagnosis not present

## 2022-08-16 DIAGNOSIS — F4320 Adjustment disorder, unspecified: Secondary | ICD-10-CM | POA: Diagnosis not present

## 2022-08-22 DIAGNOSIS — E038 Other specified hypothyroidism: Secondary | ICD-10-CM | POA: Diagnosis not present

## 2022-08-22 DIAGNOSIS — E079 Disorder of thyroid, unspecified: Secondary | ICD-10-CM | POA: Diagnosis not present

## 2022-08-22 DIAGNOSIS — E063 Autoimmune thyroiditis: Secondary | ICD-10-CM | POA: Diagnosis not present

## 2022-08-22 DIAGNOSIS — O0991 Supervision of high risk pregnancy, unspecified, first trimester: Secondary | ICD-10-CM | POA: Diagnosis not present

## 2022-08-26 ENCOUNTER — Other Ambulatory Visit: Payer: Self-pay | Admitting: Family Medicine

## 2022-08-27 ENCOUNTER — Other Ambulatory Visit: Payer: Self-pay

## 2022-08-27 ENCOUNTER — Other Ambulatory Visit: Payer: Self-pay | Admitting: Family Medicine

## 2022-08-27 DIAGNOSIS — E063 Autoimmune thyroiditis: Secondary | ICD-10-CM | POA: Diagnosis not present

## 2022-08-27 DIAGNOSIS — E079 Disorder of thyroid, unspecified: Secondary | ICD-10-CM | POA: Diagnosis not present

## 2022-08-27 DIAGNOSIS — E038 Other specified hypothyroidism: Secondary | ICD-10-CM | POA: Diagnosis not present

## 2022-08-28 ENCOUNTER — Other Ambulatory Visit: Payer: Self-pay

## 2022-08-28 MED FILL — Albuterol Sulfate Inhal Aero 108 MCG/ACT (90MCG Base Equiv): RESPIRATORY_TRACT | 30 days supply | Qty: 6.7 | Fill #0 | Status: AC

## 2022-08-30 DIAGNOSIS — F4320 Adjustment disorder, unspecified: Secondary | ICD-10-CM | POA: Diagnosis not present

## 2022-09-13 DIAGNOSIS — F4320 Adjustment disorder, unspecified: Secondary | ICD-10-CM | POA: Diagnosis not present

## 2022-09-20 ENCOUNTER — Other Ambulatory Visit: Payer: Self-pay | Admitting: Oncology

## 2022-09-20 DIAGNOSIS — Z006 Encounter for examination for normal comparison and control in clinical research program: Secondary | ICD-10-CM

## 2022-09-27 DIAGNOSIS — E038 Other specified hypothyroidism: Secondary | ICD-10-CM | POA: Diagnosis not present

## 2022-09-27 DIAGNOSIS — O99282 Endocrine, nutritional and metabolic diseases complicating pregnancy, second trimester: Secondary | ICD-10-CM | POA: Diagnosis not present

## 2022-09-27 DIAGNOSIS — E079 Disorder of thyroid, unspecified: Secondary | ICD-10-CM | POA: Diagnosis not present

## 2022-09-27 DIAGNOSIS — E063 Autoimmune thyroiditis: Secondary | ICD-10-CM | POA: Diagnosis not present

## 2022-09-28 ENCOUNTER — Encounter: Payer: Self-pay | Admitting: Cardiology

## 2022-09-28 ENCOUNTER — Ambulatory Visit (INDEPENDENT_AMBULATORY_CARE_PROVIDER_SITE_OTHER): Payer: 59 | Admitting: Cardiology

## 2022-09-28 VITALS — BP 114/84 | HR 91 | Ht 66.5 in | Wt 165.6 lb

## 2022-09-28 DIAGNOSIS — O09529 Supervision of elderly multigravida, unspecified trimester: Secondary | ICD-10-CM | POA: Diagnosis not present

## 2022-09-28 DIAGNOSIS — Z3A24 24 weeks gestation of pregnancy: Secondary | ICD-10-CM | POA: Diagnosis not present

## 2022-09-28 DIAGNOSIS — I493 Ventricular premature depolarization: Secondary | ICD-10-CM | POA: Diagnosis not present

## 2022-09-28 NOTE — Patient Instructions (Addendum)
Medication Instructions:  Your physician recommends that you continue on your current medications as directed. Please refer to the Current Medication list given to you today.  *If you need a refill on your cardiac medications before your next appointment, please call your pharmacy*   Lab Work: None   Testing/Procedures: None   Follow-Up: At [facility], you and your health needs are our priority.  As part of our continuing mission to provide you with exceptional heart care, we have created designated Provider Care Teams.  These Care Teams include your primary Cardiologist (physician) and Advanced Practice Providers (APPs -  Physician Assistants and Nurse Practitioners) who all work together to provide you with the care you need, when you need it.  Your next appointment:   10 week(s)  Provider:   Thomasene Ripple, DO - [facility] - overbook  Other instructions: Please purchase blood pressure cuff: Ormon is the recommended brand.

## 2022-09-29 NOTE — Progress Notes (Signed)
Cardio-Obstetrics Clinic  New Evaluation  Date:  09/29/2022   ID:  Bexleigh Dehnel, DOB 04-Jun-1985, MRN [id number]  PCP:  Glori Luis, MD   [facility] Providers Cardiologist:  Thomasene Ripple, DO  Electrophysiologist:  None       Referring MD: Glori Luis, MD   Chief Complaint: " I am having more pvcs"  History of Present Illness:    Trinidy Kernell is a 37 y.o. female [G3P1101] who is being seen today for the evaluation of PVC at the request of Glori Luis, MD.   Medical history includes narcolepsy, symptomatic PVCs, Asthma,.  At her last visit we talked about the PVCs and placed a monitor on the patient because she was experiencing some palpitations. Since I saw the patient she has worn the monitor which was normal.   She has not had worsening palpitations she is happy about this.  No complaints today. Prior CV Studies Reviewed: The following studies were reviewed today:   Zio  monitor 07/20/2022 Patch Wear Time:  13 days and 14 hours (2024-05-13T15:25:48-0400 to 2024-05-27T05:44:17-0400)   Patient had a min HR of 51 bpm, max HR of 153 bpm, and avg HR of 84 bpm. Predominant underlying rhythm was Sinus Rhythm. Isolated SVEs were rare (<1.0%), SVE Couplets were rare (<1.0%), and no SVE Triplets were present. Isolated VEs were rare (<1.0%), VE  Couplets were rare (<1.0%), and no VE Triplets were present.   Conclusion: Normal unremarkable study.   TTE 2018 Study Conclusions   - Left ventricle: The cavity size was normal. Wall thickness was    normal. Systolic function was normal. The estimated ejection    fraction was in the range of 60% to 65%. Wall motion was normal;    there were no regional wall motion abnormalities. Left    ventricular diastolic function parameters were normal.  - Right ventricle: The cavity size was normal. Wall thickness was    normal.  - Tricuspid valve: There was mild regurgitation.    -------------------------------------------------------------------  Labs, prior tests, procedures, and surgery:  ECG.     Abnormal.   -------------------------------------------------------------------  Study data:  No prior study was available for comparison.  Study  status:  Routine.  Procedure:  Transthoracic echocardiography.  Image quality was good.  Study completion:  There were no  complications.          Transthoracic echocardiography.  M-mode,  complete 2D, spectral Doppler, and color Doppler.  Birthdate:  Patient birthdate: 1985/04/30.  Age:  Patient is 37 yr old.  Sex:  Gender: female.    BMI: 21.6 kg/m^2.  Blood pressure:     104/72  Patient status:  Outpatient.  Study date:  Study date: 10/10/2016.  Study time: 03:41 PM.   -------------------------------------------------------------------   -------------------------------------------------------------------  Left ventricle:  The cavity size was normal. Wall thickness was  normal. Systolic function was normal. The estimated ejection  fraction was in the range of 60% to 65%. Wall motion was normal;  there were no regional wall motion abnormalities. Left ventricular  diastolic function parameters were normal.   -------------------------------------------------------------------  Aortic valve:   Structurally normal valve.   Cusp separation was  normal.  Doppler:  Transvalvular velocity was within the normal  range. There was no stenosis. There was no regurgitation.    VTI  ratio of LVOT to aortic valve: 0.82. Valve area (VTI): 1.87 cm^2.  Indexed valve area (VTI): 1.11 cm^2/m^2. Mean velocity ratio of  LVOT to aortic valve: 0.72. Valve  area (Vmean): 1.64 cm^2. Indexed  valve area (Vmean): 0.97 cm^2/m^2.    Mean gradient (S): 4 mm Hg.    -------------------------------------------------------------------  Aorta: Aortic root: The aortic root was normal in size.    -------------------------------------------------------------------  Mitral valve:   Structurally normal valve.   Leaflet separation was  normal.  Doppler:  Transvalvular velocity was within the normal  range. There was no evidence for stenosis. There was trivial  regurgitation.    Valve area by pressure half-time: 6.11 cm^2.  Indexed valve area by pressure half-time: 3.64 cm^2/m^2.    Peak  gradient (D): 4 mm Hg.   -------------------------------------------------------------------  Left atrium:  The atrium was normal in size.   -------------------------------------------------------------------  Right ventricle:  The cavity size was normal. Wall thickness was  normal. Systolic function was normal.   -------------------------------------------------------------------  Pulmonic valve:   Poorly visualized.  Doppler:  Transvalvular  velocity was within the normal range. There was no evidence for  stenosis. There was no significant regurgitation.   -------------------------------------------------------------------  Tricuspid valve:   Structurally normal valve.   Leaflet separation  was normal.  Doppler:  Transvalvular velocity was within the normal  range. There was mild regurgitation.   -------------------------------------------------------------------  Pulmonary artery:   Poorly visualized. Pulmonary artery pressure is  probably normal; RVSP 20-25 mmHg plus central venous pressure.   -------------------------------------------------------------------  Right atrium:  The atrium was normal in size.   -------------------------------------------------------------------  Pericardium: There was no pericardial effusion.   -------------------------------------------------------------------  Systemic veins:  Inferior vena cava: Not well visualized.   -------------------------------------------------------------------   Past Medical History:  Diagnosis Date   Anxiety    Asthma     Asthma    Depression    Family history of breast cancer    Family history of gene mutation    Narcolepsy    Symptomatic PVCs     Past Surgical History:  Procedure Laterality Date   BREAST BIOPSY Right 04/25/2022   Right Breast Stereo Bx, X clip path pending   BREAST BIOPSY Left 04/25/2022   Left Breast Stereo Bx, Coil clip - path pending   BREAST BIOPSY Right 04/25/2022   MM RT BREAST BX W LOC DEV 1ST LESION IMAGE BX SPEC STEREO GUIDE 04/25/2022 [facility]   BREAST BIOPSY Left 04/25/2022   MM LT BREAST BX W LOC DEV 1ST LESION IMAGE BX SPEC STEREO GUIDE 04/25/2022 [facility]   WISDOM TOOTH EXTRACTION        OB History     Gravida  3   Para  2   Term  1   Preterm  1   AB  0   Living  1      SAB  0   IAB  0   Ectopic  0   Multiple  0   Live Births  1               Current Medications: Current Meds  Medication Sig   acetaminophen (TYLENOL) 325 MG tablet Take 2 tablets (650 mg total) by mouth every 4 (four) hours as needed (for pain scale < 4).   albuterol (VENTOLIN HFA) 108 (90 Base) MCG/ACT inhaler Inhale 1 puff into the lungs every 4 (four) hours as needed for wheezing or shortness of breath   beclomethasone (QVAR REDIHALER) 40 MCG/ACT inhaler INHALE 2 PUFFS INTO THE LUNGS TWO TIMES DAILY   Clobetasol Propionate 0.05 % shampoo Apply thin film to dry scalp once daily (maximum dose: 50 g/week or 50 mL/week); leave in  place for 15 minutes, then add water, lather, and rinse thoroughly. Limit treatment to 4 consecutive weeks.   levothyroxine (SYNTHROID) 25 MCG tablet Take 1 tablet (25 mcg total) by mouth daily. Take on an empty stomach with a glass of water at least 30-60 minutes before breakfast.   omega-3 acid ethyl esters (LOVAZA) 1 g capsule Take by mouth 2 (two) times daily.   Prenatal MV-Min-Fe Fum-FA-DHA (PRENATAL 1 PO) Take by mouth.   senna-docusate (SENOKOT-S) 8.6-50 MG tablet Take 2 tablets by mouth daily.     Allergies:   Sulfa antibiotics    Social History   Socioeconomic History   Marital status: Married    Spouse name: Trish Mage   Number of children: Not on file   Years of education: Not on file   Highest education level: Not on file  Occupational History   Occupation: Pharmacist  Tobacco Use   Smoking status: Never   Smokeless tobacco: Never  Vaping Use   Vaping status: Never Used  Substance and Sexual Activity   Alcohol use: Yes    Comment: occasional drink on the weekend.   Drug use: No   Sexual activity: Yes    Birth control/protection: None  Other Topics Concern   Not on file  Social History Narrative   Lives in [place].  Exercises - wt training - 4+ x /wk.     Social Determinants of Health   Financial Resource Strain: Not on file  Food Insecurity: Not on file  Transportation Needs: No Transportation Needs (04/24/2021)   Received from [facility], [facility]   PRAPARE - Transportation    Lack of Transportation (Medical): No    Lack of Transportation (Non-Medical): No  Physical Activity: Not on file  Stress: Not on file  Social Connections: Not on file      Family History  Problem Relation Age of Onset   Breast cancer Mother 55       triple negative, BRIP1+   Hyperlipidemia Father    Skin cancer Maternal Aunt    Heart disease Maternal Grandmother    Stroke Maternal Grandmother       ROS:   Please see the history of present illness.    Palpiations  All other systems reviewed and are negative.   Labs/EKG Reviewed:    EKG:   EKG is was not ordered today.   Recent Labs: 12/19/2021: ALT 13; BUN 16; Creatinine, Ser 0.98; Hemoglobin 13.7; Platelets 159.0; Potassium 4.0; Sodium 140   Recent Lipid Panel Lab Results  Component Value Date/Time   CHOL 172 11/08/2021 04:03 PM   CHOL 138 01/05/2015 02:52 PM   TRIG 46.0 11/08/2021 04:03 PM   HDL 93.00 11/08/2021 04:03 PM   HDL 63 01/05/2015 02:52 PM   CHOLHDL 2 11/08/2021 04:03 PM   LDLCALC 69  11/08/2021 04:03 PM   LDLCALC 62 12/21/2016 04:02 PM    Physical Exam:    VS:  BP 114/84   Pulse 91   Ht 5' 6.5" (1.689 m)   Wt 165 lb 9.6 oz (75.1 kg)   LMP 04/09/2022   SpO2 98%   BMI 26.33 kg/m     Wt Readings from Last 3 Encounters:  09/28/22 165 lb 9.6 oz (75.1 kg)  06/29/22 145 lb 4.8 oz (65.9 kg)  06/27/22 144 lb (65.3 kg)     GEN:  Well nourished, well developed in no acute distress HEENT: Normal NECK: No JVD; No carotid bruits LYMPHATICS: No lymphadenopathy  CARDIAC: RRR, no murmurs, rubs, gallops RESPIRATORY:  Clear to auscultation without rales, wheezing or rhonchi  ABDOMEN: Soft, non-tender, non-distended MUSCULOSKELETAL:  No edema; No deformity  SKIN: Warm and dry NEUROLOGIC:  Alert and oriented x 3 PSYCHIATRIC:  Normal affect    Risk Assessment/Risk Calculators:                  ASSESSMENT & PLAN:    PVC   Monitor normal, symptoms has improved with the palpitations.  Will monitor the patient closely. Advised the patient on taking blood pressure daily given third-trimester as well as also in the postpartum period.  Notify my office via MyChart if blood pressures greater than 140/90 mmHg.   Patient Instructions  Medication Instructions:  Your physician recommends that you continue on your current medications as directed. Please refer to the Current Medication list given to you today.  *If you need a refill on your cardiac medications before your next appointment, please call your pharmacy*   Lab Work: None   Testing/Procedures: None   Follow-Up: At [facility], you and your health needs are our priority.  As part of our continuing mission to provide you with exceptional heart care, we have created designated Provider Care Teams.  These Care Teams include your primary Cardiologist (physician) and Advanced Practice Providers (APPs -  Physician Assistants and Nurse Practitioners) who all work together to provide you with the care you  need, when you need it.  Your next appointment:   10 week(s)  Provider:   Thomasene Ripple, DO - [facility] - overbook  Other instructions: Please purchase blood pressure cuff: Ormon is the recommended brand.    Dispo:  No follow-ups on file.   Medication Adjustments/Labs and Tests Ordered: Current medicines are reviewed at length with the patient today.  Concerns regarding medicines are outlined above.  Tests Ordered: No orders of the defined types were placed in this encounter.  Medication Changes: No orders of the defined types were placed in this encounter.

## 2022-10-01 DIAGNOSIS — F4320 Adjustment disorder, unspecified: Secondary | ICD-10-CM | POA: Diagnosis not present

## 2022-10-10 DIAGNOSIS — G47411 Narcolepsy with cataplexy: Secondary | ICD-10-CM | POA: Diagnosis not present

## 2022-10-15 DIAGNOSIS — F4320 Adjustment disorder, unspecified: Secondary | ICD-10-CM | POA: Diagnosis not present

## 2022-10-23 DIAGNOSIS — O0992 Supervision of high risk pregnancy, unspecified, second trimester: Secondary | ICD-10-CM | POA: Diagnosis not present

## 2022-10-23 DIAGNOSIS — O09292 Supervision of pregnancy with other poor reproductive or obstetric history, second trimester: Secondary | ICD-10-CM | POA: Diagnosis not present

## 2022-10-24 DIAGNOSIS — E039 Hypothyroidism, unspecified: Secondary | ICD-10-CM | POA: Diagnosis not present

## 2022-10-24 DIAGNOSIS — O09522 Supervision of elderly multigravida, second trimester: Secondary | ICD-10-CM | POA: Diagnosis not present

## 2022-10-24 DIAGNOSIS — O99280 Endocrine, nutritional and metabolic diseases complicating pregnancy, unspecified trimester: Secondary | ICD-10-CM | POA: Diagnosis not present

## 2022-10-24 DIAGNOSIS — O09292 Supervision of pregnancy with other poor reproductive or obstetric history, second trimester: Secondary | ICD-10-CM | POA: Diagnosis not present

## 2022-10-24 DIAGNOSIS — Z23 Encounter for immunization: Secondary | ICD-10-CM | POA: Diagnosis not present

## 2022-10-31 DIAGNOSIS — O0993 Supervision of high risk pregnancy, unspecified, third trimester: Secondary | ICD-10-CM | POA: Diagnosis not present

## 2022-10-31 DIAGNOSIS — O09293 Supervision of pregnancy with other poor reproductive or obstetric history, third trimester: Secondary | ICD-10-CM | POA: Diagnosis not present

## 2022-10-31 DIAGNOSIS — Z3A29 29 weeks gestation of pregnancy: Secondary | ICD-10-CM | POA: Diagnosis not present

## 2022-11-01 DIAGNOSIS — R7309 Other abnormal glucose: Secondary | ICD-10-CM | POA: Diagnosis not present

## 2022-11-01 DIAGNOSIS — E063 Autoimmune thyroiditis: Secondary | ICD-10-CM | POA: Diagnosis not present

## 2022-11-01 DIAGNOSIS — O0993 Supervision of high risk pregnancy, unspecified, third trimester: Secondary | ICD-10-CM | POA: Diagnosis not present

## 2022-11-01 DIAGNOSIS — E038 Other specified hypothyroidism: Secondary | ICD-10-CM | POA: Diagnosis not present

## 2022-11-01 DIAGNOSIS — E079 Disorder of thyroid, unspecified: Secondary | ICD-10-CM | POA: Diagnosis not present

## 2022-11-01 DIAGNOSIS — O99281 Endocrine, nutritional and metabolic diseases complicating pregnancy, first trimester: Secondary | ICD-10-CM | POA: Diagnosis not present

## 2022-11-05 ENCOUNTER — Ambulatory Visit (INDEPENDENT_AMBULATORY_CARE_PROVIDER_SITE_OTHER): Payer: 59 | Admitting: Family Medicine

## 2022-11-05 ENCOUNTER — Encounter: Payer: Self-pay | Admitting: Family Medicine

## 2022-11-05 VITALS — BP 116/72 | HR 98 | Temp 98.1°F | Ht 66.5 in | Wt 170.4 lb

## 2022-11-05 DIAGNOSIS — Z130 Encounter for screening for diseases of the blood and blood-forming organs and certain disorders involving the immune mechanism: Secondary | ICD-10-CM

## 2022-11-05 DIAGNOSIS — Z1329 Encounter for screening for other suspected endocrine disorder: Secondary | ICD-10-CM

## 2022-11-05 DIAGNOSIS — I83891 Varicose veins of right lower extremities with other complications: Secondary | ICD-10-CM

## 2022-11-05 DIAGNOSIS — E038 Other specified hypothyroidism: Secondary | ICD-10-CM | POA: Diagnosis not present

## 2022-11-05 DIAGNOSIS — Z0001 Encounter for general adult medical examination with abnormal findings: Secondary | ICD-10-CM

## 2022-11-05 DIAGNOSIS — O99283 Endocrine, nutritional and metabolic diseases complicating pregnancy, third trimester: Secondary | ICD-10-CM | POA: Diagnosis not present

## 2022-11-05 DIAGNOSIS — Z13220 Encounter for screening for lipoid disorders: Secondary | ICD-10-CM | POA: Diagnosis not present

## 2022-11-05 DIAGNOSIS — E079 Disorder of thyroid, unspecified: Secondary | ICD-10-CM | POA: Diagnosis not present

## 2022-11-05 DIAGNOSIS — E063 Autoimmune thyroiditis: Secondary | ICD-10-CM | POA: Diagnosis not present

## 2022-11-05 NOTE — Assessment & Plan Note (Signed)
Chronic issue.  Discussed continued compression stockings during the day.  Advised not to wear compression stockings at night.  Discussed seeing vascular surgery if her symptoms do not improve after she has delivered her child.

## 2022-11-05 NOTE — Progress Notes (Signed)
Marikay Alar, MD Phone: [phone]  Tanya Glenn is a 37 y.o. female who presents today for CPE.  Diet: Does eat out a lot though eats healthy options, does get premade meals though they are appropriate portion sizes, no soda or sweet tea. Exercise: Not able to exercise much given that she is pregnant, she does walk some, notes any increased activity level causes contractions Pap smear: Through GYN Mammogram: Up-to-date, alternating every 6 months with mammogram given cancer risk Family history-  Colon cancer: no  Breast cancer: mother Ovarian cancer: no, though patient is at higher risk based on genetic screening and notes she has been advised to have oophorectomy at age 55. Menses: pregnant Vaccines-   Flu: through work  Tetanus: UTD  COVID19: has declined additional vaccines in the past  HIV screening: UTD Hep C Screening: UTD Tobacco use: no Alcohol use: no Illicit Drug use: no Dentist: yes Ophthalmology: yes  Varicose veins: Patient has a history of these.  She notes she had a procedure for this in her mid 86s.  She notes that have been bothering her more with her pregnancy.  She wears compression stockings during the day.  Notes she discussed with her obstetrician and they advised monitoring.   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   Thrombocytopenia (HCC) 08/15/2020   Encounter for general adult medical examination with abnormal findings 11/02/2020   Varicose veins of leg with swelling, 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   Hypothyroid in pregnancy, antepartum 12/19/2020   Family history of breast cancer 12/21/2020    Family history of gene mutation 12/21/2020   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   Nausea & vomiting 03/15/2021   Disorder of thyroid, unspecified 08/30/2020   IBS (irritable bowel syndrome) 11/08/2021   Pelvic pain 12/19/2021   Weight loss 12/19/2021   Chalazion 02/22/2022   Sinusitis 02/22/2022   Resolved Ambulatory Problems    Diagnosis Date Noted   Leg pain, right 09/26/2015   Palpitations 10/08/2016   Hypokalemia 10/08/2016   Lipid screening 04/29/2018   Pre-conception counseling 04/29/2018   Asthma exacerbation 05/23/2018   Vaginal candidiasis 10/31/2018   Dizziness 09/01/2019   Supervision of high risk pregnancy, antepartum 01/04/2020   Encounter for supervision of pregnancy resulting from assisted reproductive technology 01/04/2020   Antepartum multigravida of advanced maternal age 65/15/2021   Abdominal pain during pregnancy in third trimester 04/30/2020   Decreased fetal movement 05/02/2020   Fetal demise, greater than 22 weeks, antepartum 05/02/2020   [redacted] weeks gestation of pregnancy 05/04/2020   Prior pregnancy with fetal demise 12/19/2020   Genetic testing 01/04/2021   Second trimester bleeding 02/14/2021   Preterm uterine contractions in third trimester, antepartum 04/23/2021   Normal labor 06/25/2021   COVID-19 08/30/2020   Elevated blood pressure reading 04/25/2021   History of IUFD 01/17/2021   History of stillbirth in currently pregnant patient 01/02/2021   Pregnancy resulting from in vitro fertilization in second trimester 02/14/2021   Pregnancy resulting from in vitro fertilization, antepartum 01/17/2021   Threatened preterm labor, second trimester 04/27/2021   Past Medical History:  Diagnosis Date   Anxiety    Asthma    Asthma  Depression    Symptomatic PVCs     Family History  Problem Relation Age of Onset   Breast cancer Mother 71       triple negative, BRIP1+   Hyperlipidemia Father    Skin cancer Maternal Aunt    Heart disease  Maternal Grandmother    Stroke Maternal Grandmother     Social History   Socioeconomic History   Marital status: Married    Spouse name: Trish Mage   Number of children: Not on file   Years of education: Not on file   Highest education level: Professional school degree (e.g., MD, DDS, DVM, JD)  Occupational History   Occupation: Pharmacist  Tobacco Use   Smoking status: Never   Smokeless tobacco: Never  Vaping Use   Vaping status: Never Used  Substance and Sexual Activity   Alcohol use: Yes    Comment: occasional drink on the weekend.   Drug use: No   Sexual activity: Yes    Birth control/protection: None  Other Topics Concern   Not on file  Social History Narrative   Lives in [place].  Exercises - wt training - 4+ x /wk.     Social Determinants of Health   Financial Resource Strain: Low Risk  (11/01/2022)   Overall Financial Resource Strain (CARDIA)    Difficulty of Paying Living Expenses: Not hard at all  Food Insecurity: No Food Insecurity (11/01/2022)   Hunger Vital Sign    Worried About Running Out of Food in the Last Year: Never true    Ran Out of Food in the Last Year: Never true  Transportation Needs: No Transportation Needs (11/01/2022)   PRAPARE - Administrator, Civil Service (Medical): No    Lack of Transportation (Non-Medical): No  Physical Activity: Insufficiently Active (11/01/2022)   Exercise Vital Sign    Days of Exercise per Week: 2 days    Minutes of Exercise per Session: 30 min  Stress: Stress Concern Present (11/01/2022)   [employer] of Occupational Health - Occupational Stress Questionnaire    Feeling of Stress : To some extent  Social Connections: Socially Integrated (11/01/2022)   Social Connection and Isolation Panel [NHANES]    Frequency of Communication with Friends and Family: More than three times a week    Frequency of Social Gatherings with Friends and Family: Once a week    Attends Religious Services: More than 4 times  per year    Active Member of [employer] or Organizations: Yes    Attends Engineer, structural: More than 4 times per year    Marital Status: Married  Catering manager Violence: Not on file    ROS  General:  Negative for nexplained weight loss, fever Skin: Negative for new or changing mole, sore that won't heal HEENT: Negative for trouble hearing, trouble seeing, ringing in ears, mouth sores, hoarseness, change in voice, dysphagia. CV: Positive for edema (related to varicose veins), negative for chest pain, dyspnea, palpitations Resp: Negative for cough, dyspnea, hemoptysis GI: Negative for nausea, vomiting, diarrhea, constipation, abdominal pain, melena, hematochezia. GU: Negative for dysuria, incontinence, urinary hesitance, hematuria, vaginal or penile discharge, polyuria, sexual difficulty, lumps in testicle or breasts MSK: Negative for muscle cramps or aches, joint pain or swelling Neuro: Negative for headaches, weakness, numbness, dizziness, passing out/fainting Psych: Negative for depression, anxiety, memory problems  Objective  Physical Exam Vitals:   11/05/22 0925  BP: 116/72  Pulse: 98  Temp: 98.1 F (36.7 C)  SpO2: 99%  BP Readings from Last 3 Encounters:  11/05/22 116/72  09/28/22 114/84  06/29/22 113/72   Wt Readings from Last 3 Encounters:  11/05/22 170 lb 6.4 oz (77.3 kg)  09/28/22 165 lb 9.6 oz (75.1 kg)  06/29/22 145 lb 4.8 oz (65.9 kg)    Physical Exam Constitutional:      General: She is not in acute distress.    Appearance: She is not diaphoretic.  HENT:     Head: Normocephalic and atraumatic.  Cardiovascular:     Rate and Rhythm: Normal rate and regular rhythm.     Heart sounds: Normal heart sounds.  Pulmonary:     Effort: Pulmonary effort is normal.     Breath sounds: Normal breath sounds.  Musculoskeletal:     Comments: Varicose veins right lower extremity  Lymphadenopathy:     Cervical: No cervical adenopathy.  Skin:    General:  Skin is warm and dry.  Neurological:     Mental Status: She is alert.  Psychiatric:        Mood and Affect: Mood normal.      Assessment/Plan:   Lipid screening -     Comprehensive metabolic panel; Future -     Lipid panel; Future  Screening for deficiency anemia -     CBC; Future  Thyroid disorder screen -     TSH; Future  Encounter for general adult medical examination with abnormal findings Assessment & Plan: Physical exam completed.  Encouraged healthy diet.  Discussed increasing exercise after she delivers her child.  Pap smears through gynecology.  She will get her flu vaccine through work.  Will hold off on lab work until after she has delivered her child.  Labs ordered as outlined to be done 6 months from now.   Varicose veins of leg with swelling, right Assessment & Plan: Chronic issue.  Discussed continued compression stockings during the day.  Advised not to wear compression stockings at night.  Discussed seeing vascular surgery if her symptoms do not improve after she has delivered her child.     Return in about 6 months (around 05/05/2023) for labs, 1 year CPE.   Marikay Alar, MD [facility]

## 2022-11-05 NOTE — Assessment & Plan Note (Signed)
Physical exam completed.  Encouraged healthy diet.  Discussed increasing exercise after she delivers her child.  Pap smears through gynecology.  She will get her flu vaccine through work.  Will hold off on lab work until after she has delivered her child.  Labs ordered as outlined to be done 6 months from now.

## 2022-11-12 DIAGNOSIS — O0993 Supervision of high risk pregnancy, unspecified, third trimester: Secondary | ICD-10-CM | POA: Diagnosis not present

## 2022-11-12 DIAGNOSIS — O09299 Supervision of pregnancy with other poor reproductive or obstetric history, unspecified trimester: Secondary | ICD-10-CM | POA: Diagnosis not present

## 2022-11-14 ENCOUNTER — Encounter: Payer: 59 | Admitting: Family Medicine

## 2022-11-19 DIAGNOSIS — F4320 Adjustment disorder, unspecified: Secondary | ICD-10-CM | POA: Diagnosis not present

## 2022-11-20 DIAGNOSIS — O09293 Supervision of pregnancy with other poor reproductive or obstetric history, third trimester: Secondary | ICD-10-CM | POA: Diagnosis not present

## 2022-11-20 DIAGNOSIS — E039 Hypothyroidism, unspecified: Secondary | ICD-10-CM | POA: Diagnosis not present

## 2022-11-20 DIAGNOSIS — O99280 Endocrine, nutritional and metabolic diseases complicating pregnancy, unspecified trimester: Secondary | ICD-10-CM | POA: Diagnosis not present

## 2022-11-21 DIAGNOSIS — O0993 Supervision of high risk pregnancy, unspecified, third trimester: Secondary | ICD-10-CM | POA: Diagnosis not present

## 2022-11-21 DIAGNOSIS — O09299 Supervision of pregnancy with other poor reproductive or obstetric history, unspecified trimester: Secondary | ICD-10-CM | POA: Diagnosis not present

## 2022-11-21 DIAGNOSIS — Z3A32 32 weeks gestation of pregnancy: Secondary | ICD-10-CM | POA: Diagnosis not present

## 2022-11-21 DIAGNOSIS — Z23 Encounter for immunization: Secondary | ICD-10-CM | POA: Diagnosis not present

## 2022-11-21 DIAGNOSIS — Z2911 Encounter for prophylactic immunotherapy for respiratory syncytial virus (RSV): Secondary | ICD-10-CM | POA: Diagnosis not present

## 2022-11-28 DIAGNOSIS — O0993 Supervision of high risk pregnancy, unspecified, third trimester: Secondary | ICD-10-CM | POA: Diagnosis not present

## 2022-11-28 DIAGNOSIS — O09293 Supervision of pregnancy with other poor reproductive or obstetric history, third trimester: Secondary | ICD-10-CM | POA: Diagnosis not present

## 2022-12-03 ENCOUNTER — Other Ambulatory Visit: Payer: Self-pay

## 2022-12-03 ENCOUNTER — Ambulatory Visit: Payer: 59 | Admitting: Cardiology

## 2022-12-03 DIAGNOSIS — F4320 Adjustment disorder, unspecified: Secondary | ICD-10-CM | POA: Diagnosis not present

## 2022-12-05 DIAGNOSIS — O0993 Supervision of high risk pregnancy, unspecified, third trimester: Secondary | ICD-10-CM | POA: Diagnosis not present

## 2022-12-05 DIAGNOSIS — O09293 Supervision of pregnancy with other poor reproductive or obstetric history, third trimester: Secondary | ICD-10-CM | POA: Diagnosis not present

## 2022-12-11 DIAGNOSIS — E063 Autoimmune thyroiditis: Secondary | ICD-10-CM | POA: Diagnosis not present

## 2022-12-11 DIAGNOSIS — E079 Disorder of thyroid, unspecified: Secondary | ICD-10-CM | POA: Diagnosis not present

## 2022-12-11 DIAGNOSIS — O0993 Supervision of high risk pregnancy, unspecified, third trimester: Secondary | ICD-10-CM | POA: Diagnosis not present

## 2022-12-11 DIAGNOSIS — O99283 Endocrine, nutritional and metabolic diseases complicating pregnancy, third trimester: Secondary | ICD-10-CM | POA: Diagnosis not present

## 2022-12-11 DIAGNOSIS — Z3482 Encounter for supervision of other normal pregnancy, second trimester: Secondary | ICD-10-CM | POA: Diagnosis not present

## 2022-12-11 DIAGNOSIS — Z3483 Encounter for supervision of other normal pregnancy, third trimester: Secondary | ICD-10-CM | POA: Diagnosis not present

## 2022-12-12 DIAGNOSIS — O09293 Supervision of pregnancy with other poor reproductive or obstetric history, third trimester: Secondary | ICD-10-CM | POA: Diagnosis not present

## 2022-12-12 DIAGNOSIS — O0993 Supervision of high risk pregnancy, unspecified, third trimester: Secondary | ICD-10-CM | POA: Diagnosis not present

## 2022-12-12 DIAGNOSIS — Z3A35 35 weeks gestation of pregnancy: Secondary | ICD-10-CM | POA: Diagnosis not present

## 2022-12-13 ENCOUNTER — Observation Stay
Admission: RE | Admit: 2022-12-13 | Discharge: 2022-12-13 | Disposition: A | Discharge status: Home / Self Care | Payer: 59

## 2022-12-13 ENCOUNTER — Encounter: Payer: Self-pay | Admitting: Obstetrics and Gynecology

## 2022-12-13 DIAGNOSIS — O4703 False labor before 37 completed weeks of gestation, third trimester: Secondary | ICD-10-CM | POA: Diagnosis not present

## 2022-12-13 DIAGNOSIS — O09523 Supervision of elderly multigravida, third trimester: Secondary | ICD-10-CM | POA: Diagnosis not present

## 2022-12-13 DIAGNOSIS — O99283 Endocrine, nutritional and metabolic diseases complicating pregnancy, third trimester: Secondary | ICD-10-CM | POA: Insufficient documentation

## 2022-12-13 DIAGNOSIS — O0993 Supervision of high risk pregnancy, unspecified, third trimester: Secondary | ICD-10-CM | POA: Diagnosis not present

## 2022-12-13 DIAGNOSIS — E039 Hypothyroidism, unspecified: Secondary | ICD-10-CM | POA: Insufficient documentation

## 2022-12-13 DIAGNOSIS — O163 Unspecified maternal hypertension, third trimester: Principal | ICD-10-CM | POA: Insufficient documentation

## 2022-12-13 DIAGNOSIS — O368130 Decreased fetal movements, third trimester, not applicable or unspecified: Secondary | ICD-10-CM | POA: Diagnosis not present

## 2022-12-13 DIAGNOSIS — Z3A35 35 weeks gestation of pregnancy: Secondary | ICD-10-CM | POA: Diagnosis not present

## 2022-12-13 DIAGNOSIS — O99891 Other specified diseases and conditions complicating pregnancy: Secondary | ICD-10-CM | POA: Diagnosis not present

## 2022-12-13 DIAGNOSIS — R030 Elevated blood-pressure reading, without diagnosis of hypertension: Secondary | ICD-10-CM | POA: Diagnosis not present

## 2022-12-13 LAB — COMPREHENSIVE METABOLIC PANEL
ALT: 15 U/L (ref 0–44)
AST: 20 U/L (ref 15–41)
Albumin: 2.8 g/dL — ABNORMAL LOW (ref 3.5–5.0)
Alkaline Phosphatase: 111 U/L (ref 38–126)
Anion gap: 7 (ref 5–15)
BUN: 14 mg/dL (ref 6–20)
CO2: 23 mmol/L (ref 22–32)
Calcium: 9 mg/dL (ref 8.9–10.3)
Chloride: 104 mmol/L (ref 98–111)
Creatinine, Ser: 0.71 mg/dL (ref 0.44–1.00)
GFR, Estimated: >60 mL/min (ref >60)
Glucose, Bld: 80 mg/dL (ref 70–99)
Potassium: 4.2 mmol/L (ref 3.5–5.1)
Sodium: 134 mmol/L — ABNORMAL LOW (ref 135–145)
Total Bilirubin: 0.5 mg/dL (ref 0.3–1.2)
Total Protein: 6.4 g/dL — ABNORMAL LOW (ref 6.5–8.1)

## 2022-12-13 LAB — CBC
HCT: 38.1 % (ref 36.0–46.0)
Hemoglobin: 13 g/dL (ref 12.0–15.0)
MCH: 31.9 pg (ref 26.0–34.0)
MCHC: 34.1 g/dL (ref 30.0–36.0)
MCV: 93.4 fL (ref 80.0–100.0)
Platelets: 116 10*3/uL — ABNORMAL LOW (ref 150–400)
RBC: 4.08 MIL/uL (ref 3.87–5.11)
RDW: 13 % (ref 11.5–15.5)
WBC: 9.8 10*3/uL (ref 4.0–10.5)
nRBC: 0 % (ref 0.0–0.2)

## 2022-12-13 LAB — PROTEIN / CREATININE RATIO, URINE
Creatinine, Urine: 25 mg/dL
Total Protein, Urine: <6 mg/dL

## 2022-12-13 MED ORDER — CALCIUM CARBONATE ANTACID 500 MG PO CHEW: 2.0000 | CHEWABLE_TABLET | ORAL | Status: DC | PRN

## 2022-12-13 MED ORDER — ACETAMINOPHEN 500 MG PO TABS: 1000.0000 mg | ORAL_TABLET | Freq: Four times a day (QID) | ORAL | Status: DC | PRN

## 2022-12-13 NOTE — Discharge Summary (Signed)
Tanya Glenn is a 37 y.o. female. She is at [redacted]w[redacted]d gestation. Patient's last menstrual period was 04/09/2022. Not found.   Prenatal care site: [facility]  Chief complaint: elevated blood pressure   Admission Diagnoses:  1) intrauterine pregnancy at [redacted]w[redacted]d  2) Elevated blood pressure complicating pregnancy in third trimester, antepartum [O16.3]  Discharge Diagnoses:  Principal Problem:   Elevated blood pressure complicating pregnancy in third trimester, antepartum  HPI: [name] presents to L&D with complaints of elevated blood pressure. She reports that her blood pressure at work was elevated (around 150/90) and repeat with systolic in 140's. She denies HA, visual changes, or RUQ pain.  Her pregnancy is complicated by history of IUFD in prior pregnancy and hypothyroidism .  She denies Contractions, Loss of fluid, or Vaginal bleeding. States she has frequent braxton hicks contractions. Endorses fetal movement as decreased overall in pregnancy. States that her baby doesn't move around a lot which generally concerns her d/t her history of fetal loss in past pregnancy.   S: Resting comfortably. no CTX, no VB.no LOF, Fetal movement present.  Maternal Medical History:  Past Medical Hx:  has a past medical history of Anxiety, Asthma, Asthma, Depression, Family history of breast cancer, Family history of gene mutation, Narcolepsy, and Symptomatic PVCs.    Past Surgical Hx:  has a past surgical history that includes Wisdom tooth extraction; Breast biopsy (Right, 04/25/2022); Breast biopsy (Left, 04/25/2022); Breast biopsy (Right, 04/25/2022); and Breast biopsy (Left, 04/25/2022).   Allergies  Allergen Reactions   Sulfa Antibiotics Hives     Prior to Admission medications   Medication Sig Start Date End Date Taking? Authorizing Provider  acetaminophen (TYLENOL) 325 MG tablet Take 2 tablets (650 mg total) by mouth every 4 (four) hours as needed (for pain scale < 4). 06/27/21  Yes Wilson,  Marny Lowenstein, CNM  albuterol (VENTOLIN HFA) 108 (90 Base) MCG/ACT inhaler Inhale 1 puff into the lungs every 4 (four) hours as needed for wheezing or shortness of breath 08/28/22 08/28/23 Yes Glori Luis, MD  beclomethasone (QVAR REDIHALER) 40 MCG/ACT inhaler INHALE 2 PUFFS INTO THE LUNGS TWO TIMES DAILY 11/29/21  Yes Glori Luis, MD  calcium carbonate (OSCAL) 1500 (600 Ca) MG TABS tablet Take 600 mg of elemental calcium by mouth 2 (two) times daily with a meal.   Yes [provider]  Clobetasol Propionate 0.05 % shampoo Apply thin film to dry scalp once daily (maximum dose: 50 g/week or 50 mL/week); leave in place for 15 minutes, then add water, lather, and rinse thoroughly. Limit treatment to 4 consecutive weeks. 11/08/21  Yes Glori Luis, MD  Ferrous Fumarate (HEMOCYTE - 106 MG FE) 324 (106 Fe) MG TABS tablet Take 1 tablet by mouth daily.   Yes [provider]  levothyroxine (SYNTHROID) 25 MCG tablet Take 1 tablet (25 mcg total) by mouth daily. Take on an empty stomach with a glass of water at least 30-60 minutes before breakfast. 06/26/22  Yes   omega-3 acid ethyl esters (LOVAZA) 1 g capsule Take by mouth 2 (two) times daily.   Yes [provider]  Prenatal MV-Min-Fe Fum-FA-DHA (PRENATAL 1 PO) Take by mouth.   Yes [provider]  senna-docusate (SENOKOT-S) 8.6-50 MG tablet Take 2 tablets by mouth daily. 06/28/21  Yes Wilson, Danielle Renee, CNM  Sodium Oxybate (XYREM PO) Take 3.75 g by mouth 2 (two) times daily. Taking at night. Patient not taking: Reported on 11/05/2022    [provider]  Social History: She  reports that she has never smoked. She has never used smokeless tobacco. She reports that she does not currently use alcohol. She reports that she does not use drugs.  Family History: family history includes Breast cancer (age of onset: 6) in her mother; Heart disease in her maternal grandmother; Hyperlipidemia in her father;  Skin cancer in her maternal aunt; Stroke in her maternal grandmother.   Review of Systems: A full review of systems was performed and negative except as noted in the HPI.     Pertinent Results:   O:  BP 136/81   Pulse 66   Temp 98.1 F (36.7 C) (Oral)   Resp 18   LMP 04/09/2022  Results for orders placed or performed during the hospital encounter of 12/13/22 (from the past 48 hour(s))  Comprehensive metabolic panel   Collection Time: 12/13/22  9:49 AM  Result Value Ref Range   Sodium 134 (L) 135 - 145 mmol/L   Potassium 4.2 3.5 - 5.1 mmol/L   Chloride 104 98 - 111 mmol/L   CO2 23 22 - 32 mmol/L   Glucose, Bld 80 70 - 99 mg/dL   BUN 14 6 - 20 mg/dL   Creatinine, Ser 1.61 0.44 - 1.00 mg/dL   Calcium 9.0 8.9 - 09.6 mg/dL   Total Protein 6.4 (L) 6.5 - 8.1 g/dL   Albumin 2.8 (L) 3.5 - 5.0 g/dL   AST 20 15 - 41 U/L   ALT 15 0 - 44 U/L   Alkaline Phosphatase 111 38 - 126 U/L   Total Bilirubin 0.5 0.3 - 1.2 mg/dL   GFR, Estimated >04 >54 mL/min   Anion gap 7 5 - 15  CBC   Collection Time: 12/13/22  9:49 AM  Result Value Ref Range   WBC 9.8 4.0 - 10.5 K/uL   RBC 4.08 3.87 - 5.11 MIL/uL   Hemoglobin 13.0 12.0 - 15.0 g/dL   HCT 09.8 11.9 - 14.7 %   MCV 93.4 80.0 - 100.0 fL   MCH 31.9 26.0 - 34.0 pg   MCHC 34.1 30.0 - 36.0 g/dL   RDW 82.9 56.2 - 13.0 %   Platelets 116 (L) 150 - 400 K/uL   nRBC 0.0 0.0 - 0.2 %  Protein / creatinine ratio, urine   Collection Time: 12/13/22 10:41 AM  Result Value Ref Range   Creatinine, Urine 25 mg/dL   Total Protein, Urine <6 mg/dL   Protein Creatinine Ratio        0.00 - 0.15 mg/mg[Cre]     Constitutional: NAD, AAOx3  PULM: nl respiratory effort Abd: gravid, non-tender Ext: Non-tender, Nonedmeatous Psych: mood appropriate, speech normal Pelvic : deferred  NST: Baseline FHR: 125 beats/min Variability: moderate Accelerations: present Decelerations: absent Tocometry: Irregular, mild contractions  Time: > 3 hours   Interpretation:  Category I INDICATIONS: Elevated blood pressure and history of IUFD RESULTS:  A NST procedure was performed with FHR monitoring and a normal baseline established, appropriate time of 20-40 minutes of evaluation, and accels >2 seen w 15x15 characteristics.  Results show a REACTIVE NST.   Consults: None  Procedures: NST  Hospital Course: The patient was admitted to Labor and Delivery Triage for observation. Blood pressures were overall normal with one mild range BP ( 139/93).  Preeclampsia were normal and she remained asymptomatic.  Dr. Jean Rosenthal reviewed blood pressures and labs. IOL scheduled for 12/26/2022 at 37 weeks d/t multiple risk factors including new onset elevated blood pressures and history of  IUFD in prior pregnancy. Will plan to continue outpatient antenatal testing.    She was deemed stable for discharge and further outpatient management.   Plan: 1) Reactive NST  -Category 1 tracing  -Reassuring fetal status   2) Elevated blood pressure  -Discussed warning signs to return to L&D triage with  -Preeclampsia labs normal and asymptomatic  -Keep prenatal appointment and NST for tomorrow   Discharge Condition: stable  Disposition: Discharge disposition: 01-Home or Self Care        Allergies as of 12/13/2022       Reactions   Sulfa Antibiotics Hives        Medication List     STOP taking these medications    omega-3 acid ethyl esters 1 g capsule Commonly known as: LOVAZA   XYREM PO       TAKE these medications    acetaminophen 325 MG tablet Commonly known as: Tylenol Take 2 tablets (650 mg total) by mouth every 4 (four) hours as needed (for pain scale < 4).   albuterol 108 (90 Base) MCG/ACT inhaler Commonly known as: VENTOLIN HFA Inhale 1 puff into the lungs every 4 (four) hours as needed for wheezing or shortness of breath   calcium carbonate 1500 (600 Ca) MG Tabs tablet Commonly known as: OSCAL Take 600 mg of elemental calcium by mouth 2 (two)  times daily with a meal.   Clobetasol Propionate 0.05 % shampoo Apply thin film to dry scalp once daily; leave in place for 15 minutes, then add water, lather, and rinse thoroughly. Limit treatment to 4 consecutive weeks. (Apply thin film to dry scalp once daily (maximum dose: 50 g/week or 50 mL/week); leave in place for 15 minutes, then add water, lather, and rinse thoroughly. Limit treatment to 4 consecutive weeks.)   Ferrous Fumarate 324 (106 Fe) MG Tabs tablet Commonly known as: HEMOCYTE - 106 mg FE Take 1 tablet by mouth daily.   levothyroxine 25 MCG tablet Commonly known as: SYNTHROID Take 1 tablet (25 mcg total) by mouth daily. Take on an empty stomach with a glass of water at least 30-60 minutes before breakfast.   PRENATAL 1 PO Take by mouth.   Qvar RediHaler 40 MCG/ACT inhaler Generic drug: beclomethasone INHALE 2 PUFFS INTO THE LUNGS TWO TIMES DAILY   senna-docusate 8.6-50 MG tablet Commonly known as: Senokot-S Take 2 tablets by mouth daily.        Follow-up Information     Conard Novak, MD. Go in 1 day(s).   Specialty: Obstetrics and Gynecology Why: routine prenatal appointment Contact information: [address] [place] [address] [phone]                ----- Gustavo Lah, CNM  Certified Nurse Midwife [facility]

## 2022-12-13 NOTE — OB Triage Note (Signed)
37 y.o G3P1101 presents to L&D triage at [redacted]w[redacted]d w c/o a high BP at home and reduced fetal movement. She reports having elevated bp's at home and decided to come up to be evaluated. She reports braxton hicks ctx's but denies vaginal bleeding. She reports her baby doesn't move a lot and she is concerned. Pt denies pain today, denies epigastric pain, headache, or vision disturbances. She reports having possible facial swelling and b/l ankle swelling. Monitors applied and assessing, PIH labs collected and awaiting result. [facility] CNM aware of pt arrival. BP's cycling Q15 min.

## 2022-12-13 NOTE — OB Triage Note (Signed)
Pt discharged home per order.   Pt stable and ambulatory and an After Visit Summary was printed and given to the patient. Discharge education completed with patient/family including follow up instructions, appointments, and medication list. Pt received labor and PIH precautions. Patient able to verbalize understanding, all questions fully answered upon discharge. Patient instructed to return to ED, call 911, or call MD for any changes in condition.

## 2022-12-13 NOTE — Discharge Instructions (Signed)
For blood pressure monitoring at home: Please check your blood pressure at home daily and write down what it is so that you can review it with your provider  Make sure you are able to sit for 15 minutes prior to taking it  If blood pressure 160/110 or greater repeat in 15 minutes.  If still 160/110 or greater come to Labor and Delivery for evaluation.  If it goes down please contact the on call provider to check in.   Please go to Labor and Delivery for severe range blood pressures (160/110 or higher), moderate to severe headache that is not relieved with Tylenol, chest pain, shortness of breath, changes in vision, or persistent pain in upper right abdomen.   LABOR: When contractions begin, you should start to time them from the beginning of one contraction to the beginning of the next.  When contractions are 5-10 minutes apart or less and have been regular for at least an hour, you should call your health care provider.  Notify your doctor if any of the following occur: 1. Bleeding from the vagina 7. Sudden, constant, or occasional abdominal pain  2. Pain or burning when urinating 8. Sudden gushing of fluid from the vagina (with or without continued leaking)  3. Chills or fever 9. Fainting spells, "black outs" or loss of consciousness  4. Increase in vaginal discharge 10. Severe or continued nausea or vomiting  5. Pelvic pressure (sudden increase) 11. Blurring of vision or spots before the eyes  6. Baby moving less than usual 12. Leaking of fluid    FETAL KICK COUNT: Lie on your left side for one hour after a meal, and count the number of times your baby kicks. If it is less than 5 times, get up, move around and drink some juice. Repeat the test 30 minutes later. If it is still less than 5 kicks in an hour, notify your doctor.

## 2022-12-14 DIAGNOSIS — O0991 Supervision of high risk pregnancy, unspecified, first trimester: Secondary | ICD-10-CM | POA: Diagnosis not present

## 2022-12-14 DIAGNOSIS — Z3A35 35 weeks gestation of pregnancy: Secondary | ICD-10-CM | POA: Diagnosis not present

## 2022-12-14 DIAGNOSIS — Z113 Encounter for screening for infections with a predominantly sexual mode of transmission: Secondary | ICD-10-CM | POA: Diagnosis not present

## 2022-12-14 LAB — OB RESULTS CONSOLE GBS: GBS: NEGATIVE

## 2022-12-14 LAB — OB RESULTS CONSOLE HIV ANTIBODY (ROUTINE TESTING): HIV: NONREACTIVE

## 2022-12-14 LAB — OB RESULTS CONSOLE GC/CHLAMYDIA
Chlamydia: NEGATIVE
Neisseria Gonorrhea: NEGATIVE

## 2022-12-14 LAB — OB RESULTS CONSOLE RPR: RPR: NONREACTIVE

## 2022-12-18 ENCOUNTER — Other Ambulatory Visit: Payer: Self-pay

## 2022-12-18 ENCOUNTER — Inpatient Hospital Stay
Admission: RE | Admit: 2022-12-18 | Discharge: 2022-12-21 | DRG: 806 | Disposition: A | Discharge status: Home / Self Care | Payer: 59 | Attending: Obstetrics and Gynecology | Admitting: Obstetrics and Gynecology

## 2022-12-18 ENCOUNTER — Encounter: Payer: Self-pay | Admitting: Obstetrics and Gynecology

## 2022-12-18 DIAGNOSIS — Z8481 Family history of carrier of genetic disease: Secondary | ICD-10-CM

## 2022-12-18 DIAGNOSIS — O1414 Severe pre-eclampsia complicating childbirth: Principal | ICD-10-CM | POA: Diagnosis present

## 2022-12-18 DIAGNOSIS — E039 Hypothyroidism, unspecified: Secondary | ICD-10-CM | POA: Diagnosis present

## 2022-12-18 DIAGNOSIS — O0993 Supervision of high risk pregnancy, unspecified, third trimester: Secondary | ICD-10-CM | POA: Diagnosis not present

## 2022-12-18 DIAGNOSIS — Z882 Allergy status to sulfonamides status: Secondary | ICD-10-CM

## 2022-12-18 DIAGNOSIS — Z3A36 36 weeks gestation of pregnancy: Secondary | ICD-10-CM | POA: Diagnosis not present

## 2022-12-18 DIAGNOSIS — Z808 Family history of malignant neoplasm of other organs or systems: Secondary | ICD-10-CM

## 2022-12-18 DIAGNOSIS — O9962 Diseases of the digestive system complicating childbirth: Secondary | ICD-10-CM | POA: Diagnosis not present

## 2022-12-18 DIAGNOSIS — G47419 Narcolepsy without cataplexy: Secondary | ICD-10-CM | POA: Diagnosis present

## 2022-12-18 DIAGNOSIS — O99354 Diseases of the nervous system complicating childbirth: Secondary | ICD-10-CM | POA: Diagnosis not present

## 2022-12-18 DIAGNOSIS — O1410 Severe pre-eclampsia, unspecified trimester: Secondary | ICD-10-CM | POA: Diagnosis present

## 2022-12-18 DIAGNOSIS — K219 Gastro-esophageal reflux disease without esophagitis: Secondary | ICD-10-CM | POA: Diagnosis present

## 2022-12-18 DIAGNOSIS — Z803 Family history of malignant neoplasm of breast: Secondary | ICD-10-CM | POA: Diagnosis not present

## 2022-12-18 DIAGNOSIS — Z8249 Family history of ischemic heart disease and other diseases of the circulatory system: Secondary | ICD-10-CM | POA: Diagnosis not present

## 2022-12-18 DIAGNOSIS — Z823 Family history of stroke: Secondary | ICD-10-CM

## 2022-12-18 DIAGNOSIS — O99284 Endocrine, nutritional and metabolic diseases complicating childbirth: Secondary | ICD-10-CM | POA: Diagnosis not present

## 2022-12-18 DIAGNOSIS — O321XX0 Maternal care for breech presentation, not applicable or unspecified: Secondary | ICD-10-CM | POA: Diagnosis present

## 2022-12-18 DIAGNOSIS — O1415 Severe pre-eclampsia, complicating the puerperium: Secondary | ICD-10-CM | POA: Diagnosis not present

## 2022-12-18 DIAGNOSIS — Z79890 Hormone replacement therapy (postmenopausal): Secondary | ICD-10-CM

## 2022-12-18 DIAGNOSIS — Z3490 Encounter for supervision of normal pregnancy, unspecified, unspecified trimester: Principal | ICD-10-CM

## 2022-12-18 LAB — COMPREHENSIVE METABOLIC PANEL
ALT: 17 U/L (ref 0–44)
AST: 22 U/L (ref 15–41)
Albumin: 2.5 g/dL — ABNORMAL LOW (ref 3.5–5.0)
Alkaline Phosphatase: 102 U/L (ref 38–126)
Anion gap: 7 (ref 5–15)
BUN: 20 mg/dL (ref 6–20)
CO2: 22 mmol/L (ref 22–32)
Calcium: 8.5 mg/dL — ABNORMAL LOW (ref 8.9–10.3)
Chloride: 104 mmol/L (ref 98–111)
Creatinine, Ser: 0.97 mg/dL (ref 0.44–1.00)
GFR, Estimated: >60 mL/min (ref >60)
Glucose, Bld: 84 mg/dL (ref 70–99)
Potassium: 4.1 mmol/L (ref 3.5–5.1)
Sodium: 133 mmol/L — ABNORMAL LOW (ref 135–145)
Total Bilirubin: 0.3 mg/dL (ref 0.3–1.2)
Total Protein: 5.7 g/dL — ABNORMAL LOW (ref 6.5–8.1)

## 2022-12-18 LAB — PROTEIN / CREATININE RATIO, URINE
Creatinine, Urine: 150 mg/dL
Protein Creatinine Ratio: 1.16 mg/mg{creat} — ABNORMAL HIGH (ref 0.00–0.15)
Total Protein, Urine: 174 mg/dL

## 2022-12-18 LAB — CBC
HCT: 37.2 % (ref 36.0–46.0)
Hemoglobin: 13 g/dL (ref 12.0–15.0)
MCH: 32.1 pg (ref 26.0–34.0)
MCHC: 34.9 g/dL (ref 30.0–36.0)
MCV: 91.9 fL (ref 80.0–100.0)
Platelets: 123 10*3/uL — ABNORMAL LOW (ref 150–400)
RBC: 4.05 MIL/uL (ref 3.87–5.11)
RDW: 12.9 % (ref 11.5–15.5)
WBC: 9.5 10*3/uL (ref 4.0–10.5)
nRBC: 0 % (ref 0.0–0.2)

## 2022-12-18 LAB — TYPE AND SCREEN
ABO/RH(D): O POS
Antibody Screen: NEGATIVE

## 2022-12-18 MED ORDER — HYDRALAZINE HCL 20 MG/ML IJ SOLN: 10.0000 mg | INTRAMUSCULAR | Status: DC | PRN

## 2022-12-18 MED ORDER — LIDOCAINE HCL (PF) 1 % IJ SOLN
30.0000 mL | INTRAMUSCULAR | Status: DC | PRN
  Filled 2022-12-18: qty 30

## 2022-12-18 MED ORDER — SOD CITRATE-CITRIC ACID 500-334 MG/5ML PO SOLN: 30.0000 mL | ORAL | Status: DC | PRN

## 2022-12-18 MED ORDER — OXYTOCIN 10 UNIT/ML IJ SOLN: 10.0000 [IU] | Freq: Once | INTRAMUSCULAR | Status: DC

## 2022-12-18 MED ORDER — LABETALOL HCL 5 MG/ML IV SOLN: 40.0000 mg | INTRAVENOUS | Status: DC | PRN

## 2022-12-18 MED ORDER — OXYTOCIN BOLUS FROM INFUSION
333.0000 mL | Freq: Once | INTRAVENOUS | Status: AC
  Administered 2022-12-19: 333 mL via INTRAVENOUS

## 2022-12-18 MED ORDER — NIFEDIPINE 10 MG PO CAPS
20.0000 mg | ORAL_CAPSULE | ORAL | Status: DC | PRN
  Filled 2022-12-18: qty 2

## 2022-12-18 MED ORDER — MAGNESIUM SULFATE BOLUS VIA INFUSION
4.0000 g | Freq: Once | INTRAVENOUS | Status: AC
  Administered 2022-12-18: 4 g via INTRAVENOUS
  Filled 2022-12-18: qty 1000

## 2022-12-18 MED ORDER — OXYTOCIN 10 UNIT/ML IJ SOLN
INTRAMUSCULAR | Status: AC
  Filled 2022-12-18: qty 2

## 2022-12-18 MED ORDER — TERBUTALINE SULFATE 1 MG/ML IJ SOLN: 0.2500 mg | Freq: Once | INTRAMUSCULAR | Status: DC | PRN

## 2022-12-18 MED ORDER — LABETALOL HCL 5 MG/ML IV SOLN: 80.0000 mg | INTRAVENOUS | Status: DC | PRN

## 2022-12-18 MED ORDER — MISOPROSTOL 200 MCG PO TABS
ORAL_TABLET | ORAL | Status: AC
  Filled 2022-12-18: qty 4

## 2022-12-18 MED ORDER — NIFEDIPINE 10 MG PO CAPS
20.0000 mg | ORAL_CAPSULE | ORAL | Status: DC | PRN
  Filled 2022-12-18 (×2): qty 2

## 2022-12-18 MED ORDER — OXYTOCIN-SODIUM CHLORIDE 30-0.9 UT/500ML-% IV SOLN
2.5000 [IU]/h | INTRAVENOUS | Status: DC
  Administered 2022-12-19: 2.5 [IU]/h via INTRAVENOUS
  Filled 2022-12-18: qty 500

## 2022-12-18 MED ORDER — NIFEDIPINE 10 MG PO CAPS
10.0000 mg | ORAL_CAPSULE | ORAL | Status: DC | PRN
  Administered 2022-12-18 – 2022-12-19 (×3): 10 mg via ORAL
  Filled 2022-12-18: qty 1

## 2022-12-18 MED ORDER — LACTATED RINGERS IV SOLN: 500.0000 mL | INTRAVENOUS | Status: DC | PRN

## 2022-12-18 MED ORDER — ACETAMINOPHEN 325 MG PO TABS
650.0000 mg | ORAL_TABLET | Freq: Four times a day (QID) | ORAL | Status: DC | PRN
  Administered 2022-12-18: 650 mg via ORAL
  Filled 2022-12-18: qty 2

## 2022-12-18 MED ORDER — LACTATED RINGERS IV SOLN
INTRAVENOUS | Status: DC
Start: 2022-12-18 — End: 2022-12-19

## 2022-12-18 MED ORDER — MAGNESIUM SULFATE 40 GM/1000ML IV SOLN
2.0000 g/h | INTRAVENOUS | Status: DC
  Filled 2022-12-18: qty 1000

## 2022-12-18 MED ORDER — LABETALOL HCL 5 MG/ML IV SOLN: 20.0000 mg | INTRAVENOUS | Status: DC | PRN

## 2022-12-18 MED ORDER — ONDANSETRON HCL 4 MG/2ML IJ SOLN
4.0000 mg | Freq: Four times a day (QID) | INTRAMUSCULAR | Status: DC | PRN
  Administered 2022-12-18: 4 mg via INTRAVENOUS
  Filled 2022-12-18: qty 2

## 2022-12-18 MED ORDER — AMMONIA AROMATIC IN INHA
RESPIRATORY_TRACT | Status: AC
  Filled 2022-12-18: qty 10

## 2022-12-18 MED ORDER — MISOPROSTOL 25 MCG QUARTER TABLET
25.0000 ug | ORAL_TABLET | ORAL | Status: DC | PRN
  Administered 2022-12-18: 25 ug via VAGINAL
  Filled 2022-12-18 (×2): qty 1

## 2022-12-18 MED ORDER — FENTANYL-BUPIVACAINE-NACL 0.5-0.125-0.9 MG/250ML-% EP SOLN
EPIDURAL | Status: AC
  Filled 2022-12-18: qty 250

## 2022-12-18 MED ORDER — ACETAMINOPHEN 500 MG PO TABS
500.0000 mg | ORAL_TABLET | Freq: Once | ORAL | Status: AC
  Administered 2022-12-18: 500 mg via ORAL
  Filled 2022-12-18: qty 1

## 2022-12-18 MED ORDER — LACTATED RINGERS IV SOLN: INTRAVENOUS | Status: DC

## 2022-12-18 NOTE — OB Triage Note (Signed)
Patient is a G3P1101 at [redacted]w[redacted]d who presents to unit for PIH eval. Patient reports elevated Bps at work and a HA. Reports occasional ctx, +FM, denies LOF and vaginal bleeding. External monitors applied and assessing. Initial FHT 135. Initial BP 158/100, cycling q6min.

## 2022-12-19 ENCOUNTER — Inpatient Hospital Stay: Payer: 59 | Admitting: Anesthesiology

## 2022-12-19 ENCOUNTER — Encounter: Payer: Self-pay | Admitting: Obstetrics and Gynecology

## 2022-12-19 LAB — CBC
HCT: 37.9 % (ref 36.0–46.0)
Hemoglobin: 13.6 g/dL (ref 12.0–15.0)
MCH: 32 pg (ref 26.0–34.0)
MCHC: 35.9 g/dL (ref 30.0–36.0)
MCV: 89.2 fL (ref 80.0–100.0)
Platelets: 116 10*3/uL — ABNORMAL LOW (ref 150–400)
RBC: 4.25 MIL/uL (ref 3.87–5.11)
RDW: 12.5 % (ref 11.5–15.5)
WBC: 12.1 10*3/uL — ABNORMAL HIGH (ref 4.0–10.5)
nRBC: 0 % (ref 0.0–0.2)

## 2022-12-19 LAB — RPR: RPR Ser Ql: NONREACTIVE

## 2022-12-19 MED ORDER — BENZOCAINE-MENTHOL 20-0.5 % EX AERO: 1.0000 | INHALATION_SPRAY | CUTANEOUS | Status: DC | PRN

## 2022-12-19 MED ORDER — COCONUT OIL OIL: 1.0000 | TOPICAL_OIL | Status: DC | PRN

## 2022-12-19 MED ORDER — DIPHENHYDRAMINE HCL 25 MG PO CAPS: 25.0000 mg | ORAL_CAPSULE | Freq: Four times a day (QID) | ORAL | Status: DC | PRN

## 2022-12-19 MED ORDER — WITCH HAZEL-GLYCERIN EX PADS: 1.0000 | MEDICATED_PAD | CUTANEOUS | Status: DC | PRN

## 2022-12-19 MED ORDER — LIDOCAINE HCL (PF) 1 % IJ SOLN
INTRAMUSCULAR | Status: DC | PRN
  Administered 2022-12-19: 3 mL via SUBCUTANEOUS

## 2022-12-19 MED ORDER — DIPHENHYDRAMINE HCL 50 MG/ML IJ SOLN: 12.5000 mg | INTRAMUSCULAR | Status: DC | PRN

## 2022-12-19 MED ORDER — DIBUCAINE (PERIANAL) 1 % EX OINT: 1.0000 | TOPICAL_OINTMENT | CUTANEOUS | Status: DC | PRN

## 2022-12-19 MED ORDER — NIFEDIPINE ER OSMOTIC RELEASE 30 MG PO TB24
30.0000 mg | ORAL_TABLET | Freq: Every day | ORAL | Status: DC
  Administered 2022-12-19: 30 mg via ORAL

## 2022-12-19 MED ORDER — LACTATED RINGERS IV SOLN: INTRAVENOUS | Status: AC

## 2022-12-19 MED ORDER — FERROUS SULFATE 325 (65 FE) MG PO TABS
325.0000 mg | ORAL_TABLET | Freq: Two times a day (BID) | ORAL | Status: DC
  Administered 2022-12-19 – 2022-12-21 (×5): 325 mg via ORAL
  Filled 2022-12-19 (×5): qty 1

## 2022-12-19 MED ORDER — NIFEDIPINE ER OSMOTIC RELEASE 30 MG PO TB24
ORAL_TABLET | ORAL | Status: AC
  Filled 2022-12-19: qty 1

## 2022-12-19 MED ORDER — EPHEDRINE 5 MG/ML INJ: 10.0000 mg | INTRAVENOUS | Status: DC | PRN

## 2022-12-19 MED ORDER — PHENYLEPHRINE 80 MCG/ML (10ML) SYRINGE FOR IV PUSH (FOR BLOOD PRESSURE SUPPORT): 80.0000 ug | PREFILLED_SYRINGE | INTRAVENOUS | Status: DC | PRN

## 2022-12-19 MED ORDER — TETANUS-DIPHTH-ACELL PERTUSSIS 5-2.5-18.5 LF-MCG/0.5 IM SUSY: 0.5000 mL | PREFILLED_SYRINGE | Freq: Once | INTRAMUSCULAR | Status: DC

## 2022-12-19 MED ORDER — SODIUM CHLORIDE 0.9 % IV SOLN
INTRAVENOUS | Status: DC | PRN
  Administered 2022-12-19: 5 mL via EPIDURAL
  Administered 2022-12-19: 4 mL via EPIDURAL

## 2022-12-19 MED ORDER — SIMETHICONE 80 MG PO CHEW: 80.0000 mg | CHEWABLE_TABLET | ORAL | Status: DC | PRN

## 2022-12-19 MED ORDER — OXYCODONE HCL 5 MG PO TABS
5.0000 mg | ORAL_TABLET | ORAL | Status: DC | PRN
Start: 2022-12-19 — End: 2022-12-21

## 2022-12-19 MED ORDER — PRENATAL MULTIVITAMIN CH
1.0000 | ORAL_TABLET | Freq: Every day | ORAL | Status: DC
  Administered 2022-12-19 – 2022-12-20 (×2): 1 via ORAL
  Filled 2022-12-19 (×2): qty 1

## 2022-12-19 MED ORDER — MAGNESIUM SULFATE 40 GM/1000ML IV SOLN: 2.0000 g/h | INTRAVENOUS | Status: AC

## 2022-12-19 MED ORDER — IBUPROFEN 600 MG PO TABS
ORAL_TABLET | ORAL | Status: AC
  Administered 2022-12-20: 600 mg via ORAL
  Filled 2022-12-19: qty 1

## 2022-12-19 MED ORDER — FENTANYL-BUPIVACAINE-NACL 0.5-0.125-0.9 MG/250ML-% EP SOLN
12.0000 mL/h | EPIDURAL | Status: DC | PRN
  Administered 2022-12-19: 12 mL/h via EPIDURAL

## 2022-12-19 MED ORDER — ONDANSETRON HCL 4 MG PO TABS: 4.0000 mg | ORAL_TABLET | ORAL | Status: DC | PRN

## 2022-12-19 MED ORDER — IBUPROFEN 600 MG PO TABS
600.0000 mg | ORAL_TABLET | Freq: Four times a day (QID) | ORAL | Status: DC
  Administered 2022-12-19 – 2022-12-21 (×8): 600 mg via ORAL
  Filled 2022-12-19 (×8): qty 1

## 2022-12-19 MED ORDER — SENNOSIDES-DOCUSATE SODIUM 8.6-50 MG PO TABS
2.0000 | ORAL_TABLET | Freq: Every day | ORAL | Status: DC
  Administered 2022-12-20 – 2022-12-21 (×2): 2 via ORAL
  Filled 2022-12-19 (×2): qty 2

## 2022-12-19 MED ORDER — ONDANSETRON HCL 4 MG/2ML IJ SOLN: 4.0000 mg | INTRAMUSCULAR | Status: DC | PRN

## 2022-12-19 MED ORDER — MAGNESIUM SULFATE 40 GM/1000ML IV SOLN
INTRAVENOUS | Status: AC
  Administered 2022-12-19: 2 g/h via INTRAVENOUS
  Filled 2022-12-19: qty 1000

## 2022-12-19 MED ORDER — LIDOCAINE-EPINEPHRINE (PF) 1.5 %-1:200000 IJ SOLN
INTRAMUSCULAR | Status: DC | PRN
  Administered 2022-12-19: 4 mL via EPIDURAL

## 2022-12-19 MED ORDER — OXYCODONE HCL 5 MG PO TABS: 10.0000 mg | ORAL_TABLET | ORAL | Status: DC | PRN

## 2022-12-19 MED ORDER — ACETAMINOPHEN 325 MG PO TABS
650.0000 mg | ORAL_TABLET | ORAL | Status: DC | PRN
  Administered 2022-12-19 – 2022-12-20 (×3): 650 mg via ORAL
  Filled 2022-12-19 (×3): qty 2

## 2022-12-19 NOTE — Progress Notes (Signed)
.  Postpartum Day  0  Subjective: 37 y.o. [id number] postpartum day #0 status post normal spontaneous vaginal delivery. She is ambulating, is tolerating po, is voiding spontaneously.  Her pain is well controlled on PO pain medications. Her lochia is less than menses.  Objective: BP (!) 152/85   Pulse 95   Temp 98.7 F (37.1 C) (Oral)   Resp 16   Ht 5' 6.5" (1.689 m)   Wt 83.9 kg   LMP 04/09/2022   SpO2 98%   Breastfeeding Unknown   BMI 29.41 kg/m    Physical Exam:  General: alert, cooperative, and appears stated age Breasts: soft/nontender Pulm: nl effort Abdomen: soft, non-tender, active bowel sounds Uterine Fundus: firm Perineum: minimal edema, intact Lochia: appropriate DVT Evaluation: No evidence of DVT seen on physical exam. Negative Homan's sign. No cords or calf tenderness. No significant calf/ankle edema.  Recent Labs    12/18/22 1426 12/19/22 0755  HGB 13.0 13.6  HCT 37.2 37.9  WBC 9.5 12.1*  PLT 123* 116*    Assessment/Plan: 37 y.o. [id number] postpartum day # 0  1. Continue routine postpartum care  2. Infant feeding status: breast feeding --Lactation consult PRN for breastfeeding -lactation consultant at bedside  3. Contraception plan: no method  4. Acute blood loss anemia - clinically not significant .  --Hemodynamically stable and asymptomatic --Intervention: no intervention   5. Immunization status:   all immunizations up to date  6. Start Procardia Xl 30 mg daily for mild range BP  7. Continue Magnesium Sulfate infusion at 50g/hr x 24 hours post delivery stop at 0100  Disposition: continue inpatient postpartum care    LOS: 1 day   Tanya Glenn, CNM 12/19/2022, 12:02 PM   ----- Tanya Glenn Certified Nurse Midwife [facility]

## 2022-12-19 NOTE — Anesthesia Procedure Notes (Signed)
Epidural Patient location during procedure: OB End time: 12/19/2022 12:37 AM  Staffing Performed: anesthesiologist   Preanesthetic Checklist Completed: patient identified, IV checked, site marked, risks and benefits discussed, surgical consent, monitors and equipment checked, pre-op evaluation and timeout performed  Epidural Patient position: sitting Prep: Betadine Patient monitoring: heart rate, continuous pulse ox and blood pressure Approach: midline Location: L4-L5 Injection technique: LOR saline  Needle:  Needle type: Tuohy  Needle gauge: 17 G Needle length: 9 cm and 9 Needle insertion depth: 6 cm Catheter type: closed end flexible Catheter size: 19 Gauge Catheter at skin depth: 13 cm Test dose: negative and 1.5% lidocaine with Epi 1:200 K  Assessment Events: blood not aspirated, no cerebrospinal fluid, injection not painful, no injection resistance, no paresthesia and negative IV test  Additional Notes   Patient tolerated the insertion well without complications.Reason for block:procedure for pain

## 2022-12-19 NOTE — Anesthesia Preprocedure Evaluation (Signed)
Anesthesia Evaluation  Patient identified by MRN, date of birth, ID band Patient awake    Reviewed: Allergy & Precautions, H&P , NPO status , Patient's Chart, lab work & pertinent test results, reviewed documented beta blocker date and time   Airway Mallampati: II  TM Distance: >3 FB Neck ROM: full    Dental no notable dental hx. (+) Teeth Intact   Pulmonary asthma    Pulmonary exam normal breath sounds clear to auscultation       Cardiovascular Exercise Tolerance: Good hypertension, On Medications  Rhythm:regular Rate:Normal     Neuro/Psych  PSYCHIATRIC DISORDERS Anxiety Depression    negative neurological ROS     GI/Hepatic Neg liver ROS,GERD  Medicated,,  Endo/Other  diabetes, Well Controlled, GestationalHypothyroidism    Renal/GU      Musculoskeletal   Abdominal   Peds  Hematology negative hematology ROS (+)   Anesthesia Other Findings   Reproductive/Obstetrics (+) Pregnancy                             Anesthesia Physical Anesthesia Plan  ASA: 2  Anesthesia Plan: Epidural   Post-op Pain Management:    Induction:   PONV Risk Score and Plan:   Airway Management Planned:   Additional Equipment:   Intra-op Plan:   Post-operative Plan:   Informed Consent: I have reviewed the patients History and Physical, chart, labs and discussed the procedure including the risks, benefits and alternatives for the proposed anesthesia with the patient or authorized representative who has indicated his/her understanding and acceptance.       Plan Discussed with:   Anesthesia Plan Comments:        Anesthesia Quick Evaluation

## 2022-12-19 NOTE — H&P (Signed)
OB History & Physical   History of Present Illness:  Chief Complaint: elevated blood pressure check at work  HPI:  Tanya Glenn is a 37 y.o. [id number] female at [redacted]w[redacted]d dated by LMP consistent with 7 week ultrasound.  Her pregnancy has been complicated by AMA, gestational hypertension, history of fetal demise at 28 weeks, hypothyroidism, narcolepsy .    She reports her usual contractions.  These were particularly strong last night.  She denies leakage of fluid.   She denies vaginal bleeding.   She reports fetal movement.   She has had a headache and she did take tylenol 500 mg for this, but has not gone away. She denies visual changes and RUQ pain.   Total weight gain for pregnancy: about 48 lbs.     37 y.o. 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 []  Consider MFM consultation 3.   Hypothyroidism See's Dr Tedd Sias - monthly TFTs 4.   Narcolepsy: No medications currently. Did well in last pregnancy. 5.   Dr Jean Rosenthal will Special patient. 24/7 unless on vacation   Screening results and needs: NOB:  Medicaid Questionnaire:  []  Tanya Glenn  Depression Score: 11  MBT: O positive   Ab screen: neg   HIV: neg  Hep B/RPR: neg/NR   Pap: NILM/neg 11/29/2022G/C: neg/neg Rubella: 6.47   VZV: 916 TSH: 1.370HgA1C: 5.5 Aneuploidy:  First trimester:  MaternitT21:   negative (06/13/22) Second trimester (AFP/tetra): negative 28 weeks:  Review Medicaid Questionnaire:n/a []  Tanya Glenn  Depression Score:10 Blood consent:signed 10/24/22  Hgb:11.3  Platelets:123    Glucola:135,  3HR GTT 11/01/22: [phone]    Rhogam:n/a 36 weeks:  GBS:neg   G/C:neg/neg   Hgb:13.8  Platelets:118    HIV/RPR:neg/NR    Last [place]:  06/18/22: Single, viable IUP, S= [redacted]w[redacted]d  Yolk sac and amnion seen  FHR= 172bpm  No free fluid seen 08/22/22: EFW 315g 74%, The cervical length is 3.93cm, Cardiac activity Present. FHR 154 bpm. Presentation: Breech, Placenta: Placental site: Posterior. No evidence of previa, Amniotic fluid:Amount of AF: Normal. 10/24/22: Cardiac activity Present. FHR 134 bpm. Presentation: Cephalic, Placenta: Placental site: Posterior. It has been documented in a previous report that there is no evidence of placenta previa. Amniotic fluid: Amount of AF: Normal. MVP 5.7 cm. AFI 15.1 cm 11/20/22: Cervix Visualized, Approach - Transabdominal, Cardiac activity Present. FHR 161 bpm. Presentation: Cephalic, Placenta: Placental site: Posterior. No evidence of previa, Amniotic fluid: Amount of AF: Normal. MVP 4.2 cm. AFI 12.0 cm, A single, viable intrauterine pregnancy, measurements at that exam reported as 7w 2d, The Estimated Fetal Weight is 4lb13oz (2,196 g) with a percentile of 79%. The Abdominal Circumference percentile is at 84%. The amniotic fluid volume appears normal, measuring 12 cm. Fetal anatomy appears normal or was previously documented as normal. Immunization:   Flu in season -received 11/21/22 bl Tdap at 27-36 weeks -received 10/24/22  Covid-19 -  RSV-received 11/21/22 bl Contraception Plan:  Feeding Plan: breast Labor Plans:    Maternal Medical History:   Past Medical History:  Diagnosis Date   Anxiety    Asthma    Asthma    Depression    Family history of breast cancer    Family history of gene mutation    Narcolepsy    Symptomatic PVCs     Past Surgical History:  Procedure Laterality Date   BREAST BIOPSY Right 04/25/2022   Right Breast Stereo Bx, X clip path pending   BREAST BIOPSY Left 04/25/2022   Left Breast Stereo Bx, Coil clip - path pending   BREAST BIOPSY Right 04/25/2022   MM RT BREAST BX  W LOC DEV 1ST LESION IMAGE BX SPEC STEREO GUIDE 04/25/2022 [facility]   BREAST BIOPSY Left 04/25/2022   MM LT BREAST BX W LOC DEV 1ST LESION IMAGE BX SPEC STEREO GUIDE 04/25/2022 [facility]   WISDOM TOOTH EXTRACTION      Allergies  Allergen Reactions   Sulfa Antibiotics Hives    Prior to Admission medications   Medication Sig Start Date End Date Taking? Authorizing Provider  acetaminophen (TYLENOL) 325 MG tablet Take 2 tablets (650 mg total) by mouth every 4 (four) hours as needed (for pain scale < 4). 06/27/21   Janyce Llanos, CNM  albuterol (VENTOLIN HFA) 108 (90 Base) MCG/ACT inhaler Inhale 1 puff into the lungs every 4 (four) hours as needed for wheezing or shortness of breath 08/28/22 08/28/23  Glori Luis, MD  beclomethasone (QVAR REDIHALER) 40 MCG/ACT inhaler INHALE 2 PUFFS INTO THE LUNGS TWO TIMES DAILY 11/29/21   Glori Luis, MD  calcium carbonate (OSCAL) 1500 (600 Ca) MG TABS tablet Take 600 mg of elemental calcium by mouth 2 (two) times daily with a meal.    [provider]  Clobetasol Propionate 0.05 % shampoo Apply thin film to dry scalp once daily (maximum dose: 50 g/week or 50 mL/week); leave in place for 15 minutes, then add water, lather, and rinse thoroughly. Limit treatment to 4 consecutive weeks. 11/08/21   Glori Luis, MD  Ferrous Fumarate (HEMOCYTE - 106 MG FE) 324 (106 Fe) MG TABS tablet Take 1 tablet by mouth daily.    [provider]  levothyroxine (SYNTHROID) 25 MCG tablet Take 1 tablet (25 mcg total) by mouth daily. Take on an empty stomach with a glass of water at least 30-60 minutes before breakfast. 06/26/22     Prenatal MV-Min-Fe Fum-FA-DHA (PRENATAL 1 PO) Take by mouth.    [provider]  senna-docusate (SENOKOT-S) 8.6-50 MG tablet Take 2 tablets by mouth daily. 06/28/21   Janyce Llanos, CNM    OB History  Gravida Para Term Preterm AB Living  3 3 1 2  0 2  SAB IAB Ectopic Multiple Live Births  0  0 0 0 2    # Outcome Date GA Lbr Len/2nd Weight Sex Type Anes PTL Lv  3 Preterm 12/19/22 [redacted]w[redacted]d / 00:21 2750 g F Vag-Spont EPI  LIV  2 Term 06/26/21 [redacted]w[redacted]d / 00:15 3490 g M Vag-Spont EPI  LIV  1 Preterm 05/04/20 [redacted]w[redacted]d / 00:13 990 g F Vag-Spont EPI  FD    Prenatal care site: [facility]  Social History: She  reports that she has never smoked. She has never used smokeless tobacco. She reports that she does not currently use alcohol. She reports that she does not use drugs.  Family History: family history includes Breast cancer (age of onset: 84) in her mother;  Heart disease in her maternal grandmother; Hyperlipidemia in her father; Skin cancer in her maternal aunt; Stroke in her maternal grandmother.   Review of Systems  Constitutional: Negative.   HENT: Negative.    Eyes: Negative.   Respiratory: Negative.    Cardiovascular: Negative.   Gastrointestinal: Negative.   Genitourinary: Negative.   Musculoskeletal: Negative.   Skin: Negative.   Neurological:  Positive for headaches. Negative for seizures.  Psychiatric/Behavioral: Negative.       Physical Exam:  BP (!) 150s-160s/80s-100s   Pulse 72   Temp 98.7 F (37.1 C) (Oral)   Resp 16   Ht 5' 6.5" (1.689 m)   Wt 83.9 kg   LMP 04/09/2022   SpO2 96%   Breastfeeding Unknown   BMI 29.41 kg/m   Physical Exam Constitutional:      General: She is not in acute distress.    Appearance: Normal appearance. She is well-developed.  Genitourinary:     Genitourinary Comments: SVE: 1.5/50/-2  HENT:     Head: Normocephalic and atraumatic.  Eyes:     General: No scleral icterus.    Conjunctiva/sclera: Conjunctivae normal.  Cardiovascular:     Rate and Rhythm: Normal rate and regular rhythm.     Heart sounds: No murmur heard.    No friction rub. No gallop.  Pulmonary:     Effort: Pulmonary effort is normal. No respiratory distress.     Breath sounds: Normal breath sounds. No wheezing or rales.  Abdominal:     General:  Bowel sounds are normal. There is no distension.     Palpations: Abdomen is soft. There is mass (gravid, NT).     Tenderness: There is no abdominal tenderness. There is no guarding or rebound.  Musculoskeletal:        General: Normal range of motion.     Cervical back: Normal range of motion and neck supple.  Neurological:     General: No focal deficit present.     Mental Status: She is alert and oriented to person, place, and time.     Cranial Nerves: No cranial nerve deficit.  Skin:    General: Skin is warm and dry.     Findings: No erythema.  Psychiatric:        Mood and Affect: Mood normal.        Behavior: Behavior normal.        Judgment: Judgment normal.     Female chaperone present for pelvic exam:   Pertinent Results:  Prenatal Labs Blood type/Rh O positive  Antibody screen negative  Rubella Immune  Varicella Immune    RPR NR  HBsAg negative  HIV negative  HepC negative  GC negative  Chlamydia negative  Genetic screening negative  1 hour GTT 135  3 hour GTT 86, 152, 99, 104  GBS negative on 12/14/2022   Baseline FHR: 135 beats/min   Variability: moderate   Accelerations: present   Decelerations: absent Contractions: present frequency: irregular Overall assessment: cat 1  Bedside Ultrasound:  Number of Fetus: 1  Presentation: cephalic  Fluid: subjectively normal  Placental Location: posterior   Lab Results  Component Value Date   WBC 12.1 (H) 12/19/2022   HGB 13.6 12/19/2022   HCT 37.9 12/19/2022   PLT 116 (L) 12/19/2022   CREATININE 0.97 12/18/2022   ALT 17 12/18/2022   AST 22 12/18/2022   PROTCRRATIO 1.16 (H) 12/18/2022     Tanya Glenn is a 37 y.o. [id number] female at [redacted]w[redacted]d with preeclampsia with  severe features by severe pressures requiring treatment.  She did have resolution of her headache with additional tylenol. Additionally, she has new-onset proteinuria.   Plan:  Admit to Labor & Delivery  CBC, T&S, Clrs, IVF GBS negative.   Fetwal  well-being: reassuring Preeclampsia with severe features (by blood pressure and proteinuria).  Discussed my recommendation for induction of labor to which she agrees.  She has been explained the rationale for the use of magnesium sulfate for seizure prophylaxis and agrees to receive.   IOL method by misoprostol 25 mcg PV to start.   Thomasene Mohair, MD 12/19/2022 6:10 PM

## 2022-12-19 NOTE — Lactation Note (Signed)
This note was copied from a baby's chart. Lactation Consultation Note  Patient Name: Tanya Glenn Date: 12/19/2022 Age:38 hours Reason for consult: Initial assessment;Late-preterm 34-36.6wks;Infant < 6lbs;RN request;Breastfeeding assistance   Maternal Data Mom is a [id number], SVD. Mom  with history of narcolepsy, anxiety, depression, AMA, right and left breast biopsy(04/25/22), and hypothyroidism. Mom with preeclampsia with severe features.  On initial visit reviewed with mom late preterm "MY Care Plan", educational card placed on infant's basinet. Mom is an experienced breastfeeding mom and reports baby has been latching and breastfeeding. Mom also provides donor milk supplementation post breastfeeding. Mom has not started pumping. 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 Per mom her husband is bringing her pumps from home. Has patient been taught Hand Expression?: Yes Does the patient have breastfeeding experience prior to this delivery?: Yes How long did the patient breastfeed?: mom breastfed for 4 months and after returning to work she continued to pump and provide milk until baby was 75 years old.  Feeding Mother's Current Feeding Choice: Breast Milk Nipple Type: Slow - flow  LATCH Score Latch: Grasps breast easily, tongue down, lips flanged, rhythmical sucking.  Audible Swallowing: Spontaneous and intermittent  Type of Nipple: Everted at rest and after stimulation  Comfort (Breast/Nipple): Soft / non-tender  Hold (Positioning): No assistance needed to correctly position infant at breast.  LATCH Score: 10   Lactation Tools Discussed/Used:  Mom was agreeable to receive instructions on Medela pump set-up. Recommended mom post pump until her milk supply is established.  DEBP, how to set-up breastpump parts, cleaning of parts, and milk storage.  Interventions Interventions: DEBP;Education Reviewed LPT feeding plan. Mom verbalized understanding and is in  agreement of plan.  Discharge Pump: Personal;Hands Free (Mom has Spectra pump and  mom cozies. Discussed with mom use of the DEBP is recommended in the first 2 weeks to assist with establishing her milk supply. Discussed the wearable breastpump is for maintenance use once her milk supply is established.)  Consult Status Consult Status: Follow-up Date: 12/20/22 Follow-up type: In-patient  Update provided to care nurse  Fuller Song 12/19/2022, 7:23 PM

## 2022-12-20 ENCOUNTER — Encounter: Payer: Self-pay | Admitting: Obstetrics and Gynecology

## 2022-12-20 LAB — CBC
HCT: 36.9 % (ref 36.0–46.0)
Hemoglobin: 12.9 g/dL (ref 12.0–15.0)
MCH: 32.2 pg (ref 26.0–34.0)
MCHC: 35 g/dL (ref 30.0–36.0)
MCV: 92 fL (ref 80.0–100.0)
Platelets: 106 10*3/uL — ABNORMAL LOW (ref 150–400)
RBC: 4.01 MIL/uL (ref 3.87–5.11)
RDW: 13 % (ref 11.5–15.5)
WBC: 7.9 10*3/uL (ref 4.0–10.5)
nRBC: 0 % (ref 0.0–0.2)

## 2022-12-20 LAB — COMPREHENSIVE METABOLIC PANEL
ALT: 25 U/L (ref 0–44)
AST: 29 U/L (ref 15–41)
Albumin: 2.6 g/dL — ABNORMAL LOW (ref 3.5–5.0)
Alkaline Phosphatase: 105 U/L (ref 38–126)
Anion gap: 6 (ref 5–15)
BUN: 16 mg/dL (ref 6–20)
CO2: 25 mmol/L (ref 22–32)
Calcium: 7.6 mg/dL — ABNORMAL LOW (ref 8.9–10.3)
Chloride: 106 mmol/L (ref 98–111)
Creatinine, Ser: 0.97 mg/dL (ref 0.44–1.00)
GFR, Estimated: >60 mL/min (ref >60)
Glucose, Bld: 86 mg/dL (ref 70–99)
Potassium: 4.6 mmol/L (ref 3.5–5.1)
Sodium: 137 mmol/L (ref 135–145)
Total Bilirubin: 0.4 mg/dL (ref 0.3–1.2)
Total Protein: 5.6 g/dL — ABNORMAL LOW (ref 6.5–8.1)

## 2022-12-20 MED ORDER — NIFEDIPINE ER OSMOTIC RELEASE 30 MG PO TB24
60.0000 mg | ORAL_TABLET | Freq: Every day | ORAL | Status: DC
  Administered 2022-12-20: 60 mg via ORAL
  Filled 2022-12-20: qty 2

## 2022-12-20 MED ORDER — LEVOTHYROXINE SODIUM 25 MCG PO TABS
25.0000 ug | ORAL_TABLET | Freq: Every day | ORAL | Status: DC
  Administered 2022-12-20 – 2022-12-21 (×2): 25 ug via ORAL
  Filled 2022-12-20 (×2): qty 1

## 2022-12-20 MED ORDER — ALBUTEROL SULFATE HFA 108 (90 BASE) MCG/ACT IN AERS: 1.0000 | INHALATION_SPRAY | RESPIRATORY_TRACT | Status: DC | PRN

## 2022-12-20 NOTE — Progress Notes (Signed)
Post Partum Day 1 Subjective: Doing well, no complaints.  Tolerating regular diet, pain with PO meds, voiding and ambulating without difficulty.  No CP SOB Fever,Chills, N/V or leg pain; denies nipple or breast pain, no HA change of vision, RUQ/epigastric pain  Objective: BP 131/70 (BP Location: Left Arm)   Pulse 74   Temp 98.2 F (36.8 C) (Oral)   Resp 18   Ht 5' 6.5" (1.689 m)   Wt 83.9 kg   LMP 04/09/2022   SpO2 97%   Breastfeeding Unknown   BMI 29.41 kg/m  Vitals:   12/19/22 1831 12/19/22 1934 12/19/22 2029 12/19/22 2127  BP: (!) 145/88 (!) 140/89 (!) 142/82 (!) 137/92   12/19/22 2327 12/20/22 0042 12/20/22 0203 12/20/22 0305  BP: 128/75 (!) 143/88 (!) 145/90 (!) 143/90   12/20/22 0817 12/20/22 1208 12/20/22 1429 12/20/22 1613  BP: (!) 148/95 (!) 139/95 139/82 131/70      Physical Exam:  General: NAD Breasts: soft/nontender  CV: RRR Pulm: nl effort, CTABL Abdomen: soft, NT, BS x 4 Perineum:  minimal edema, intact Lochia: moderate Uterine Fundus: fundus firm and 1 fb below umbilicus DVT Evaluation: no cords, ttp LEs   Recent Labs    12/19/22 0755 12/20/22 0907  HGB 13.6 12.9  HCT 37.9 36.9  WBC 12.1* 7.9  PLT 116* 106*    Assessment/Plan: 37 y.o. [id number] postpartum day # 1  - Continue routine PP care - Lactation consult PRN  - Discussed contraceptive options including implant, IUDs hormonal and non-hormonal, injection, pills/ring/patch, condoms, and NFP.  - Pre-eclampsia with severe features: received 24hrs of magnesium, completed around 1am, Bps have been 130s-140s/70s-90s, Procardia increased from 30mg  XL to 60mg  XL. Repeat CBC tomorrow.  - Immunization status: all Imms up to date  Disposition: Desires d/c home today. Discussed the plan of care and possible discharge with Dr. Algis Downs. Schermerhorn and patient's blood pressures are still elevated so patient will stay overnight and likely discharge home tomorrow.   Janyce Llanos, CNM 12/20/2022 6:40  PM

## 2022-12-20 NOTE — Anesthesia Postprocedure Evaluation (Signed)
Anesthesia Post Note  Patient: Tanya Glenn  Procedure(s) Performed: AN AD HOC LABOR EPIDURAL  Patient location during evaluation: Mother Baby Anesthesia Type: Epidural Level of consciousness: awake and alert Pain management: pain level controlled Vital Signs Assessment: post-procedure vital signs reviewed and stable Respiratory status: spontaneous breathing, nonlabored ventilation and respiratory function stable Cardiovascular status: stable Postop Assessment: no headache, no backache and epidural receding Anesthetic complications: no   No notable events documented.   Last Vitals:  Vitals:   12/20/22 0203 12/20/22 0305  BP: (!) 145/90 (!) 143/90  Pulse: 88 74  Resp: 18 18  Temp:  36.6 C  SpO2:  100%    Last Pain:  Vitals:   12/20/22 0305  TempSrc: Oral  PainSc: 0-No pain                 Koen Antilla Joanette Gula

## 2022-12-20 NOTE — Lactation Note (Signed)
This note was copied from a baby's chart. Lactation Consultation Note  Patient Name: Tanya Glenn [id number] Date: 12/20/2022 Age:37 years Reason for consult: Follow-up assessment;Late-preterm 34-36.6wks;Other (Comment) (LPT care plan)   Maternal Data Mom is a [id number], SVD. Mom with history of narcolepsy, anxiety, depression, AMA, right and left breast biopsy(04/25/22), and hypothyroidism. Mom with preeclampsia with severe features.   On follow-up visit mom reports baby is latching and breastfeeding and also taking expressed breastmilk after each breastfeeding, Mom noted at the last feed baby was sucking and not swallowing when she became sleepier at the breast. Of note mom reported nipple was flat and compressed when baby detached. She reports her nipples are starting to feel sore. Mom with history of cracked nipples with first baby in which she had to pump for 2 weeks while her nipples healed. Mom reports she was tired and did not use the DEBP but used her mom cozies to express 15 ml's of colostrum. Per mom she will try today to use the DEBP. Mom report she is still having elevated blood pressures that are being managed and per mom she will likely not be discharged today.  Has patient been taught Hand Expression?: Yes Does the patient have breastfeeding experience prior to this delivery?: Yes How long did the patient breastfeed?: mom breastfed for 4 months and after returning to work she continued to pump and provide milk until baby was 11 years old.  Feeding Mother's Current Feeding Choice: Breast Milk Recommended mom continue to observe baby for active feeding with swallows. If baby is notactively feeding mom can provide some tactile stimulation and breast massage. If baby does not actively feed mom can detach baby , burp, and reevaluate to either offer baby back to breast or proceed with expressed breastmilk via bottle. Reviewed with mom importance of keeping feedings within a 30 minute  time frame(both breastfeeding and bottle feeding expressed breastmilk) to conserve babies energy.   Lactation Tools Discussed  Discussed with mom the use of mom cozies as a maintenance pump and recommended mom consider use of the DEBP while she is establishing her milk supply in the first 14 days when breastfeeding a late preterm baby.  Interventions Interventions: Education (REviewed LPT feeding plan.)  Discharge Pump: Hands Free;Personal  Consult Status Consult Status: Follow-up Date: 12/21/22 Follow-up type: In-patient  Update provided to care nurse.   Fuller Song 12/20/2022, 11:49 AM

## 2022-12-21 ENCOUNTER — Other Ambulatory Visit: Payer: Self-pay

## 2022-12-21 LAB — CBC
HCT: 32.8 % — ABNORMAL LOW (ref 36.0–46.0)
Hemoglobin: 11.2 g/dL — ABNORMAL LOW (ref 12.0–15.0)
MCH: 32.2 pg (ref 26.0–34.0)
MCHC: 34.1 g/dL (ref 30.0–36.0)
MCV: 94.3 fL (ref 80.0–100.0)
Platelets: 103 10*3/uL — ABNORMAL LOW (ref 150–400)
RBC: 3.48 MIL/uL — ABNORMAL LOW (ref 3.87–5.11)
RDW: 13 % (ref 11.5–15.5)
WBC: 8.4 10*3/uL (ref 4.0–10.5)
nRBC: 0 % (ref 0.0–0.2)

## 2022-12-21 MED ORDER — IBUPROFEN 600 MG PO TABS
600.0000 mg | ORAL_TABLET | Freq: Four times a day (QID) | ORAL | 0 refills | Status: AC
  Filled 2022-12-21: qty 30, 8d supply, fill #0

## 2022-12-21 MED ORDER — NIFEDIPINE ER OSMOTIC RELEASE 30 MG PO TB24
30.0000 mg | ORAL_TABLET | Freq: Every day | ORAL | 3 refills | Status: DC
  Filled 2022-12-21: qty 90, 90d supply, fill #0

## 2022-12-21 MED ORDER — BENZOCAINE-MENTHOL 20-0.5 % EX AERO: 1.0000 | INHALATION_SPRAY | CUTANEOUS | Status: DC | PRN

## 2022-12-21 MED ORDER — FERROUS SULFATE 325 (65 FE) MG PO TABS: 325.0000 mg | ORAL_TABLET | Freq: Two times a day (BID) | ORAL | Status: DC

## 2022-12-21 MED ORDER — NIFEDIPINE ER OSMOTIC RELEASE 30 MG PO TB24
90.0000 mg | ORAL_TABLET | Freq: Every day | ORAL | Status: DC
  Administered 2022-12-21: 90 mg via ORAL
  Filled 2022-12-21: qty 3

## 2022-12-21 MED ORDER — WITCH HAZEL-GLYCERIN EX PADS: 1.0000 | MEDICATED_PAD | CUTANEOUS | Status: DC | PRN

## 2022-12-21 MED ORDER — COCONUT OIL OIL: 1.0000 | TOPICAL_OIL | Status: DC | PRN

## 2022-12-21 MED ORDER — DIBUCAINE (PERIANAL) 1 % EX OINT: 1.0000 | TOPICAL_OINTMENT | CUTANEOUS | Status: DC | PRN

## 2022-12-21 NOTE — Discharge Instructions (Signed)

## 2022-12-21 NOTE — Lactation Note (Signed)
This note was copied from a baby's chart. Lactation Consultation Note  Patient Name: Tanya Glenn [id number] Date: 12/21/2022 Age:38 hours Reason for consult: Follow-up assessment;Late-preterm 34-36.6wks   Maternal Data Lactation to room for a follow up assessment/to provide discharge education.  Patient stated that infant has been using her as a paci and that her nipples are sore.  Per patient when she pumps with the Medela pump she doesn't get as much milk as she does with her Mom Cozy.   Feeding Mother's Current Feeding Choice: Breast Milk  No feeding observed.   Interventions Interventions: Breast feeding basics reviewed;Hand pump;Education;CDC Guidelines for Breast Pump Cleaning  Lactation provided some resources such as lanolin or coconut oil to apply to sore nipples.   Discharge Discharge Education: Engorgement and breast care;Outpatient recommendation  Education on engorgement prevention/treatment was discussed as well as breastmilk storage guidelines.  LC provided patient with a handout on breastmilk storage guidelines from [facility]. [facility] outpatient lactation services phone number written on the white board in the room.  Patient verbalized understanding   Consult Status Consult Status: Complete Follow-up type: Call as needed    Tanya Glenn Free 12/21/2022, 10:01 AM

## 2022-12-24 DIAGNOSIS — O1495 Unspecified pre-eclampsia, complicating the puerperium: Secondary | ICD-10-CM | POA: Diagnosis not present

## 2022-12-25 ENCOUNTER — Ambulatory Visit: Payer: 59 | Admitting: Cardiology

## 2023-01-02 DIAGNOSIS — O1495 Unspecified pre-eclampsia, complicating the puerperium: Secondary | ICD-10-CM | POA: Diagnosis not present

## 2023-01-04 ENCOUNTER — Other Ambulatory Visit: Payer: Self-pay

## 2023-01-15 DIAGNOSIS — E063 Autoimmune thyroiditis: Secondary | ICD-10-CM | POA: Diagnosis not present

## 2023-01-15 DIAGNOSIS — E079 Disorder of thyroid, unspecified: Secondary | ICD-10-CM | POA: Diagnosis not present

## 2023-01-15 DIAGNOSIS — O99283 Endocrine, nutritional and metabolic diseases complicating pregnancy, third trimester: Secondary | ICD-10-CM | POA: Diagnosis not present

## 2023-01-23 DIAGNOSIS — Z8639 Personal history of other endocrine, nutritional and metabolic disease: Secondary | ICD-10-CM | POA: Diagnosis not present

## 2023-01-30 DIAGNOSIS — Z1332 Encounter for screening for maternal depression: Secondary | ICD-10-CM | POA: Diagnosis not present

## 2023-02-14 DIAGNOSIS — H5203 Hypermetropia, bilateral: Secondary | ICD-10-CM | POA: Diagnosis not present

## 2023-02-25 ENCOUNTER — Encounter: Payer: Self-pay | Admitting: Family Medicine

## 2023-02-26 NOTE — Telephone Encounter (Signed)
 Can we get her scheduled to discuss given that she has been off the medication and symptoms are returning? Would be ok for a same day slot.

## 2023-02-27 ENCOUNTER — Other Ambulatory Visit: Payer: Self-pay

## 2023-02-27 ENCOUNTER — Ambulatory Visit: Payer: 59 | Admitting: Family Medicine

## 2023-02-27 VITALS — BP 124/82 | HR 68 | Temp 97.8°F | Ht 66.5 in | Wt 147.2 lb

## 2023-02-27 DIAGNOSIS — F419 Anxiety disorder, unspecified: Secondary | ICD-10-CM | POA: Diagnosis not present

## 2023-02-27 DIAGNOSIS — F32A Depression, unspecified: Secondary | ICD-10-CM | POA: Diagnosis not present

## 2023-02-27 MED ORDER — SERTRALINE HCL 50 MG PO TABS
ORAL_TABLET | ORAL | 1 refills | Status: AC
  Filled 2023-02-27: qty 76, 90d supply, fill #0

## 2023-02-27 NOTE — Progress Notes (Signed)
 Tanya Her, MD Phone: [phone]  Tanya Glenn is a 38 y.o. female who presents today for a visit.  Postpartum depression/anxiety: Patient notes onset of some postpartum anxiety and may be some depression.  Feels like hitting a wall at times.  No suicidal ideation.  She is breast-feeding.  She was on Zoloft  with Glenn last postpartum depression episode in did okay on it other than some drowsiness.  Social History   Tobacco Use  Smoking Status Never  Smokeless Tobacco Never    Current Outpatient Medications on File Prior to Visit  Medication Sig Dispense Refill   albuterol  (VENTOLIN  HFA) 108 (90 Base) MCG/ACT inhaler Inhale 1 puff into the lungs every 4 (four) hours as needed for wheezing or shortness of breath 6.7 g 2   beclomethasone (QVAR  REDIHALER) 40 MCG/ACT inhaler INHALE 2 PUFFS INTO THE LUNGS TWO TIMES DAILY 10.6 g 2   benzocaine -Menthol  (DERMOPLAST) 20-0.5 % AERO Apply 1 Application topically as needed for irritation (perineal discomfort).     coconut oil OIL Apply 1 Application topically as needed.     dibucaine (NUPERCAINAL) 1 % OINT Place 1 Application rectally as needed for hemorrhoids.     ferrous sulfate  325 (65 FE) MG tablet Take 1 tablet (325 mg total) by mouth 2 (two) times daily with a meal.     ibuprofen  (ADVIL ) 600 MG tablet Take 1 tablet (600 mg total) by mouth every 6 (six) hours. 30 tablet 0   levothyroxine  (SYNTHROID ) 25 MCG tablet Take 1 tablet (25 mcg total) by mouth daily. Take on an empty stomach with a glass of water at least 30-60 minutes before breakfast. 90 tablet 1   NIFEdipine  (PROCARDIA -XL/NIFEDICAL-XL) 30 MG 24 hr tablet Take 1 tablet (30 mg total) by mouth daily. 90 tablet 3   Prenatal MV-Min-Fe Fum-FA-DHA (PRENATAL 1 PO) Take by mouth.     witch hazel-glycerin  (TUCKS) pad Apply 1 Application topically as needed for hemorrhoids.     No current facility-administered medications on file prior to visit.     ROS see history of present  illness  Objective  Physical Exam Vitals:   02/27/23 1543  BP: 124/82  Pulse: 68  Temp: 97.8 F (36.6 C)  SpO2: 98%    BP Readings from Last 3 Encounters:  02/27/23 124/82  12/21/22 132/83  12/13/22 136/81   Wt Readings from Last 3 Encounters:  02/27/23 147 lb 3.2 oz (66.8 kg)  12/18/22 185 lb (83.9 kg)  11/05/22 170 lb 6.4 oz (77.3 kg)    Physical Exam Constitutional:      General: She is not in acute distress. Neurological:     Mental Status: She is alert.      Assessment/Plan: Please see individual problem list.  Anxiety and depression Assessment & Plan: Patient with some postpartum anxiety and depression.  Discussed use of Zoloft  25 mg daily for 4 weeks and then increasing to 50 mg daily.  To monitor for drowsiness and if she develops drowsiness when increasing the dose she can go back to the 25 mg dose.  Patient is aware that this does get into breastmilk and there is a small risk with breast-feeding on this medication though she also knows Zoloft  is one of the preferred options when breast-feeding.  She will follow-up in about 2 months.  Orders: -     Sertraline  HCl; Take 0.5 tablets (25 mg total) by mouth daily for 28 days, THEN 1 tablet (50 mg total) daily.  Dispense: 90 tablet; Refill: 1  Return in about 2 months (around 04/27/2023).   Tanya Her, MD [facility]

## 2023-02-27 NOTE — Assessment & Plan Note (Signed)
 Patient with some postpartum anxiety and depression.  Discussed use of Zoloft  25 mg daily for 4 weeks and then increasing to 50 mg daily.  To monitor for drowsiness and if she develops drowsiness when increasing the dose she can go back to the 25 mg dose.  Patient is aware that this does get into breastmilk and there is a small risk with breast-feeding on this medication though she also knows Zoloft  is one of the preferred options when breast-feeding.  She will follow-up in about 2 months.

## 2023-02-28 ENCOUNTER — Other Ambulatory Visit: Payer: Self-pay

## 2023-03-13 ENCOUNTER — Other Ambulatory Visit: Payer: Self-pay

## 2023-03-13 DIAGNOSIS — O905 Postpartum thyroiditis: Secondary | ICD-10-CM | POA: Diagnosis not present

## 2023-03-13 MED ORDER — METHIMAZOLE 5 MG PO TABS
5.0000 mg | ORAL_TABLET | Freq: Every day | ORAL | 3 refills | Status: AC
  Filled 2023-03-13: qty 30, 30d supply, fill #0
  Filled 2023-04-22: qty 30, 30d supply, fill #1

## 2023-04-09 DIAGNOSIS — O905 Postpartum thyroiditis: Secondary | ICD-10-CM | POA: Diagnosis not present

## 2023-04-30 ENCOUNTER — Ambulatory Visit: Payer: 59 | Admitting: Nurse Practitioner

## 2023-05-01 ENCOUNTER — Other Ambulatory Visit: Payer: Self-pay

## 2023-05-01 ENCOUNTER — Ambulatory Visit: Payer: 59 | Admitting: Nurse Practitioner

## 2023-05-01 ENCOUNTER — Encounter: Payer: Self-pay | Admitting: Nurse Practitioner

## 2023-05-01 VITALS — BP 110/72 | HR 60 | Temp 97.7°F | Ht 66.5 in | Wt 140.0 lb

## 2023-05-01 DIAGNOSIS — F419 Anxiety disorder, unspecified: Secondary | ICD-10-CM

## 2023-05-01 DIAGNOSIS — F32A Depression, unspecified: Secondary | ICD-10-CM | POA: Diagnosis not present

## 2023-05-01 MED ORDER — BUSPIRONE HCL 5 MG PO TABS
5.0000 mg | ORAL_TABLET | Freq: Two times a day (BID) | ORAL | 2 refills | Status: DC
  Filled 2023-05-01: qty 60, 30d supply, fill #0

## 2023-05-01 NOTE — Progress Notes (Signed)
 Bethanie Dicker, NP-C Phone: [phone]  Tanya Glenn is a 38 y.o. female who presents today for follow up on mood management.   Discussed the use of AI scribe software for clinical note transcription with the patient, who gave verbal consent to proceed.  History of Present Illness   The patient presents with postpartum anxiety and depression.  She has been experiencing more anxiety than depression since returning to work in January, characterized by racing thoughts and worst-case scenario thinking, particularly concerning her child's safety. Despite being on Zoloft 25 mg at bedtime for two months, her anxiety score remains high. She has not increased the dose to 50 mg due to concerns about sleepiness.  She is breastfeeding and primarily pumps due to returning to work full-time. She is concerned about the effects of medication on her infant and notes anxiety related to breastfeeding and pumping, suggesting a potential hormonal influence on her anxiety.  She mentions a thyroid disorder, which has improved, but she is unsure if the improvement is due to thyroid medication or Zoloft.  She has a history of postpartum anxiety and depression from a previous pregnancy, during which she was also on Zoloft. She has experienced significant personal loss, including the death of her daughter and mother, contributing to a persistent state of grief. She believes she has coping mechanisms for depression but struggles more with anxiety.      Social History   Tobacco Use  Smoking Status Never  Smokeless Tobacco Never    Current Outpatient Medications on File Prior to Visit  Medication Sig Dispense Refill   albuterol (VENTOLIN HFA) 108 (90 Base) MCG/ACT inhaler Inhale 1 puff into the lungs every 4 (four) hours as needed for wheezing or shortness of breath 6.7 g 2   beclomethasone (QVAR REDIHALER) 40 MCG/ACT inhaler INHALE 2 PUFFS INTO THE LUNGS TWO TIMES DAILY 10.6 g 2   ibuprofen (ADVIL) 600 MG tablet  Take 1 tablet (600 mg total) by mouth every 6 (six) hours. 30 tablet 0   levothyroxine (SYNTHROID) 25 MCG tablet Take 1 tablet (25 mcg total) by mouth daily. Take on an empty stomach with a glass of water at least 30-60 minutes before breakfast. 90 tablet 1   methimazole (TAPAZOLE) 5 MG tablet Take 1 tablet (5 mg total) by mouth daily. 30 tablet 3   Prenatal MV-Min-Fe Fum-FA-DHA (PRENATAL 1 PO) Take by mouth.     sertraline (ZOLOFT) 50 MG tablet Take 0.5 tablets (25 mg total) by mouth daily for 28 days, THEN 1 tablet (50 mg total) daily. 90 tablet 1   No current facility-administered medications on file prior to visit.    ROS see history of present illness  Objective  Physical Exam Vitals:   05/01/23 1303  BP: 110/72  Pulse: 60  Temp: 97.7 F (36.5 C)  SpO2: 99%    BP Readings from Last 3 Encounters:  05/01/23 110/72  02/27/23 124/82  12/21/22 132/83   Wt Readings from Last 3 Encounters:  05/01/23 140 lb (63.5 kg)  02/27/23 147 lb 3.2 oz (66.8 kg)  12/18/22 185 lb (83.9 kg)    Physical Exam Constitutional:      General: She is not in acute distress.    Appearance: Normal appearance.  HENT:     Head: Normocephalic.  Cardiovascular:     Rate and Rhythm: Normal rate and regular rhythm.     Heart sounds: Normal heart sounds.  Pulmonary:     Effort: Pulmonary effort is normal.  Breath sounds: Normal breath sounds.  Skin:    General: Skin is warm and dry.  Neurological:     General: No focal deficit present.     Mental Status: She is alert.  Psychiatric:        Mood and Affect: Mood is anxious.        Behavior: Behavior normal.     Assessment/Plan: Please see individual problem list.  Anxiety and depression Assessment & Plan: Persistent anxiety continues with high scores despite Zoloft 25 mg. She is hesitant to increase the dose due to concerns about sedation. Buspar was discussed, advised that there is limited human data available, weight risk/benefit.  Secure chat with Berenice Primas, Pharmacist agreed and noted that it is categorized as L3, probably compatible. Possible alternatives are also L3 rated and consist of Benzos which have other risks. We agreed to prescribe Buspar 5 mg twice daily. She will do her own research at home before deciding if she would like to start the medication. If she does start on the Buspar, she will monitor the baby for any side effects. Consider increasing Zoloft to 50 mg if not taking Buspar. A follow-up call in four weeks will assess progress with Buspar and Zoloft. Encouraged to contact if worsening symptoms, unusual behavior changes or suicidal thoughts occur.   Orders: -     busPIRone HCl; Take 1 tablet (5 mg total) by mouth 2 (two) times daily.  Dispense: 60 tablet; Refill: 2   Patient is transferring care to [facility] in June, follow up prior if needed.    Return if symptoms worsen or fail to improve.   Bethanie Dicker, NP-C [facility]

## 2023-05-01 NOTE — Assessment & Plan Note (Addendum)
 Persistent anxiety continues with high scores despite Zoloft 25 mg. She is hesitant to increase the dose due to concerns about sedation. Buspar was discussed, advised that there is limited human data available, weight risk/benefit. Secure chat with Berenice Primas, Pharmacist agreed and noted that it is categorized as L3, probably compatible. Possible alternatives are also L3 rated and consist of Benzos which have other risks. We agreed to prescribe Buspar 5 mg twice daily. She will do her own research at home before deciding if she would like to start the medication. If she does start on the Buspar, she will monitor the baby for any side effects. Consider increasing Zoloft to 50 mg if not taking Buspar. A follow-up call in four weeks will assess progress with Buspar and Zoloft. Encouraged to contact if worsening symptoms, unusual behavior changes or suicidal thoughts occur.

## 2023-05-06 ENCOUNTER — Other Ambulatory Visit: Payer: 59

## 2023-05-07 ENCOUNTER — Other Ambulatory Visit
Admission: RE | Admit: 2023-05-07 | Discharge: 2023-05-07 | Disposition: A | Discharge status: Home / Self Care | Payer: Self-pay | Source: Ambulatory Visit | Attending: Medical Genetics | Admitting: Medical Genetics

## 2023-05-07 DIAGNOSIS — Z006 Encounter for examination for normal comparison and control in clinical research program: Secondary | ICD-10-CM | POA: Insufficient documentation

## 2023-05-10 DIAGNOSIS — R946 Abnormal results of thyroid function studies: Secondary | ICD-10-CM | POA: Diagnosis not present

## 2023-05-10 DIAGNOSIS — M810 Age-related osteoporosis without current pathological fracture: Secondary | ICD-10-CM | POA: Diagnosis not present

## 2023-05-20 LAB — GENECONNECT MOLECULAR SCREEN: Genetic Analysis Overall Interpretation: NEGATIVE

## 2023-05-29 ENCOUNTER — Other Ambulatory Visit: Payer: Self-pay

## 2023-05-29 ENCOUNTER — Other Ambulatory Visit: Payer: Self-pay | Admitting: Nurse Practitioner

## 2023-05-29 DIAGNOSIS — F32A Depression, unspecified: Secondary | ICD-10-CM

## 2023-05-29 DIAGNOSIS — E063 Autoimmune thyroiditis: Secondary | ICD-10-CM | POA: Diagnosis not present

## 2023-05-29 MED ORDER — BUSPIRONE HCL 7.5 MG PO TABS
7.5000 mg | ORAL_TABLET | Freq: Two times a day (BID) | ORAL | 2 refills | Status: AC
  Filled 2023-05-29: qty 60, 30d supply, fill #0
  Filled 2023-07-16: qty 60, 30d supply, fill #1

## 2023-06-05 DIAGNOSIS — E063 Autoimmune thyroiditis: Secondary | ICD-10-CM | POA: Diagnosis not present

## 2023-06-07 ENCOUNTER — Other Ambulatory Visit: Payer: Self-pay | Admitting: Oncology

## 2023-06-07 DIAGNOSIS — Z1231 Encounter for screening mammogram for malignant neoplasm of breast: Secondary | ICD-10-CM

## 2023-06-11 ENCOUNTER — Ambulatory Visit
Admission: RE | Admit: 2023-06-11 | Discharge: 2023-06-11 | Disposition: A | Discharge status: Home / Self Care | Source: Ambulatory Visit | Attending: Oncology | Admitting: Oncology

## 2023-06-11 DIAGNOSIS — Z1231 Encounter for screening mammogram for malignant neoplasm of breast: Secondary | ICD-10-CM | POA: Insufficient documentation

## 2023-06-28 ENCOUNTER — Ambulatory Visit: Payer: 59 | Admitting: Oncology

## 2023-07-02 MED FILL — Albuterol Sulfate Inhal Aero 108 MCG/ACT (90MCG Base Equiv): RESPIRATORY_TRACT | 30 days supply | Qty: 6.7 | Fill #1 | Status: AC

## 2023-07-03 DIAGNOSIS — E063 Autoimmune thyroiditis: Secondary | ICD-10-CM | POA: Diagnosis not present

## 2023-08-12 ENCOUNTER — Other Ambulatory Visit: Payer: Self-pay

## 2023-08-12 DIAGNOSIS — G47429 Narcolepsy in conditions classified elsewhere without cataplexy: Secondary | ICD-10-CM | POA: Diagnosis not present

## 2023-08-12 DIAGNOSIS — E069 Thyroiditis, unspecified: Secondary | ICD-10-CM | POA: Diagnosis not present

## 2023-08-12 DIAGNOSIS — Z1331 Encounter for screening for depression: Secondary | ICD-10-CM | POA: Diagnosis not present

## 2023-08-12 DIAGNOSIS — Z0000 Encounter for general adult medical examination without abnormal findings: Secondary | ICD-10-CM | POA: Diagnosis not present

## 2023-08-12 DIAGNOSIS — F411 Generalized anxiety disorder: Secondary | ICD-10-CM | POA: Diagnosis not present

## 2023-08-12 DIAGNOSIS — E039 Hypothyroidism, unspecified: Secondary | ICD-10-CM | POA: Diagnosis not present

## 2023-08-12 MED ORDER — PROPRANOLOL HCL 10 MG PO TABS
10.0000 mg | ORAL_TABLET | Freq: Two times a day (BID) | ORAL | 0 refills | Status: AC | PRN
  Filled 2023-08-12: qty 60, 30d supply, fill #0

## 2023-08-12 MED ORDER — BUSPIRONE HCL 10 MG PO TABS
10.0000 mg | ORAL_TABLET | Freq: Two times a day (BID) | ORAL | 3 refills | Status: AC
  Filled 2023-08-12: qty 60, 30d supply, fill #0
  Filled 2023-09-14: qty 60, 30d supply, fill #1
  Filled 2023-10-21: qty 60, 30d supply, fill #2

## 2023-08-20 ENCOUNTER — Other Ambulatory Visit: Payer: Self-pay

## 2023-08-20 MED ORDER — LEVOTHYROXINE SODIUM 25 MCG PO TABS
25.0000 ug | ORAL_TABLET | Freq: Every day | ORAL | 1 refills | Status: DC
  Filled 2023-08-20: qty 90, 90d supply, fill #0
  Filled 2023-11-25: qty 90, 90d supply, fill #1

## 2023-08-27 DIAGNOSIS — Z0000 Encounter for general adult medical examination without abnormal findings: Secondary | ICD-10-CM | POA: Diagnosis not present

## 2023-08-27 DIAGNOSIS — E039 Hypothyroidism, unspecified: Secondary | ICD-10-CM | POA: Diagnosis not present

## 2023-08-27 DIAGNOSIS — F411 Generalized anxiety disorder: Secondary | ICD-10-CM | POA: Diagnosis not present

## 2023-09-12 ENCOUNTER — Encounter: Payer: Self-pay | Admitting: Emergency Medicine

## 2023-09-12 ENCOUNTER — Other Ambulatory Visit: Payer: Self-pay

## 2023-09-12 ENCOUNTER — Ambulatory Visit
Admission: EM | Admit: 2023-09-12 | Discharge: 2023-09-12 | Disposition: A | Discharge status: Home / Self Care | Attending: Emergency Medicine | Admitting: Emergency Medicine

## 2023-09-12 DIAGNOSIS — R21 Rash and other nonspecific skin eruption: Secondary | ICD-10-CM

## 2023-09-12 MED ORDER — PREDNISONE 20 MG PO TABS
40.0000 mg | ORAL_TABLET | Freq: Every day | ORAL | 0 refills | Status: AC
  Filled 2023-09-12: qty 10, 5d supply, fill #0

## 2023-09-12 NOTE — Discharge Instructions (Addendum)
 Your evaluated for your rash which appears inflammatory without signs of infection, typically infectious rashes occur with redness, swelling pain, skin hot to touch, possible drainage and fever  Begin oral prednisone  daily for 5 days, wait 5-hour period before breast-feeding  May continue oatmeal topical medicine, calamine or Benadryl  cream for management of itching and irritation  Avoid cold exposure to heat as this can cause further irritation to the skin  If your symptoms continue to persist you may follow-up with urgent care or your primary doctor for further evaluation

## 2023-09-12 NOTE — ED Provider Notes (Signed)
 [facility]    CSN: [id number] Arrival date & time: 09/12/23  1437      History   Chief Complaint Chief Complaint  Patient presents with   Rash    HPI Tanya Glenn is a 38 y.o. female.   Patient presents for evaluation of erythematous pruritic rash present underneath the chin for 7 days.  Rash become inflamed after showering today and able to feel a hardness to the center.  Denies drainage, fever.  Denies changes in toiletries, diet or recent travel, did attend a conference prior to symptoms beginning.  Has not occurred before.  Has attempted use of hydrocortisone cream which was ineffective and tried to be taught oatmeal lotion which has been helpful.   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   Family history of breast cancer 12/21/2020   Family history of gene mutation 12/21/2020   Hypothyroid in pregnancy, antepartum 12/19/2020   Varicose veins of leg with swelling, 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   Disorder of thyroid , unspecified 08/30/2020   Low TSH level 08/15/2020   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   Thrombocytopenia (HCC) 08/15/2020   Asthma during pregnancy 01/04/2020   BPPV (benign paroxysmal positional vertigo) 06/26/2017   GERD (gastroesophageal reflux disease) 06/26/2017   Narcolepsy 12/21/2016   Anxiety and depression 02/05/2012   Psoriasis of scalp 03/01/2011    Past Surgical History:   Procedure Laterality Date   BREAST BIOPSY Right 04/25/2022   Right Breast Stereo Bx, X clip path pending   BREAST BIOPSY Left 04/25/2022   Left Breast Stereo Bx, Coil clip - path pending   BREAST BIOPSY Right 04/25/2022   MM RT BREAST BX W LOC DEV 1ST LESION IMAGE BX SPEC STEREO GUIDE 04/25/2022 [facility]   BREAST BIOPSY Left 04/25/2022   MM LT BREAST BX W LOC DEV 1ST LESION IMAGE BX SPEC STEREO GUIDE 04/25/2022 [facility]   WISDOM TOOTH EXTRACTION      OB History     Gravida  3   Para  3   Term  1   Preterm  2   AB  0   Living  2      SAB  0   IAB  0   Ectopic  0   Multiple  0   Live Births  2            Home Medications    Prior to Admission medications   Medication Sig Start Date End Date Taking? Authorizing Provider  predniSONE  (DELTASONE ) 20 MG tablet Take 2 tablets (40 mg total) by mouth daily. 09/12/23  Yes Antwan Bribiesca, Shelba SAUNDERS, NP  albuterol  (VENTOLIN  HFA) 108 (90 Base) MCG/ACT inhaler Inhale 1 puff into the lungs every 4 (four) hours as needed for wheezing or shortness of breath 08/28/22 08/28/23  Maribeth Camellia MATSU, MD  beclomethasone (QVAR  REDIHALER) 40 MCG/ACT inhaler INHALE 2 PUFFS INTO THE LUNGS TWO  TIMES DAILY 11/29/21   Maribeth Camellia MATSU, MD  busPIRone  (BUSPAR ) 10 MG tablet Take 1 tablet (10 mg total) by mouth 2 (two) times daily 08/12/23     busPIRone  (BUSPAR ) 7.5 MG tablet Take 1 tablet (7.5 mg total) by mouth 2 (two) times daily. 05/29/23   Gretel App, NP  ibuprofen  (ADVIL ) 600 MG tablet Take 1 tablet (600 mg total) by mouth every 6 (six) hours. 12/21/22   Liboon, Jazmine, CNM  levothyroxine  (SYNTHROID ) 25 MCG tablet Take 1 tablet (25 mcg total) by mouth daily. Take on an empty stomach with a glass of water at least 30-60 minutes before breakfast. 08/20/23     methimazole  (TAPAZOLE ) 5 MG tablet Take 1 tablet (5 mg total) by mouth daily. 03/13/23     Prenatal MV-Min-Fe Fum-FA-DHA (PRENATAL 1 PO) Take by mouth.    [provider]   propranolol  (INDERAL ) 10 MG tablet Take 1 tablet (10 mg total) by mouth 2 (two) times daily as needed for anxiety. 08/12/23     sertraline  (ZOLOFT ) 50 MG tablet Take 0.5 tablets (25 mg total) by mouth daily for 28 days, THEN 1 tablet (50 mg total) daily. 02/27/23 06/25/23  Maribeth Camellia MATSU, MD    Family History Family History  Problem Relation Age of Onset   Breast cancer Mother 87       triple negative, BRIP1+   Hyperlipidemia Father    Skin cancer Maternal Aunt    Heart disease Maternal Grandmother    Stroke Maternal Grandmother     Social History Social History   Tobacco Use   Smoking status: Never   Smokeless tobacco: Never  Vaping Use   Vaping status: Never Used  Substance Use Topics   Alcohol use: Not Currently    Comment: occasional drink on the weekend.   Drug use: No     Allergies   Sulfa antibiotics   Review of Systems Review of Systems  Skin:  Positive for rash.     Physical Exam Triage Vital Signs ED Triage Vitals  Encounter Vitals Group     BP 09/12/23 1454 122/80     Girls Systolic BP Percentile --      Girls Diastolic BP Percentile --      Boys Systolic BP Percentile --      Boys Diastolic BP Percentile --      Pulse Rate 09/12/23 1454 76     Resp 09/12/23 1454 18     Temp 09/12/23 1454 98.7 F (37.1 C)     Temp Source 09/12/23 1454 Oral     SpO2 09/12/23 1454 100 %     Weight --      Height --      Head Circumference --      Peak Flow --      Pain Score 09/12/23 1457 0     Pain Loc --      Pain Education --      Exclude from Growth Chart --    No data found.  Updated Vital Signs BP 122/80 (BP Location: Left Arm)   Pulse 76   Temp 98.7 F (37.1 C) (Oral)   Resp 18   LMP 07/21/2023   SpO2 100%   Breastfeeding Yes   Visual Acuity Right Eye Distance:   Left Eye Distance:   Bilateral Distance:    Right Eye Near:   Left Eye Near:    Bilateral Near:     Physical Exam Constitutional:  Appearance: Normal appearance.   Eyes:     Extraocular Movements: Extraocular movements intact.  Pulmonary:     Effort: Pulmonary effort is normal.  Skin:    Comments: Erythematous macular rash present underneath the chin, slight redness to the center, not consistent with abscess, no drainage noted  Neurological:     Mental Status: She is alert and oriented to person, place, and time.      UC Treatments / Results  Labs (all labs ordered are listed, but only abnormal results are displayed) Labs Reviewed - No data to display  EKG   Radiology No results found.  Procedures Procedures (including critical care time)  Medications Ordered in UC Medications - No data to display  Initial Impression / Assessment and Plan / UC Course  I have reviewed the triage vital signs and the nursing notes.  Pertinent labs & imaging results that were available during my care of the patient were reviewed by me and considered in my medical decision making (see chart for details).  Rash  Appears inflammatory, at this time no signs of infection, discussed this with patient, improving with use of oatmeal, prescribed prednisone , currently breast-feeding, advised avoidance of breast-feeding for at least 5 hours post administration, verbalized understanding, recommended supportive care and advised follow-up with primary doctor if symptoms persist Final Clinical Impressions(s) / UC Diagnoses   Final diagnoses:  Rash and nonspecific skin eruption     Discharge Instructions      Your evaluated for your rash which appears inflammatory without signs of infection, typically infectious rashes occur with redness, swelling pain, skin hot to touch, possible drainage and fever  Begin oral prednisone  daily for 5 days, wait 5-hour period before breast-feeding  May continue oatmeal topical medicine, calamine or Benadryl  cream for management of itching and irritation  Avoid cold exposure to heat as this can cause further irritation to the  skin  If your symptoms continue to persist you may follow-up with urgent care or your primary doctor for further evaluation   ED Prescriptions     Medication Sig Dispense Auth. Provider   predniSONE  (DELTASONE ) 20 MG tablet Take 2 tablets (40 mg total) by mouth daily. 10 tablet Crystel Demarco R, NP      PDMP not reviewed this encounter.   Teresa Shelba SAUNDERS, [place] 09/12/23 [phone]

## 2023-09-12 NOTE — ED Triage Notes (Addendum)
 Patient reports red itchy rash under chin that started last Friday. Patient used Hydrocortisone for 3 days with no improvement. Felt like cream made rash worse. Denies pain.

## 2023-10-14 ENCOUNTER — Other Ambulatory Visit: Payer: Self-pay

## 2023-10-14 MED ORDER — FLUTICASONE PROPIONATE HFA 44 MCG/ACT IN AERO
2.0000 | INHALATION_SPRAY | Freq: Two times a day (BID) | RESPIRATORY_TRACT | 12 refills | Status: AC
  Filled 2023-10-14: qty 10.6, 30d supply, fill #0
  Filled 2023-10-14: qty 21.2, 60d supply, fill #0

## 2023-10-15 ENCOUNTER — Encounter: Payer: Self-pay | Admitting: Oncology

## 2023-10-15 ENCOUNTER — Inpatient Hospital Stay: Attending: Oncology | Admitting: Oncology

## 2023-10-15 VITALS — BP 120/74 | HR 70 | Temp 96.8°F | Resp 18 | Ht 66.5 in | Wt 139.2 lb

## 2023-10-15 DIAGNOSIS — Z808 Family history of malignant neoplasm of other organs or systems: Secondary | ICD-10-CM | POA: Diagnosis not present

## 2023-10-15 DIAGNOSIS — Z1231 Encounter for screening mammogram for malignant neoplasm of breast: Secondary | ICD-10-CM | POA: Diagnosis not present

## 2023-10-15 DIAGNOSIS — Z803 Family history of malignant neoplasm of breast: Secondary | ICD-10-CM | POA: Diagnosis not present

## 2023-10-15 DIAGNOSIS — Z9189 Other specified personal risk factors, not elsewhere classified: Secondary | ICD-10-CM | POA: Insufficient documentation

## 2023-10-15 DIAGNOSIS — Z1239 Encounter for other screening for malignant neoplasm of breast: Secondary | ICD-10-CM | POA: Diagnosis not present

## 2023-10-21 NOTE — Progress Notes (Signed)
 Hematology/Oncology Consult note [facility]  [phone] [phone]  Patient Care Team: Tobb, Kardie, DO as PCP - Cardiology (Cardiology) Caleen Ileana BIRCH, NT as Technician Melanee Annah BROCKS, MD as Consulting Physician (Oncology)   Name of the patient: Tanya Glenn  [id number]  Feb 14, 1986   Date of visit: 10/21/23  Diagnosis- at high risk for breast cancer  Chief complaint/ Reason for visit-preventative visit for family history of breast cancer  Heme/Onc history:  patient is a 38 year old female whose mother was found to have triple negative breast cancer at the age of 22 and subsequently diagnosed with BRIP1 gene mutation.  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  Patient has been seen by genetic counseling and given her increased risk of ovarian cancer and possible breast cancer she has been referred to high risk clinic at this time.   Patient is G2P1. She has had problems conceiving. She had a still birth with previous pregnancy. Menarche at the age of 31.  She has used birth control in the past for 18 years.  No prior breast biopsies or mammograms.She has 1 sister with no history of cancer    Interval history-patient delivered on eventfully in November 2024.  Presently she is doing well and denies any breast concerns.  She will stop breast-feeding soon.  She is contemplating attempting third pregnancy given that she has 1 leftover embryo from IVF.  This may potentially happen next year.  ECOG PS- 0 Pain scale- 0   Review of systems- Review of Systems  Constitutional:  Negative for chills, fever, malaise/fatigue and weight loss.  HENT:  Negative for congestion, ear discharge and nosebleeds.   Eyes:  Negative for blurred vision.  Respiratory:  Negative for cough, hemoptysis, sputum production, shortness of breath and wheezing.   Cardiovascular:  Negative for  chest pain, palpitations, orthopnea and claudication.  Gastrointestinal:  Negative for abdominal pain, blood in stool, constipation, diarrhea, heartburn, melena, nausea and vomiting.  Genitourinary:  Negative for dysuria, flank pain, frequency, hematuria and urgency.  Musculoskeletal:  Negative for back pain, joint pain and myalgias.  Skin:  Negative for rash.  Neurological:  Negative for dizziness, tingling, focal weakness, seizures, weakness and headaches.  Endo/Heme/Allergies:  Does not bruise/bleed easily.  Psychiatric/Behavioral:  Negative for depression and suicidal ideas. The patient does not have insomnia.       Allergies  Allergen Reactions   Sulfa Antibiotics Hives     Past Medical History:  Diagnosis Date   Anxiety    Asthma    Asthma    Depression    Family history of breast cancer    Family history of gene mutation    Narcolepsy    Symptomatic PVCs      Past Surgical History:  Procedure Laterality Date   BREAST BIOPSY Right 04/25/2022   Right Breast Stereo Bx, X clip path pending   BREAST BIOPSY Left 04/25/2022   Left Breast Stereo Bx, Coil clip - path pending   BREAST BIOPSY Right 04/25/2022   MM RT BREAST BX W LOC DEV 1ST LESION IMAGE BX SPEC STEREO GUIDE 04/25/2022 [facility]   BREAST BIOPSY Left 04/25/2022   MM LT BREAST BX W LOC DEV 1ST LESION IMAGE BX SPEC STEREO GUIDE 04/25/2022 [facility]   WISDOM TOOTH EXTRACTION      Social History   Socioeconomic History   Marital status: Married    Spouse name:  Curtis Cribbem   Number of children: Not on file   Years of education: Not on file   Highest education level: Professional school degree (e.g., MD, DDS, DVM, JD)  Occupational History   Occupation: Pharmacist  Tobacco Use   Smoking status: Never   Smokeless tobacco: Never  Vaping Use   Vaping status: Never Used  Substance and Sexual Activity   Alcohol use: Not Currently    Comment: occasional drink on the weekend.   Drug use: No    Sexual activity: Yes    Birth control/protection: None  Other Topics Concern   Not on file  Social History Narrative   Lives in [place].  Exercises - wt training - 4+ x /wk.     Social Drivers of Corporate investment banker Strain: Low Risk  (08/12/2023)   Received [facility]   Overall Financial Resource Strain (CARDIA)    Difficulty of Paying Living Expenses: Not hard at all  Food Insecurity: No Food Insecurity (08/12/2023)   [facility]   Hunger Vital Sign    Within the past 12 months, you worried that your food would run out before you got the money to buy more.: Never true    Within the past 12 months, the food you bought just didn't last and you didn't have money to get more.: Never true  Transportation Needs: No Transportation Needs (08/12/2023)   Received from [facility] Transportation    In the past 12 months, has lack of transportation kept you from medical appointments or from getting medications?: No    Lack of Transportation (Non-Medical): No  Physical Activity: Insufficiently Active (02/26/2023)   Exercise Vital Sign    Days of Exercise per Week: 1 day    Minutes of Exercise per Session: 20 min  Stress: Stress Concern Present (02/26/2023)   [employer] of Occupational Health - Occupational Stress Questionnaire    Feeling of Stress : Rather much  Social Connections: Socially Integrated (02/26/2023)   Social Connection and Isolation Panel    Frequency of Communication with Friends and Family: More than three times a week    Frequency of Social Gatherings with Friends and Family: Once a week    Attends Religious Services: More than 4 times per year    Active Member of [employer] or Organizations: Yes    Attends Engineer, structural: More than 4 times per year    Marital Status: Married  Catering manager Violence: Not At Risk (12/18/2022)   Humiliation, Afraid, Rape, and Kick  questionnaire    Fear of Current or Ex-Partner: No    Emotionally Abused: No    Physically Abused: No    Sexually Abused: No    Family History  Problem Relation Age of Onset   Breast cancer Mother 45       triple negative, BRIP1+   Hyperlipidemia Father    Skin cancer Maternal Aunt    Heart disease Maternal Grandmother    Stroke Maternal Grandmother      Current Outpatient Medications:    albuterol  (VENTOLIN  HFA) 108 (90 Base) MCG/ACT inhaler, Inhale 1 puff into the lungs every 4 (four) hours as needed for wheezing or shortness of breath, Disp: 6.7 g, Rfl: 2   busPIRone  (BUSPAR ) 10 MG tablet, Take 1 tablet (10 mg total) by mouth 2 (two) times daily, Disp: 60 tablet, Rfl: 3   fluticasone  (FLOVENT  HFA) 44 MCG/ACT inhaler, Inhale  2 puffs into the lungs 2 (two) times daily., Disp: 10.6 g, Rfl: 12   levothyroxine  (SYNTHROID ) 25 MCG tablet, Take 1 tablet (25 mcg total) by mouth daily. Take on an empty stomach with a glass of water at least 30-60 minutes before breakfast., Disp: 90 tablet, Rfl: 1   Prenatal MV-Min-Fe Fum-FA-DHA (PRENATAL 1 PO), Take by mouth., Disp: , Rfl:    propranolol  (INDERAL ) 10 MG tablet, Take 1 tablet (10 mg total) by mouth 2 (two) times daily as needed for anxiety., Disp: 60 tablet, Rfl: 0   beclomethasone (QVAR  REDIHALER) 40 MCG/ACT inhaler, INHALE 2 PUFFS INTO THE LUNGS TWO TIMES DAILY (Patient not taking: Reported on 10/15/2023), Disp: 10.6 g, Rfl: 2   busPIRone  (BUSPAR ) 7.5 MG tablet, Take 1 tablet (7.5 mg total) by mouth 2 (two) times daily., Disp: 60 tablet, Rfl: 2   ibuprofen  (ADVIL ) 600 MG tablet, Take 1 tablet (600 mg total) by mouth every 6 (six) hours., Disp: 30 tablet, Rfl: 0   methimazole  (TAPAZOLE ) 5 MG tablet, Take 1 tablet (5 mg total) by mouth daily., Disp: 30 tablet, Rfl: 3   predniSONE  (DELTASONE ) 20 MG tablet, Take 2 tablets (40 mg total) by mouth daily., Disp: 10 tablet, Rfl: 0   sertraline  (ZOLOFT ) 50 MG tablet, Take 0.5 tablets (25 mg total) by  mouth daily for 28 days, THEN 1 tablet (50 mg total) daily., Disp: 90 tablet, Rfl: 1  Physical exam:  Vitals:   10/15/23 1520 10/15/23 1527  BP: (!) 111/45 120/74  Pulse: 70   Resp: 18   Temp: (!) 96.8 F (36 C)   TempSrc: Tympanic   SpO2: 100%   Weight: 139 lb 3.2 oz (63.1 kg)   Height: 5' 6.5 (1.689 m)    Physical Exam Cardiovascular:     Rate and Rhythm: Normal rate and regular rhythm.     Heart sounds: Normal heart sounds.  Skin:    General: Skin is warm and dry.  Neurological:     Mental Status: She is alert and oriented to person, place, and time.   Breast exam: No palpable masses in either breast.  No palpable bilateral axillary adenopathy.  I have personally reviewed labs listed below:    Latest Ref Rng & Units 12/20/2022    9:07 AM  CMP  Glucose 70 - 99 mg/dL 86   BUN 6 - 20 mg/dL 16   Creatinine 9.55 - 1.00 mg/dL 9.02   Sodium 864 - 854 mmol/L 137   Potassium 3.5 - 5.1 mmol/L 4.6   Chloride 98 - 111 mmol/L 106   CO2 22 - 32 mmol/L 25   Calcium  8.9 - 10.3 mg/dL 7.6   Total Protein 6.5 - 8.1 g/dL 5.6   Total Bilirubin 0.3 - 1.2 mg/dL 0.4   Alkaline Phos 38 - 126 U/L 105   AST 15 - 41 U/L 29   ALT 0 - 44 U/L 25       Latest Ref Rng & Units 12/21/2022    6:27 AM  CBC  WBC 4.0 - 10.5 K/uL 8.4   Hemoglobin 12.0 - 15.0 g/dL 88.7   Hematocrit 63.9 - 46.0 % 32.8   Platelets 150 - 400 K/uL 103    Assessment and plan- Patient is a 38 y.o. female with personal history of BRCA1 gene mutation and family history of breast cancer here for a routine annual checkup  Clinically patient is doing well and no concerning breast findings on todays exam.  Given that she  has history of BRCA1 gene mutation and her mother had breast cancer I would like to get MRIs alternating with mammograms.  Her mammogram from April 2025 was unremarkable.  Patient would like to get her MRI scheduled in November after she completely stops breast-feeding.  Will also schedule mammogram for April  of next year and I will see her back in 1 year   Visit Diagnosis 1. Breast cancer screening, high risk patient   2. Visit for screening mammogram      Dr. Annah Skene, MD, MPH [facility] [phone] 10/21/2023 8:09 PM

## 2023-11-04 ENCOUNTER — Other Ambulatory Visit: Payer: Self-pay

## 2023-11-04 MED ORDER — PENICILLIN V POTASSIUM 500 MG PO TABS
500.0000 mg | ORAL_TABLET | Freq: Four times a day (QID) | ORAL | 0 refills | Status: AC
  Filled 2023-11-04: qty 28, 7d supply, fill #0

## 2023-11-06 DIAGNOSIS — G47411 Narcolepsy with cataplexy: Secondary | ICD-10-CM | POA: Diagnosis not present

## 2023-11-11 ENCOUNTER — Encounter: Payer: 59 | Admitting: Family Medicine

## 2023-11-25 ENCOUNTER — Other Ambulatory Visit: Payer: Self-pay

## 2023-11-25 DIAGNOSIS — G47429 Narcolepsy in conditions classified elsewhere without cataplexy: Secondary | ICD-10-CM | POA: Diagnosis not present

## 2023-11-25 DIAGNOSIS — E039 Hypothyroidism, unspecified: Secondary | ICD-10-CM | POA: Diagnosis not present

## 2023-11-25 DIAGNOSIS — F411 Generalized anxiety disorder: Secondary | ICD-10-CM | POA: Diagnosis not present

## 2023-11-25 MED ORDER — BUSPIRONE HCL 10 MG PO TABS
10.0000 mg | ORAL_TABLET | Freq: Two times a day (BID) | ORAL | 3 refills | Status: AC
  Filled 2023-11-25: qty 60, 30d supply, fill #0
  Filled 2024-01-15: qty 60, 30d supply, fill #1
  Filled 2024-02-16: qty 60, 30d supply, fill #2

## 2024-01-03 ENCOUNTER — Ambulatory Visit
Admission: RE | Admit: 2024-01-03 | Discharge: 2024-01-03 | Disposition: A | Discharge status: Home / Self Care | Source: Ambulatory Visit | Attending: Oncology | Admitting: Oncology

## 2024-01-03 DIAGNOSIS — Z1239 Encounter for other screening for malignant neoplasm of breast: Secondary | ICD-10-CM | POA: Diagnosis not present

## 2024-01-03 DIAGNOSIS — Z0389 Encounter for observation for other suspected diseases and conditions ruled out: Secondary | ICD-10-CM | POA: Diagnosis not present

## 2024-01-03 MED ORDER — GADOBUTROL 1 MMOL/ML IV SOLN
6.0000 mL | Freq: Once | INTRAVENOUS | Status: AC | PRN
  Administered 2024-01-03: 6 mL via INTRAVENOUS

## 2024-01-09 DIAGNOSIS — E063 Autoimmune thyroiditis: Secondary | ICD-10-CM | POA: Diagnosis not present

## 2024-01-10 DIAGNOSIS — E063 Autoimmune thyroiditis: Secondary | ICD-10-CM | POA: Diagnosis not present

## 2024-02-16 ENCOUNTER — Other Ambulatory Visit: Payer: Self-pay

## 2024-02-17 ENCOUNTER — Other Ambulatory Visit: Payer: Self-pay

## 2024-02-18 ENCOUNTER — Other Ambulatory Visit: Payer: Self-pay

## 2024-02-18 MED ORDER — LEVOTHYROXINE SODIUM 25 MCG PO TABS
25.0000 ug | ORAL_TABLET | Freq: Every day | ORAL | 1 refills | Status: AC
  Filled 2024-02-18: qty 90, 90d supply, fill #0

## 2024-02-19 ENCOUNTER — Other Ambulatory Visit: Payer: Self-pay

## 2024-03-02 ENCOUNTER — Other Ambulatory Visit: Payer: Self-pay

## 2024-03-02 MED ORDER — TRETINOIN 0.025 % EX CREA
TOPICAL_CREAM | CUTANEOUS | 0 refills | Status: AC
  Filled 2024-03-02: qty 45, 30d supply, fill #0

## 2024-10-14 ENCOUNTER — Ambulatory Visit: Admitting: Oncology
